# Patient Record
Sex: Female | Born: 1935 | Race: White | Hispanic: No | Marital: Married | Smoking: Never smoker
Health system: Southern US, Community
[De-identification: ages and names within clinical notes are randomized; demographics above are authoritative.]

## PROBLEM LIST (undated history)

## (undated) DIAGNOSIS — C089 Malignant neoplasm of major salivary gland, unspecified: Secondary | ICD-10-CM

## (undated) DIAGNOSIS — E785 Hyperlipidemia, unspecified: Secondary | ICD-10-CM

## (undated) DIAGNOSIS — M8580 Other specified disorders of bone density and structure, unspecified site: Secondary | ICD-10-CM

## (undated) DIAGNOSIS — C3490 Malignant neoplasm of unspecified part of unspecified bronchus or lung: Secondary | ICD-10-CM

## (undated) DIAGNOSIS — I10 Essential (primary) hypertension: Secondary | ICD-10-CM

## (undated) DIAGNOSIS — M1990 Unspecified osteoarthritis, unspecified site: Secondary | ICD-10-CM

## (undated) DIAGNOSIS — H9192 Unspecified hearing loss, left ear: Secondary | ICD-10-CM

## (undated) HISTORY — DX: Malignant neoplasm of unspecified part of unspecified bronchus or lung: C34.90

## (undated) HISTORY — DX: Unspecified hearing loss, left ear: H91.92

## (undated) HISTORY — PX: OTHER SURGICAL HISTORY: SHX169

## (undated) HISTORY — DX: Essential (primary) hypertension: I10

## (undated) HISTORY — DX: Hyperlipidemia, unspecified: E78.5

## (undated) HISTORY — PX: COLONOSCOPY: SHX174

## (undated) HISTORY — PX: CHOLECYSTECTOMY: SHX55

## (undated) HISTORY — DX: Malignant neoplasm of major salivary gland, unspecified: C08.9

## (undated) HISTORY — DX: Other specified disorders of bone density and structure, unspecified site: M85.80

## (undated) HISTORY — DX: Unspecified osteoarthritis, unspecified site: M19.90

## (undated) HISTORY — PX: CARDIAC VALVE REPLACEMENT: SHX585

---

## 1998-01-10 ENCOUNTER — Inpatient Hospital Stay (HOSPITAL_COMMUNITY): Admission: AD | Admit: 1998-01-10 | Discharge: 1998-01-12 | Payer: Self-pay | Admitting: *Deleted

## 1998-10-09 ENCOUNTER — Other Ambulatory Visit: Admission: RE | Admit: 1998-10-09 | Discharge: 1998-10-09 | Payer: Self-pay | Admitting: *Deleted

## 1999-12-30 ENCOUNTER — Encounter: Admission: RE | Admit: 1999-12-30 | Discharge: 1999-12-30 | Payer: Self-pay | Admitting: *Deleted

## 1999-12-30 ENCOUNTER — Encounter: Payer: Self-pay | Admitting: *Deleted

## 2000-09-21 ENCOUNTER — Ambulatory Visit (HOSPITAL_COMMUNITY): Admission: RE | Admit: 2000-09-21 | Discharge: 2000-09-21 | Payer: Self-pay | Admitting: Internal Medicine

## 2000-09-21 ENCOUNTER — Encounter: Payer: Self-pay | Admitting: Internal Medicine

## 2001-01-05 ENCOUNTER — Encounter: Admission: RE | Admit: 2001-01-05 | Discharge: 2001-01-05 | Payer: Self-pay | Admitting: Internal Medicine

## 2001-01-05 ENCOUNTER — Encounter: Payer: Self-pay | Admitting: Internal Medicine

## 2001-04-23 ENCOUNTER — Other Ambulatory Visit: Admission: RE | Admit: 2001-04-23 | Discharge: 2001-04-23 | Payer: Self-pay | Admitting: Neurosurgery

## 2002-01-06 ENCOUNTER — Encounter: Payer: Self-pay | Admitting: Internal Medicine

## 2002-01-06 ENCOUNTER — Encounter: Admission: RE | Admit: 2002-01-06 | Discharge: 2002-01-06 | Payer: Self-pay | Admitting: Internal Medicine

## 2002-08-11 ENCOUNTER — Other Ambulatory Visit: Admission: RE | Admit: 2002-08-11 | Discharge: 2002-08-11 | Payer: Self-pay | Admitting: Internal Medicine

## 2002-10-01 ENCOUNTER — Emergency Department (HOSPITAL_COMMUNITY): Admission: EM | Admit: 2002-10-01 | Discharge: 2002-10-01 | Payer: Self-pay | Admitting: Emergency Medicine

## 2002-10-01 ENCOUNTER — Encounter: Payer: Self-pay | Admitting: Emergency Medicine

## 2002-10-20 ENCOUNTER — Ambulatory Visit (HOSPITAL_BASED_OUTPATIENT_CLINIC_OR_DEPARTMENT_OTHER): Admission: RE | Admit: 2002-10-20 | Discharge: 2002-10-20 | Payer: Self-pay | Admitting: Urology

## 2003-01-12 ENCOUNTER — Encounter: Payer: Self-pay | Admitting: Internal Medicine

## 2003-01-12 ENCOUNTER — Encounter: Admission: RE | Admit: 2003-01-12 | Discharge: 2003-01-12 | Payer: Self-pay | Admitting: Internal Medicine

## 2004-01-15 ENCOUNTER — Encounter: Admission: RE | Admit: 2004-01-15 | Discharge: 2004-01-15 | Payer: Self-pay | Admitting: Internal Medicine

## 2004-12-23 ENCOUNTER — Ambulatory Visit: Payer: Self-pay | Admitting: Internal Medicine

## 2005-01-30 ENCOUNTER — Encounter: Admission: RE | Admit: 2005-01-30 | Discharge: 2005-01-30 | Payer: Self-pay | Admitting: Internal Medicine

## 2005-03-17 ENCOUNTER — Ambulatory Visit: Payer: Self-pay | Admitting: Internal Medicine

## 2005-04-10 ENCOUNTER — Other Ambulatory Visit: Admission: RE | Admit: 2005-04-10 | Discharge: 2005-04-10 | Payer: Self-pay | Admitting: Internal Medicine

## 2005-04-10 ENCOUNTER — Ambulatory Visit: Payer: Self-pay | Admitting: Internal Medicine

## 2005-10-13 ENCOUNTER — Ambulatory Visit: Payer: Self-pay | Admitting: Internal Medicine

## 2005-10-16 LAB — HM COLONOSCOPY

## 2006-02-02 ENCOUNTER — Encounter: Admission: RE | Admit: 2006-02-02 | Discharge: 2006-02-02 | Payer: Self-pay | Admitting: Internal Medicine

## 2006-04-13 ENCOUNTER — Ambulatory Visit: Payer: Self-pay | Admitting: Internal Medicine

## 2006-04-20 ENCOUNTER — Ambulatory Visit: Payer: Self-pay | Admitting: Gastroenterology

## 2006-05-04 ENCOUNTER — Ambulatory Visit: Payer: Self-pay | Admitting: Gastroenterology

## 2006-10-12 ENCOUNTER — Ambulatory Visit: Payer: Self-pay | Admitting: Internal Medicine

## 2006-10-12 LAB — CONVERTED CEMR LAB
ALT: 24 units/L (ref 0–40)
Alkaline Phosphatase: 62 units/L (ref 39–117)
BUN: 13 mg/dL (ref 6–23)
CO2: 30 meq/L (ref 19–32)
Calcium: 9.5 mg/dL (ref 8.4–10.5)
Chloride: 104 meq/L (ref 96–112)
Cholesterol: 207 mg/dL (ref 0–200)
Glucose, Bld: 92 mg/dL (ref 70–99)
LDL DIRECT: 117.1 mg/dL
Total Bilirubin: 0.9 mg/dL (ref 0.3–1.2)
Total Protein: 7.6 g/dL (ref 6.0–8.3)

## 2007-02-05 ENCOUNTER — Encounter: Admission: RE | Admit: 2007-02-05 | Discharge: 2007-02-05 | Payer: Self-pay | Admitting: Internal Medicine

## 2007-04-20 ENCOUNTER — Ambulatory Visit: Payer: Self-pay | Admitting: Internal Medicine

## 2007-04-20 LAB — CONVERTED CEMR LAB
Albumin: 4.3 g/dL (ref 3.5–5.2)
Alkaline Phosphatase: 52 units/L (ref 39–117)
Bilirubin, Direct: 0.2 mg/dL (ref 0.0–0.3)
Cholesterol: 184 mg/dL (ref 0–200)
HDL: 60.2 mg/dL (ref >39.0)
LDL Cholesterol: 110 mg/dL — ABNORMAL HIGH (ref 0–99)
Total Bilirubin: 1 mg/dL (ref 0.3–1.2)
Total CHOL/HDL Ratio: 3.1
Total Protein: 7.1 g/dL (ref 6.0–8.3)

## 2007-05-17 ENCOUNTER — Encounter: Payer: Self-pay | Admitting: Internal Medicine

## 2007-05-17 ENCOUNTER — Ambulatory Visit: Payer: Self-pay | Admitting: Family Medicine

## 2007-08-03 DIAGNOSIS — E785 Hyperlipidemia, unspecified: Secondary | ICD-10-CM | POA: Insufficient documentation

## 2007-08-03 DIAGNOSIS — M8580 Other specified disorders of bone density and structure, unspecified site: Secondary | ICD-10-CM

## 2007-08-03 DIAGNOSIS — M1990 Unspecified osteoarthritis, unspecified site: Secondary | ICD-10-CM | POA: Insufficient documentation

## 2007-10-07 DIAGNOSIS — C089 Malignant neoplasm of major salivary gland, unspecified: Secondary | ICD-10-CM

## 2007-10-07 HISTORY — DX: Malignant neoplasm of major salivary gland, unspecified: C08.9

## 2007-10-11 ENCOUNTER — Ambulatory Visit: Payer: Self-pay | Admitting: Internal Medicine

## 2007-10-11 ENCOUNTER — Other Ambulatory Visit: Admission: RE | Admit: 2007-10-11 | Discharge: 2007-10-11 | Payer: Self-pay | Admitting: Neurosurgery

## 2007-10-11 ENCOUNTER — Encounter: Payer: Self-pay | Admitting: Internal Medicine

## 2007-10-11 DIAGNOSIS — I10 Essential (primary) hypertension: Secondary | ICD-10-CM

## 2007-10-11 DIAGNOSIS — T887XXA Unspecified adverse effect of drug or medicament, initial encounter: Secondary | ICD-10-CM | POA: Insufficient documentation

## 2007-10-11 DIAGNOSIS — Z8679 Personal history of other diseases of the circulatory system: Secondary | ICD-10-CM | POA: Insufficient documentation

## 2007-10-11 DIAGNOSIS — T50905A Adverse effect of unspecified drugs, medicaments and biological substances, initial encounter: Secondary | ICD-10-CM | POA: Insufficient documentation

## 2007-10-11 LAB — CONVERTED CEMR LAB
ALT: 17 units/L (ref 0–35)
AST: 24 units/L (ref 0–37)
Alkaline Phosphatase: 54 units/L (ref 39–117)
Basophils Absolute: 0 10*3/uL (ref 0.0–0.1)
Cholesterol, target level: 200 mg/dL
Cholesterol: 212 mg/dL (ref 0–200)
Direct LDL: 126.5 mg/dL
Eosinophils Relative: 1.3 % (ref 0.0–5.0)
Hemoglobin: 13.9 g/dL (ref 12.0–15.0)
Neutro Abs: 3.8 10*3/uL (ref 1.4–7.7)
Neutrophils Relative %: 63.5 % (ref 43.0–77.0)
Platelets: 199 10*3/uL (ref 150–400)
RDW: 12.2 % (ref 11.5–14.6)
Total Bilirubin: 0.8 mg/dL (ref 0.3–1.2)
Total CHOL/HDL Ratio: 3.2
Total Protein: 7.1 g/dL (ref 6.0–8.3)

## 2007-10-25 ENCOUNTER — Ambulatory Visit: Payer: Self-pay | Admitting: Internal Medicine

## 2007-10-25 DIAGNOSIS — M26609 Unspecified temporomandibular joint disorder, unspecified side: Secondary | ICD-10-CM | POA: Insufficient documentation

## 2007-10-29 ENCOUNTER — Telehealth: Payer: Self-pay | Admitting: Internal Medicine

## 2008-02-07 ENCOUNTER — Encounter: Admission: RE | Admit: 2008-02-07 | Discharge: 2008-02-07 | Payer: Self-pay | Admitting: Internal Medicine

## 2008-03-09 ENCOUNTER — Ambulatory Visit: Payer: Self-pay | Admitting: Internal Medicine

## 2008-03-09 LAB — CONVERTED CEMR LAB
CO2: 27 meq/L (ref 19–32)
Chloride: 102 meq/L (ref 96–112)
Potassium: 4.1 meq/L (ref 3.5–5.1)

## 2008-03-13 ENCOUNTER — Ambulatory Visit: Payer: Self-pay | Admitting: Cardiology

## 2008-03-15 ENCOUNTER — Encounter: Payer: Self-pay | Admitting: Internal Medicine

## 2008-03-17 ENCOUNTER — Other Ambulatory Visit: Admission: RE | Admit: 2008-03-17 | Discharge: 2008-03-17 | Payer: Self-pay | Admitting: Otolaryngology

## 2008-03-17 ENCOUNTER — Encounter: Payer: Self-pay | Admitting: Internal Medicine

## 2008-05-10 ENCOUNTER — Ambulatory Visit: Payer: Self-pay | Admitting: Internal Medicine

## 2008-05-10 DIAGNOSIS — I881 Chronic lymphadenitis, except mesenteric: Secondary | ICD-10-CM

## 2008-05-10 LAB — CONVERTED CEMR LAB
CEA: 0.9 ng/mL (ref 0.0–5.0)
Eosinophils Relative: 1.7 % (ref 0.0–5.0)
HCT: 38 % (ref 36.0–46.0)
MCV: 92.1 fL (ref 78.0–100.0)
Monocytes Absolute: 0.5 10*3/uL (ref 0.1–1.0)
RBC: 4.13 M/uL (ref 3.87–5.11)
RDW: 12.5 % (ref 11.5–14.6)
WBC: 5.8 10*3/uL (ref 4.5–10.5)

## 2008-05-30 ENCOUNTER — Other Ambulatory Visit: Admission: RE | Admit: 2008-05-30 | Discharge: 2008-05-30 | Payer: Self-pay | Admitting: Otolaryngology

## 2008-06-30 ENCOUNTER — Encounter: Admission: RE | Admit: 2008-06-30 | Discharge: 2008-06-30 | Payer: Self-pay | Admitting: Otolaryngology

## 2008-08-30 ENCOUNTER — Encounter: Payer: Self-pay | Admitting: Internal Medicine

## 2008-09-04 ENCOUNTER — Encounter: Payer: Self-pay | Admitting: Internal Medicine

## 2008-09-06 ENCOUNTER — Encounter: Payer: Self-pay | Admitting: Internal Medicine

## 2008-09-10 ENCOUNTER — Encounter: Payer: Self-pay | Admitting: Internal Medicine

## 2008-09-18 ENCOUNTER — Encounter: Payer: Self-pay | Admitting: Internal Medicine

## 2008-09-19 ENCOUNTER — Ambulatory Visit: Admission: RE | Admit: 2008-09-19 | Discharge: 2008-12-18 | Payer: Self-pay | Admitting: Radiation Oncology

## 2008-09-20 ENCOUNTER — Encounter: Payer: Self-pay | Admitting: Internal Medicine

## 2008-09-22 ENCOUNTER — Ambulatory Visit: Payer: Self-pay | Admitting: Dentistry

## 2008-09-22 ENCOUNTER — Encounter: Admission: RE | Admit: 2008-09-22 | Discharge: 2008-09-22 | Payer: Self-pay | Admitting: Dentistry

## 2008-10-13 ENCOUNTER — Ambulatory Visit: Payer: Self-pay | Admitting: Internal Medicine

## 2008-10-13 LAB — CONVERTED CEMR LAB
Basophils Absolute: 0 10*3/uL (ref 0.0–0.1)
Basophils Relative: 0.6 % (ref 0.0–3.0)
CO2: 29 meq/L (ref 19–32)
Chloride: 109 meq/L (ref 96–112)
Creatinine, Ser: 0.9 mg/dL (ref 0.4–1.2)
Eosinophils Absolute: 0.2 10*3/uL (ref 0.0–0.7)
Eosinophils Relative: 3 % (ref 0.0–5.0)
Glucose, Bld: 98 mg/dL (ref 70–99)
HDL: 62.5 mg/dL (ref >39.0)
Hemoglobin: 13.5 g/dL (ref 12.0–15.0)
LDL Cholesterol: 113 mg/dL — ABNORMAL HIGH (ref 0–99)
MCHC: 35.7 g/dL (ref 30.0–36.0)
Monocytes Relative: 7 % (ref 3.0–12.0)
Neutro Abs: 3.5 10*3/uL (ref 1.4–7.7)
Sodium: 142 meq/L (ref 135–145)
TSH: 0.84 microintl units/mL (ref 0.35–5.50)
Total Bilirubin: 0.7 mg/dL (ref 0.3–1.2)
Total Protein: 7.3 g/dL (ref 6.0–8.3)
WBC: 5.5 10*3/uL (ref 4.5–10.5)

## 2008-10-18 ENCOUNTER — Encounter: Payer: Self-pay | Admitting: Internal Medicine

## 2008-11-08 ENCOUNTER — Ambulatory Visit: Payer: Self-pay | Admitting: Dentistry

## 2008-12-19 ENCOUNTER — Encounter: Payer: Self-pay | Admitting: Internal Medicine

## 2008-12-20 ENCOUNTER — Encounter: Payer: Self-pay | Admitting: Internal Medicine

## 2008-12-26 ENCOUNTER — Encounter: Payer: Self-pay | Admitting: Internal Medicine

## 2009-01-02 ENCOUNTER — Ambulatory Visit: Payer: Self-pay | Admitting: Dentistry

## 2009-01-17 ENCOUNTER — Encounter: Payer: Self-pay | Admitting: Internal Medicine

## 2009-02-07 ENCOUNTER — Encounter: Admission: RE | Admit: 2009-02-07 | Discharge: 2009-02-07 | Payer: Self-pay | Admitting: Internal Medicine

## 2009-02-26 ENCOUNTER — Encounter: Payer: Self-pay | Admitting: Internal Medicine

## 2009-04-04 ENCOUNTER — Encounter: Payer: Self-pay | Admitting: Cardiology

## 2009-04-04 ENCOUNTER — Ambulatory Visit: Payer: Self-pay | Admitting: Internal Medicine

## 2009-04-04 DIAGNOSIS — C07 Malignant neoplasm of parotid gland: Secondary | ICD-10-CM | POA: Insufficient documentation

## 2009-04-11 ENCOUNTER — Encounter: Payer: Self-pay | Admitting: Internal Medicine

## 2009-04-20 DIAGNOSIS — Z952 Presence of prosthetic heart valve: Secondary | ICD-10-CM

## 2009-04-20 DIAGNOSIS — Z9889 Other specified postprocedural states: Secondary | ICD-10-CM | POA: Insufficient documentation

## 2009-04-27 ENCOUNTER — Encounter: Payer: Self-pay | Admitting: Internal Medicine

## 2009-05-14 ENCOUNTER — Encounter: Payer: Self-pay | Admitting: Cardiology

## 2009-05-24 ENCOUNTER — Encounter (HOSPITAL_COMMUNITY): Admission: RE | Admit: 2009-05-24 | Discharge: 2009-06-13 | Payer: Self-pay | Admitting: Cardiology

## 2009-05-29 ENCOUNTER — Encounter: Payer: Self-pay | Admitting: Internal Medicine

## 2009-05-30 ENCOUNTER — Telehealth: Payer: Self-pay | Admitting: Internal Medicine

## 2009-06-06 ENCOUNTER — Encounter: Payer: Self-pay | Admitting: Internal Medicine

## 2009-06-17 ENCOUNTER — Encounter: Payer: Self-pay | Admitting: Internal Medicine

## 2009-06-17 ENCOUNTER — Encounter (INDEPENDENT_AMBULATORY_CARE_PROVIDER_SITE_OTHER): Payer: Self-pay | Admitting: *Deleted

## 2009-07-04 ENCOUNTER — Ambulatory Visit: Payer: Self-pay | Admitting: Internal Medicine

## 2009-07-04 LAB — CONVERTED CEMR LAB
HDL goal, serum: 40 mg/dL
LDL Goal: 100 mg/dL

## 2009-09-05 ENCOUNTER — Encounter (INDEPENDENT_AMBULATORY_CARE_PROVIDER_SITE_OTHER): Payer: Self-pay | Admitting: *Deleted

## 2009-09-05 ENCOUNTER — Encounter: Payer: Self-pay | Admitting: Internal Medicine

## 2009-09-19 ENCOUNTER — Encounter: Payer: Self-pay | Admitting: Internal Medicine

## 2009-09-25 ENCOUNTER — Ambulatory Visit: Payer: Self-pay | Admitting: Internal Medicine

## 2009-09-25 LAB — CONVERTED CEMR LAB
Alkaline Phosphatase: 49 units/L (ref 39–117)
Basophils Absolute: 0 10*3/uL (ref 0.0–0.1)
Basophils Relative: 0.9 % (ref 0.0–3.0)
Bilirubin, Direct: 0 mg/dL (ref 0.0–0.3)
Direct LDL: 116 mg/dL
Eosinophils Absolute: 0.1 10*3/uL (ref 0.0–0.7)
Eosinophils Relative: 2.7 % (ref 0.0–5.0)
HDL: 62.5 mg/dL (ref >39.00)
Hemoglobin: 12.5 g/dL (ref 12.0–15.0)
Lymphs Abs: 0.8 10*3/uL (ref 0.7–4.0)
Monocytes Relative: 9.3 % (ref 3.0–12.0)
Platelets: 189 10*3/uL (ref 150.0–400.0)

## 2009-11-27 ENCOUNTER — Ambulatory Visit: Payer: Self-pay | Admitting: Internal Medicine

## 2009-12-29 ENCOUNTER — Encounter: Payer: Self-pay | Admitting: Internal Medicine

## 2009-12-29 ENCOUNTER — Emergency Department (HOSPITAL_COMMUNITY)
Admission: EM | Admit: 2009-12-29 | Discharge: 2009-12-29 | Payer: Self-pay | Source: Home / Self Care | Admitting: Emergency Medicine

## 2010-01-07 ENCOUNTER — Ambulatory Visit: Payer: Self-pay | Admitting: Internal Medicine

## 2010-01-07 DIAGNOSIS — R55 Syncope and collapse: Secondary | ICD-10-CM | POA: Insufficient documentation

## 2010-01-07 DIAGNOSIS — I498 Other specified cardiac arrhythmias: Secondary | ICD-10-CM

## 2010-01-07 DIAGNOSIS — I499 Cardiac arrhythmia, unspecified: Secondary | ICD-10-CM | POA: Insufficient documentation

## 2010-01-07 DIAGNOSIS — R001 Bradycardia, unspecified: Secondary | ICD-10-CM | POA: Insufficient documentation

## 2010-01-09 ENCOUNTER — Encounter: Payer: Self-pay | Admitting: Internal Medicine

## 2010-02-18 ENCOUNTER — Encounter: Admission: RE | Admit: 2010-02-18 | Discharge: 2010-02-18 | Payer: Self-pay | Admitting: Internal Medicine

## 2010-02-20 ENCOUNTER — Encounter: Payer: Self-pay | Admitting: Internal Medicine

## 2010-02-26 ENCOUNTER — Ambulatory Visit: Payer: Self-pay | Admitting: Internal Medicine

## 2010-03-29 ENCOUNTER — Encounter: Payer: Self-pay | Admitting: Internal Medicine

## 2010-05-13 ENCOUNTER — Ambulatory Visit: Payer: Self-pay | Admitting: Internal Medicine

## 2010-05-13 LAB — CONVERTED CEMR LAB
Albumin: 4.3 g/dL (ref 3.5–5.2)
Alkaline Phosphatase: 49 units/L (ref 39–117)
BUN: 16 mg/dL (ref 6–23)
Basophils Absolute: 0 10*3/uL (ref 0.0–0.1)
Basophils Relative: 0.4 % (ref 0.0–3.0)
Bilirubin, Direct: 0.1 mg/dL (ref 0.0–0.3)
Chloride: 103 meq/L (ref 96–112)
Creatinine, Ser: 0.8 mg/dL (ref 0.4–1.2)
Direct LDL: 193.4 mg/dL
GFR calc non Af Amer: 70.69 mL/min (ref >60)
HCT: 37.4 % (ref 36.0–46.0)
Hemoglobin: 12.8 g/dL (ref 12.0–15.0)
MCHC: 34.2 g/dL (ref 30.0–36.0)
MCV: 92.7 fL (ref 78.0–100.0)
Neutrophils Relative %: 63.7 % (ref 43.0–77.0)
Platelets: 171 10*3/uL (ref 150.0–400.0)
Potassium: 4.1 meq/L (ref 3.5–5.1)
Total Bilirubin: 0.6 mg/dL (ref 0.3–1.2)
WBC: 4.2 10*3/uL — ABNORMAL LOW (ref 4.5–10.5)

## 2010-05-28 ENCOUNTER — Ambulatory Visit: Payer: Self-pay | Admitting: Internal Medicine

## 2010-07-15 ENCOUNTER — Encounter: Payer: Self-pay | Admitting: Internal Medicine

## 2010-08-21 ENCOUNTER — Ambulatory Visit: Payer: Self-pay | Admitting: Internal Medicine

## 2010-08-21 LAB — CONVERTED CEMR LAB
Albumin: 4.2 g/dL (ref 3.5–5.2)
Bilirubin, Direct: 0.1 mg/dL (ref 0.0–0.3)
Direct LDL: 123.6 mg/dL
Total CHOL/HDL Ratio: 3
Triglycerides: 30 mg/dL (ref 0.0–149.0)
VLDL: 6 mg/dL (ref 0.0–40.0)

## 2010-08-27 ENCOUNTER — Ambulatory Visit: Payer: Self-pay | Admitting: Internal Medicine

## 2010-10-17 ENCOUNTER — Telehealth: Payer: Self-pay | Admitting: Internal Medicine

## 2010-10-18 ENCOUNTER — Encounter: Payer: Self-pay | Admitting: Internal Medicine

## 2010-10-18 ENCOUNTER — Ambulatory Visit
Admission: RE | Admit: 2010-10-18 | Discharge: 2010-10-18 | Payer: Self-pay | Source: Home / Self Care | Attending: Internal Medicine | Admitting: Internal Medicine

## 2010-10-18 LAB — CONVERTED CEMR LAB: Vit D, 25-Hydroxy: 51 ng/mL (ref 30–89)

## 2010-11-03 LAB — CONVERTED CEMR LAB
Cholesterol: 199 mg/dL (ref 0–200)
HDL: 62.2 mg/dL (ref >39.00)

## 2010-11-07 NOTE — Progress Notes (Signed)
Summary: Ergocalciferol  Phone Note Refill Request Message from:  Fax from [facility] [phone] on October 17, 2010 12:53 PM  Refills Requested: Medication #1:  ERGOCALCIFEROL 50000 UNIT CAPS one by mouth weekly for 3 month   Dosage confirmed as above?Dosage Confirmed   Supply Requested: 1 month   Last Refilled: 09/18/2010 Refill in August stated pt was to take it for 3 months. Pt has had it filled every month since then. Do you want pt to contine taking it?  Initial call taken by: Mervin Kung CMA Duncan Dull),  October 17, 2010 12:57 PM  Follow-up for Phone Call        do not refill schedule for vit d level Follow-up by: Stacie Glaze MD,  October 17, 2010 1:16 PM  Additional Follow-up for Phone Call Additional follow up Details #1::        [facility] Additional Follow-up by: Lynann Beaver CMA [facility],  October 17, 2010 2:16 PM    Additional Follow-up for Phone Call Additional follow up Details #2::    Scheduled labs Follow-up by: [facility] CMA [facility],  October 17, 2010 3:44 PM

## 2010-11-07 NOTE — Assessment & Plan Note (Signed)
Summary: 2 mo rov/mm   Vital Signs:  Patient profile:   76 year old female Height:      64 inches Weight:      126 pounds BMI:     21.71 Temp:     98.2 degrees F oral Pulse rate:   72 / minute Resp:     14 per minute BP sitting:   116 / 74  (left arm)  Vitals Entered By: Willy Eddy, LPN (November 27, 2009 9:49 AM) CC: roa, Hypertension Management   CC:  roa and Hypertension Management.  History of Present Illness: monitered lipids and vit d levels at [facility] with follow up has not started Rehab... but has been walking and  exercizing at home  Hypertension History:      She denies headache, chest pain, palpitations, dyspnea with exertion, orthopnea, PND, peripheral edema, visual symptoms, neurologic problems, syncope, and side effects from treatment.        Positive major cardiovascular risk factors include female age 8 years old or older, hyperlipidemia, and hypertension.  Negative major cardiovascular risk factors include non-tobacco-user status.        Positive history for target organ damage include ASHD (either angina/prior MI/prior CABG).  Further assessment for target organ damage reveals no history of stroke/TIA or peripheral vascular disease.     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 Once Daily 2)  Aspirin 325 Mg Tabs (Aspirin) .Marland Kitchen.. 1 Once Daily 3)  Fish Oil Concentrate 1000 Mg  Caps (Omega-3 Fatty Acids) .... Two By Mouth Daily 4)  Multaq 400 Mg Tabs (Dronedarone Hcl) .Marland Kitchen.. 1 Two Times A Day 5)  Metoprolol Tartrate 25 Mg Tabs (Metoprolol Tartrate) .... 1.5 Two Times A Day 6)  Ergocalciferol 50000 Unit Caps (Ergocalciferol) .... One By Mouth Weekly For 3 Month  Current Medications (verified): 1)  Lipitor 20 Mg  Tabs (Atorvastatin Calcium) .... Once Daily 2)  Aspirin 325 Mg Tabs (Aspirin) .Marland Kitchen.. 1 Once Daily 3)  Fish Oil Concentrate 1000 Mg  Caps (Omega-3 Fatty Acids) .... Two By Mouth Daily 4)  Multaq 400 Mg Tabs (Dronedarone Hcl) .Marland Kitchen.. 1 Two Times A Day 5)  Metoprolol Tartrate 25 Mg Tabs (Metoprolol Tartrate) .... 1.5 Two Times A Day 6)  Ergocalciferol 50000 Unit Caps (Ergocalciferol) .... One By Mouth Weekly For 3 Month  Allergies (verified): 1)  ! * Evista  Past History:  Family History: Last updated: 08/03/2007 Family History of Stroke F 1st degree relative <60 Family History of Stroke M 1st degree relative <50  Social History: Last updated:  08/03/2007 Retired Married Never Smoked  Risk Factors: Smoking Status: never (11/27/2009)  Past medical, surgical, family and social histories (including risk factors) reviewed, and no changes noted (except as noted below).  Past Medical History: Reviewed history from 08/03/2007 and no changes required. Hyperlipidemia Osteoarthritis Osteopenia  Past Surgical History: Reviewed history from 10/13/2008 and no changes required. Colonoscopy Cholecystectomy salivary  gland surgery for cancer on the left parotid  Family History: Reviewed history from 08/03/2007 and no changes required. Family History of Stroke F 1st degree relative <60 Family History of Stroke M 1st degree relative <50  Social History: Reviewed history from 08/03/2007 and no changes required. Retired Married Never Smoked  Review of Systems  The patient denies anorexia, fever, weight loss, weight gain, vision loss, decreased hearing, hoarseness, chest pain, syncope, dyspnea on exertion, peripheral edema, prolonged cough, headaches, hemoptysis, abdominal pain, melena, hematochezia, severe indigestion/heartburn, hematuria, incontinence, genital sores, muscle weakness, suspicious skin lesions, transient blindness, difficulty walking, depression, unusual weight change, abnormal bleeding, enlarged lymph nodes, angioedema, and breast masses.    Physical Exam  General:  Well-developed,well-nourished,in no acute distress; alert,appropriate and cooperative throughout examination Head:  post surgical changes Eyes:  pupils equal and pupils round.   Ears:  R ear normal and L ear normal.   Lungs:  normal respiratory effort and no wheezes.   Heart:  normal rate and regular rhythm.   Abdomen:  Bowel sounds positive,abdomen soft and non-tender without masses, organomegaly or hernias noted. Extremities:  trace left pedal edema and trace right pedal edema.   Neurologic:  alert & oriented X3 and finger-to-nose normal.     Impression & Recommendations:  Problem # 1:  HYPERTENSION, ESSENTIAL (ICD-401.9) Assessment Unchanged  Her updated medication list for this problem includes:    Metoprolol Tartrate 25 Mg Tabs (Metoprolol tartrate) .Marland Kitchen... 1.5 two times a day  BP today: 116/74 Prior BP: 110/70 (09/25/2009)  Prior 10 Yr Risk Heart Disease: N/A (07/04/2009)  Labs Reviewed: K+: 4.5 (10/13/2008) Creat: : 0.9 (10/13/2008)   Chol: 197 (09/25/2009)   HDL: 62.50 (09/25/2009)   LDL: 113 (10/13/2008)    TG: 76 (10/13/2008)  Problem # 2:  OSTEOPENIA (ICD-733.90) Assessment: Unchanged  on the ergocalciferol  Discussed medication use, applications of heat or ice, and exercises.   Orders: Venipuncture (27253) T-Vitamin D (25-Hydroxy) ([id number])  Problem # 3:  HYPERLIPIDEMIA (ICD-272.4) Assessment: Unchanged at goal Her updated medication list for this problem includes:    Lipitor 20 Mg Tabs (Atorvastatin calcium) ..... Once daily  Labs Reviewed: SGOT: 23 (09/25/2009)   SGPT: 18 (09/25/2009)  Lipid Goals: Chol Goal: 200 (07/04/2009)   HDL Goal: 40 (07/04/2009)   LDL Goal: 100 (07/04/2009)   TG Goal: 150 (07/04/2009)  Prior 10 Yr Risk Heart Disease: N/A (07/04/2009)   HDL:62.50 (09/25/2009), 62.20 (04/04/2009)  LDL:113 (10/13/2008), DEL (10/11/2007)  Chol:197 (09/25/2009), 199 (04/04/2009)  Trig:76 (10/13/2008), 42 (10/11/2007)  Complete Medication List: 1)  Lipitor 20 Mg Tabs (Atorvastatin calcium) .... Once daily 2)  Aspirin 325 Mg Tabs (Aspirin) .Marland Kitchen.. 1 once daily 3)  Fish Oil Concentrate 1000 Mg Caps (Omega-3 fatty acids) .... Two by mouth daily 4)  Multaq 400 Mg Tabs (Dronedarone hcl) .Marland Kitchen.. 1 two times a day 5)  Metoprolol Tartrate 25 Mg Tabs (Metoprolol tartrate) .... 1.5 two times a day 6)  Ergocalciferol 50000 Unit Caps (Ergocalciferol) .... One by mouth weekly for 3 month 7)  Ergocalciferol 50000 Unit Caps (Ergocalciferol) .... One by mouth weekly  Hypertension Assessment/Plan:  The patient's hypertensive risk group is category C: Target organ damage and/or diabetes.  Today's blood pressure is 116/74.  Her blood pressure goal is < 140/90.  Patient Instructions: 1)  Please schedule a follow-up appointment in 3 months.

## 2010-11-07 NOTE — Letter (Signed)
Summary: [facility]  [facility]   Imported By: Maryln Gottron 07/18/2010 13:51:56  _____________________________________________________________________  External Attachment:    Type:   Image     Comment:   External Document

## 2010-11-07 NOTE — Assessment & Plan Note (Signed)
Summary: 3 MONTH ROV/NJR   Vital Signs:  Patient profile:   76 year old female Height:      64 inches Weight:      124 pounds BMI:     21.36 Temp:     97.6 degrees F oral Pulse rate:   72 / minute Resp:     12 per minute BP sitting:   110 / 70  (left arm)  Vitals Entered By: Willy Eddy, LPN (Feb 26, 2010 9:25 AM) CC: roa, Lipid Management   CC:  roa and Lipid Management.  History of Present Illness: pt is on 50,000 international units of ergocalciferol and needs a monitering lab has been post head and neck ca  post treatment  Follow-Up Visit      This is a 76 year old woman who presents for Follow-up visit.  for HTN and osteoporosis.  The patient denies chest pain, palpitations, dizziness, syncope, low blood sugar symptoms, high blood sugar symptoms, edema, SOB, DOE, PND, and orthopnea.  Since the last visit the patient notes no new problems or concerns and being seen by a specialist.  The patient reports taking meds as prescribed and monitoring BP.  When questioned about possible medication side effects, the patient notes none.    Lipid Management History:      Positive NCEP/ATP III risk factors include female age 46 years old or older, hypertension, and ASHD (either angina/prior MI/prior CABG).  Negative NCEP/ATP III risk factors include HDL cholesterol greater than 60, non-tobacco-user status, no prior stroke/TIA, no peripheral vascular disease, and no history of aortic aneurysm.     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) 4)  Aortic Valve Replacement, Hx of  (ICD-V43.3) 5)  Chronic Lymphadenitis  (ICD-289.1) 6)  Temporomandibular Joint Disorder  (ICD-524.60) 7)  Hypertension, Essential  (ICD-401.9) 8)  Uns Advrs Eff Uns Rx Medicinal&biological Sbstnc  (ICD-995.20) 9)  Osteopenia   (ICD-733.90) 10)  Osteoarthritis  (ICD-715.90) 11)  Hyperlipidemia  (ICD-272.4)  Current Problems (verified): 1)  Syncope  (ICD-780.2) 2)  Bradycardia  (ICD-427.89) 3)  Neoplasm, Malignant, 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 Once Daily 2)  Aspirin 325 Mg Tabs (Aspirin) .Marland Kitchen.. 1 Once Daily 3)  Fish Oil Concentrate 1000 Mg  Caps (Omega-3 Fatty Acids) .... Two By Mouth Daily 4)  Multaq 400 Mg Tabs (Dronedarone Hcl) .Marland Kitchen.. 1 Two Times A Day 5)  Metoprolol Tartrate 25 Mg Tabs (Metoprolol Tartrate) .... 1.5 Two Times A Day 6)  Ergocalciferol 50000 Unit Caps (Ergocalciferol) .... One By Mouth Weekly For 3 Month  Current Medications (verified): 1)  Lipitor 20 Mg  Tabs (Atorvastatin Calcium) .... Once Daily 2)  Aspirin 325 Mg Tabs (Aspirin) .Marland Kitchen.. 1 Once Daily 3)  Fish Oil Concentrate 1000 Mg  Caps (Omega-3 Fatty Acids) .... Two By Mouth Daily 4)  Metoprolol Tartrate 25 Mg Tabs (Metoprolol Tartrate) .... 1.5 Two Times A Day 5)  Ergocalciferol 50000 Unit Caps (Ergocalciferol) .... One  By Mouth Weekly For 3 Month  Allergies (verified): 1)  ! * Evista  Past History:  Family History: Last updated: 08/03/2007 Family History of Stroke F 1st degree relative <60 Family History of Stroke M 1st degree relative <50  Social History: Last updated: 08/03/2007 Retired Married Never Smoked  Risk Factors: Smoking Status: never (02/26/2010)  Past medical, surgical, family and social histories (including risk factors) reviewed, and no changes noted (except as noted below).  Past Medical  History: Reviewed history from 08/03/2007 and no changes required. Hyperlipidemia Osteoarthritis Osteopenia  Past Surgical History: Reviewed history from 10/13/2008 and no changes required. Colonoscopy Cholecystectomy salivary gland surgery for cancer on the left parotid  Family History: Reviewed history from 08/03/2007 and no changes required. Family History of Stroke F 1st degree relative <60 Family History of Stroke M 1st degree relative <50  Social History: Reviewed history from 08/03/2007 and no changes required. Retired Married Never Smoked  Review of Systems  The patient denies anorexia, fever, weight loss, weight gain, vision loss, decreased hearing, hoarseness, chest pain, syncope, dyspnea on exertion, peripheral edema, prolonged cough, headaches, hemoptysis, abdominal pain, melena, hematochezia, severe indigestion/heartburn, hematuria, incontinence, genital sores, muscle weakness, suspicious skin lesions, transient blindness, difficulty walking, depression, unusual weight change, abnormal bleeding, enlarged lymph nodes, angioedema, and breast masses.    Physical Exam  General:  Well-developed,well-nourished,in no acute distress; alert,appropriate and cooperative throughout examination Head:  post surgical changes Eyes:  pupils equal and pupils round.   Ears:  R ear normal and L ear normal.   Nose:  External nasal examination shows no deformity or inflammation. Nasal mucosa are pink and moist without lesions or exudates. Mouth:  scar on the left  extending from the ear to the neck Lungs:  normal respiratory effort and no wheezes.   Heart:  bradycardia and Grade   1/6 systolic ejection murmur.     Impression & Recommendations:  Problem # 1:  HYPERLIPIDEMIA (ICD-272.4)  Her updated medication list for this problem includes:    Lipitor 20 Mg Tabs (Atorvastatin calcium) ..... Once daily  Labs Reviewed: SGOT: 23 (09/25/2009)   SGPT: 18 (09/25/2009)  Lipid  Goals: Chol Goal: 200 (07/04/2009)   HDL Goal: 40 (07/04/2009)   LDL Goal: 100 (07/04/2009)   TG Goal: 150 (07/04/2009)  Prior 10 Yr Risk Heart Disease: N/A (07/04/2009)   HDL:62.50 (09/25/2009), 62.20 (04/04/2009)  LDL:113 (10/13/2008), DEL (10/11/2007)  Chol:197 (09/25/2009), 199 (04/04/2009)  Trig:76 (10/13/2008), 42 (10/11/2007)  Problem # 2:  OSTEOPENIA (ICD-733.90)  will defer the bone density until after all of the more frequent pet scan are done for the head and neck cancer will check the vit d today  Discussed medication use, applications of heat or ice, and exercises.   Orders: Venipuncture (16109) T-Vitamin D (25-Hydroxy) ([id number])  Problem # 3:  HYPERTENSION, ESSENTIAL (ICD-401.9)  Her updated medication list for this problem includes:    Metoprolol Tartrate 25 Mg Tabs (Metoprolol tartrate) .Marland Kitchen... 1.5 two times a day  BP today: 110/70 Prior BP: 120/70 (01/07/2010)  Prior 10 Yr Risk Heart Disease: N/A (07/04/2009)  Labs Reviewed: K+: 4.5 (10/13/2008) Creat: : 0.9 (10/13/2008)   Chol: 197 (09/25/2009)   HDL: 62.50 (09/25/2009)   LDL: 113 (10/13/2008)   TG: 76 (10/13/2008)  Complete Medication List: 1)  Lipitor 20 Mg Tabs (Atorvastatin calcium) .... Once daily 2)  Aspirin 325 Mg Tabs (Aspirin) .Marland Kitchen.. 1 once daily 3)  Fish Oil Concentrate 1000 Mg Caps (Omega-3 fatty acids) .... Two by mouth  daily 4)  Metoprolol Tartrate 25 Mg Tabs (Metoprolol tartrate) .... 1.5 two times a day 5)  Ergocalciferol 50000 Unit Caps (Ergocalciferol) .... One by mouth weekly for 3 month  Lipid Assessment/Plan:      Based on NCEP/ATP III, the patient's risk factor category is "history of coronary disease, peripheral vascular disease, cerebrovascular disease, or aortic aneurysm".  The patient's lipid goals are as follows: Total cholesterol goal is 200; LDL cholesterol goal is 100; HDL cholesterol goal is 40; Triglyceride goal is 150.  Her LDL cholesterol goal has not been met.  Secondary  causes for hyperlipidemia have been ruled out.  She has been counseled on adjunctive measures for lowering her cholesterol and has been provided with dietary instructions.    Patient Instructions: 1)  Please schedule a follow-up appointment in 3 months. 2)  BMP prior to visit, ICD-9:401.9 3)  Hepatic Panel prior to visit, ICD-9:995.20 4)  Lipid Panel prior to visit, ICD-9:272.4 5)  TSH prior to visit, ICD-9:272.4 6)  CBC w/ Diff prior to visit, ICD-9:995.20

## 2010-11-07 NOTE — Assessment & Plan Note (Signed)
Summary: 3 month fup//ccm   Vital Signs:  Patient profile:   76 year old female Height:      64 inches Weight:      130 pounds BMI:     22.40 Temp:     98.2 degrees F oral Pulse rate:   72 / minute Resp:     14 per minute BP sitting:   110 / 70  (left arm)  Vitals Entered By: Willy Eddy, LPN (August 27, 2010 9:42 AM) CC: roa labs, Lipid Management Is Patient Diabetic? No   Primary Care Provider:  Stacie Glaze MD  CC:  roa labs and Lipid Management.  History of Present Illness:  Hyperlipidemia Follow-Up      This is a 76 year old woman who presents for Hyperlipidemia follow-up.  The patient reports muscle aches, but denies GI upset, abdominal pain, flushing, itching, constipation, diarrhea, and fatigue.  The patient denies the following symptoms: chest pain/pressure, exercise intolerance, dypsnea, palpitations, syncope, and pedal edema.  Compliance with medications (by patient report) has been near 100%.  Dietary compliance has been good.  The patient reports exercising 3-4X per week.  Adjunctive measures currently used by the patient include Co-QA.    Lipid Management History:      Positive NCEP/ATP III risk factors include female age 74 years old or older, hypertension, and ASHD (either angina/prior MI/prior CABG).  Negative NCEP/ATP III risk factors include non-tobacco-user status, no prior stroke/TIA, no peripheral vascular disease, and no history of aortic aneurysm.     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) 4)  Aortic Valve Replacement, Hx of  (ICD-V43.3) 5)  Chronic Lymphadenitis  (ICD-289.1) 6)  Temporomandibular Joint Disorder  (ICD-524.60) 7)  Hypertension, Essential  (ICD-401.9) 8)  Uns Advrs Eff Uns Rx Medicinal&biological  Sbstnc  (ICD-995.20) 9)  Osteopenia  (ICD-733.90) 10)  Osteoarthritis  (ICD-715.90) 11)  Hyperlipidemia  (ICD-272.4)  Current Problems (verified): 1)  Syncope  (ICD-780.2) 2)  Bradycardia  (ICD-427.89) 3)  Neoplasm, Malignant, 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 Once Daily 2)  Aspirin 325 Mg Tabs (Aspirin) .Marland Kitchen.. 1 Once Daily 3)  Fish Oil Concentrate 1000 Mg  Caps (Omega-3 Fatty Acids) .... Two By Mouth Daily 4)  Metoprolol Tartrate 25 Mg Tabs (Metoprolol Tartrate) .... 1.5 Two Times A Day 5)  Ergocalciferol 50000 Unit Caps (Ergocalciferol) .... One By Mouth Weekly For 3 Month  Current Medications (verified): 1)  Lipitor 20 Mg  Tabs (Atorvastatin Calcium) .... Once Daily 2)  Aspirin 325 Mg Tabs (Aspirin) .Marland Kitchen.. 1 Once Daily 3)  Fish Oil Concentrate 1000 Mg  Caps (Omega-3 Fatty Acids) .... Two By Mouth Daily 4)  Metoprolol Tartrate 25 Mg Tabs (Metoprolol Tartrate) .... 1.5 Two Times A Day 5)  Ergocalciferol 50000 Unit Caps (Ergocalciferol) .... One By Mouth Weekly For 3 Month  Allergies (verified): 1)  ! * Evista  Past  History:  Family History: Last updated: 08/03/2007 Family History of Stroke F 1st degree relative <60 Family History of Stroke M 1st degree relative <50  Social History: Last updated: 08/03/2007 Retired Married Never Smoked  Risk Factors: Smoking Status: never (08/27/2010)  Past medical, surgical, family and social histories (including risk factors) reviewed, and no changes noted (except as noted below).  Past Medical History: Reviewed history from  08/03/2007 and no changes required. Hyperlipidemia Osteoarthritis Osteopenia  Past Surgical History: Reviewed history from 10/13/2008 and no changes required. Colonoscopy Cholecystectomy salivary gland surgery for cancer on the left parotid  Family History: Reviewed history from 08/03/2007 and no changes required. Family History of Stroke F 1st degree relative <60 Family History of Stroke M 1st degree relative <50  Social History: Reviewed history from 08/03/2007 and no changes required. Retired Married Never Smoked  Review of Systems  The patient denies anorexia, fever, weight loss, weight gain, vision loss, decreased hearing, hoarseness, chest pain, syncope, dyspnea on exertion, peripheral edema, prolonged cough, headaches, hemoptysis, abdominal pain, melena, hematochezia, severe indigestion/heartburn, hematuria, incontinence, genital sores, muscle weakness, suspicious skin lesions, transient blindness, difficulty walking, depression, unusual weight change, abnormal bleeding, enlarged lymph nodes, angioedema, and breast masses.    Physical Exam  General:  Well-developed,well-nourished,in no acute distress; alert,appropriate and cooperative throughout examination Head:  post surgical changes Eyes:  pupils equal and pupils round.   Ears:  R ear normal and L ear normal.   Nose:  External nasal examination shows no deformity or inflammation. Nasal mucosa are pink and moist without lesions or exudates. Mouth:  scar on the left  extending from the ear to the neck Lungs:  normal respiratory effort and no wheezes.   Heart:  bradycardia and Grade   1/6 systolic ejection murmur.   Abdomen:  Bowel sounds positive,abdomen soft and non-tender without masses, organomegaly or hernias noted.   Impression & Recommendations:  Problem # 1:  HYPERTENSION, ESSENTIAL (ICD-401.9) Assessment Unchanged  Her updated medication list for this problem includes:    Metoprolol Tartrate 25 Mg Tabs  (Metoprolol tartrate) .Marland Kitchen... 1.5 two times a day  BP today: 110/70 Prior BP: 130/80 (05/28/2010)  Prior 10 Yr Risk Heart Disease: N/A (07/04/2009)  Labs Reviewed: K+: 4.1 (05/13/2010) Creat: : 0.8 (05/13/2010)   Chol: 283 (05/13/2010)   HDL: 57.60 (05/13/2010)   LDL: 113 (10/13/2008)   TG: 103.0 (05/13/2010)  Problem # 2:  HYPERLIPIDEMIA (ICD-272.4) Assessment: Improved  Her updated medication list for this problem includes:    Lipitor 20 Mg Tabs (Atorvastatin calcium) ..... Once daily  Labs Reviewed: SGOT: 22 (05/13/2010)   SGPT: 15 (05/13/2010)  Lipid Goals: Chol Goal: 200 (07/04/2009)   HDL Goal: 40 (07/04/2009)   LDL Goal: 100 (07/04/2009)   TG Goal: 150 (07/04/2009)  Prior 10 Yr Risk Heart Disease: N/A (07/04/2009)   HDL:57.60 (05/13/2010), 62.50 (09/25/2009)  LDL:113 (10/13/2008), DEL (10/11/2007)  Chol:283 (05/13/2010), 197 (09/25/2009)  Trig:103.0 (05/13/2010), 76 (10/13/2008)  Problem # 3:  UNS ADVRS EFF UNS RX MEDICINAL&BIOLOGICAL SBSTNC (ICD-995.20) liver functions are normals add coezyme q 10  Complete Medication List: 1)  Lipitor 20 Mg Tabs (Atorvastatin calcium) .... Once daily 2)  Aspirin 325 Mg Tabs (Aspirin) .Marland Kitchen.. 1 once daily 3)  Fish Oil Concentrate 1000 Mg Caps (Omega-3 fatty acids) .... Two by mouth daily 4)  Metoprolol Tartrate 25 Mg Tabs (Metoprolol tartrate) .... 1.5 two times a day 5)  Ergocalciferol 50000 Unit Caps (Ergocalciferol) .... One by mouth weekly for 3 month 6)  Co-enzyme Q-10 30 Mg Caps (Coenzyme q10) .... One by mouth daily  Lipid Assessment/Plan:      Based on NCEP/ATP III, the patient's risk factor category is "history of coronary disease, peripheral vascular disease, cerebrovascular disease, or aortic aneurysm".  The patient's lipid goals are as follows: Total cholesterol goal is 200; LDL cholesterol goal is 100; HDL cholesterol goal is 40; Triglyceride goal is 150.  Her LDL cholesterol goal has not been met.  Secondary causes for  hyperlipidemia have been ruled out.  She has been counseled on adjunctive measures for lowering her cholesterol and has been provided with dietary instructions.    Patient Instructions: 1)  Please schedule a follow-up appointment in 6 months. 2)  BMP prior to visit, ICD-9: 401.90 3)  Hepatic Panel prior to visit, ICD-9:995.20 4)  Lipid Panel prior to visit, ICD-9:272.4 Prescriptions: LIPITOR 20 MG  TABS (ATORVASTATIN CALCIUM) once daily  #30 x 11   Entered by:   Willy Eddy, LPN   Authorized by:   Stacie Glaze MD   Signed by:   Willy Eddy, LPN on 24/40/1027   Method used:   Electronically to        [facility]* (retail)       [address]       [place], [place]  [address]       Ph: [phone]       Fax: [phone]   RxID:   [id number]    Orders Added: 1)  Est. Patient Level IV [01093]

## 2010-11-07 NOTE — Assessment & Plan Note (Signed)
Summary: 3 month fup//ccm   Vital Signs:  Patient profile:   76 year old female Height:      64 inches Weight:      128 pounds BMI:     22.05 Temp:     98.2 degrees F oral Pulse rate:   72 / minute Resp:     14 per minute BP sitting:   130 / 80  (left arm)  Vitals Entered By: Willy Eddy, LPN (May 28, 2010 9:07 AM) CC: roa labs, Hypertension Management, Lipid Management Is Patient Diabetic? No   Primary Care Provider:  Stacie Glaze MD  CC:  roa labs, Hypertension Management, and Lipid Management.  History of Present Illness: has been missing the lipitor on average taking it 3 times a week or less has  had multiple medicatl problems and is "tired of the sick roll"  HTN stable see note  see the graph she was in good control at goals with daily liptor  Hypertension History:      She denies headache, chest pain, palpitations, dyspnea with exertion, orthopnea, PND, peripheral edema, visual symptoms, neurologic problems, syncope, and side effects from treatment.  stable.  Further comments include: she ha been taking the blood presure medicatons.        Positive major cardiovascular risk factors include female age 8 years old or older, hyperlipidemia, and hypertension.  Negative major cardiovascular risk factors include non-tobacco-user status.        Positive history for target organ damage include ASHD (either angina/prior MI/prior CABG).  Further assessment for target organ damage reveals no history of stroke/TIA or peripheral vascular disease.    Lipid Management History:      Positive NCEP/ATP III risk factors include female age 72 years old or older, hypertension, and ASHD (either angina/prior MI/prior CABG).  Negative NCEP/ATP III risk factors include non-tobacco-user status, no prior stroke/TIA, no peripheral vascular disease, and no history of aortic aneurysm.      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.0) 4)  Aortic Valve Replacement, Hx of  (ICD-V43.3) 5)  Chronic Lymphadenitis  (ICD-289.1) 6)  Temporomandibular Joint Disorder  (ICD-524.60) 7)  Hypertension, Essential  (ICD-401.9) 8)  Uns Advrs Eff Uns Rx Medicinal&biological Sbstnc  (ICD-995.20) 9)  Osteopenia  (ICD-733.90) 10)  Osteoarthritis  (ICD-715.90) 11)  Hyperlipidemia  (ICD-272.4)  Current Problems (verified): 1)  Syncope  (ICD-780.2) 2)  Bradycardia  (ICD-427.89) 3)  Neoplasm, Malignant, 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 Once Daily 2)  Aspirin 325 Mg Tabs (Aspirin) .Marland Kitchen.. 1 Once Daily 3)  Fish Oil Concentrate 1000 Mg  Caps (Omega-3 Fatty Acids) .... Two By Mouth Daily 4)  Metoprolol Tartrate 25 Mg Tabs (Metoprolol Tartrate) .... 1.5 Two Times A Day 5)  Ergocalciferol 50000 Unit Caps (Ergocalciferol) .... One By Mouth Weekly For 3 Month  Current Medications (verified): 1)  Lipitor 20 Mg  Tabs (Atorvastatin Calcium) .... Once Daily 2)  Aspirin 325 Mg  Tabs (Aspirin) .Marland Kitchen.. 1 Once Daily 3)  Fish Oil Concentrate 1000 Mg  Caps (Omega-3 Fatty Acids) .... Two By Mouth Daily 4)  Metoprolol Tartrate 25 Mg Tabs (Metoprolol Tartrate) .... 1.5 Two Times A Day 5)  Ergocalciferol 50000 Unit Caps (Ergocalciferol) .... One By Mouth Weekly For 3 Month  Allergies (verified): 1)  ! * Evista  Past History:  Family History: Last updated: 08/03/2007 Family History of Stroke F 1st degree relative  <60 Family History of Stroke M 1st degree relative <50  Social History: Last updated: 08/03/2007 Retired Married Never Smoked  Risk Factors: Smoking Status: never (05/28/2010)  Past medical, surgical, family and social histories (including risk factors) reviewed, and no changes noted (except as noted below).  Past Medical History: Reviewed history from 08/03/2007 and no changes required. Hyperlipidemia Osteoarthritis Osteopenia  Past Surgical History: Reviewed history from 10/13/2008 and no changes required. Colonoscopy Cholecystectomy salivary gland surgery for cancer on the left parotid  Family History: Reviewed history from 08/03/2007 and no changes required. Family History of Stroke F 1st degree relative <60 Family History of Stroke M 1st degree relative <50  Social History: Reviewed history from 08/03/2007 and no changes required. Retired Married Never Smoked  Physical Exam  General:  Well-developed,well-nourished,in no acute distress; alert,appropriate and cooperative throughout examination Eyes:  pupils equal and pupils round.   Ears:  R ear normal and L ear normal.   Nose:  External nasal examination shows no deformity or inflammation. Nasal mucosa are pink and moist without lesions or exudates. Lungs:  normal respiratory effort and no wheezes.   Heart:  bradycardia and Grade   1/6 systolic ejection murmur.   Abdomen:  Bowel sounds positive,abdomen soft and non-tender without masses, organomegaly or hernias noted. Msk:  No deformity or scoliosis noted of thoracic or lumbar spine.   Pulses:  R and L carotid,radial,femoral,dorsalis pedis and posterior tibial pulses are full and equal bilaterally Extremities:  trace left pedal edema and trace right pedal edema.   Neurologic:  alert & oriented X3 and finger-to-nose normal.     Impression & Recommendations:  Problem # 1:  HYPERLIPIDEMIA (ICD-272.4) non abherance, councilled on need for medications I have  spent greater that 30 min face to face evaluating this patient  Her updated medication list for this problem includes:    Lipitor 20 Mg Tabs (Atorvastatin calcium) ..... Once daily  Labs Reviewed: SGOT: 22 (05/13/2010)   SGPT: 15 (05/13/2010)  Lipid Goals: Chol Goal: 200 (07/04/2009)   HDL Goal: 40 (07/04/2009)   LDL Goal: 100 (07/04/2009)   TG Goal: 150 (07/04/2009)  Prior 10 Yr Risk Heart Disease: N/A (07/04/2009)   HDL:57.60 (05/13/2010), 62.50 (09/25/2009)  LDL:113 (10/13/2008), DEL (10/11/2007)  Chol:283 (05/13/2010), 197 (09/25/2009)  Trig:103.0 (05/13/2010), 76 (10/13/2008)  Problem # 2:  HYPERTENSION, ESSENTIAL (ICD-401.9)  Her updated medication list for this problem includes:    Metoprolol Tartrate 25 Mg Tabs (Metoprolol tartrate) .Marland Kitchen... 1.5 two times a day  BP today: 130/80 Prior BP: 110/70 (02/26/2010)  Prior 10 Yr Risk Heart Disease: N/A (07/04/2009)  Labs Reviewed: K+: 4.1 (05/13/2010) Creat: : 0.8 (05/13/2010)   Chol: 283 (05/13/2010)   HDL: 57.60 (05/13/2010)   LDL: 113 (10/13/2008)   TG: 103.0 (05/13/2010)  Problem # 3:  BRADYCARDIA (ICD-427.89) Assessment: Unchanged saw the cardiologist ands reveiwed the medications Her updated medication list for this problem includes:    Aspirin 325 Mg Tabs (Aspirin) .Marland Kitchen... 1 once daily    Metoprolol Tartrate 25 Mg Tabs (Metoprolol tartrate) .Marland KitchenMarland KitchenMarland KitchenMarland Kitchen  1.5 two times a day  Problem # 4:  SYNCOPE (ICD-780.2) Assessment: Unchanged no events noted except with rapid movements  Complete Medication List: 1)  Lipitor 20 Mg Tabs (Atorvastatin calcium) .... Once daily 2)  Aspirin 325 Mg Tabs (Aspirin) .Marland Kitchen.. 1 once daily 3)  Fish Oil Concentrate 1000 Mg Caps (Omega-3 fatty acids) .... Two by mouth daily 4)  Metoprolol Tartrate 25 Mg Tabs (Metoprolol tartrate) .... 1.5 two times a day 5)  Ergocalciferol 50000 Unit Caps (Ergocalciferol) .... One by mouth weekly for 3 month  Hypertension Assessment/Plan:      The patient's hypertensive  risk group is category C: Target organ damage and/or diabetes.  Today's blood pressure is 130/80.  Her blood pressure goal is < 140/90.  Lipid Assessment/Plan:      Based on NCEP/ATP III, the patient's risk factor category is "history of coronary disease, peripheral vascular disease, cerebrovascular disease, or aortic aneurysm".  The patient's lipid goals are as follows: Total cholesterol goal is 200; LDL cholesterol goal is 100; HDL cholesterol goal is 40; Triglyceride goal is 150.  Her LDL cholesterol goal has not been met.  Secondary causes for hyperlipidemia have been ruled out.  She has been counseled on adjunctive measures for lowering her cholesterol and has been provided with dietary instructions.    Patient Instructions: 1)  Please schedule a follow-up appointment in 3 months. 2)  Hepatic Panel prior to visit, ICD-9:995.20 3)  Lipid Panel prior to visit, ICD-9:272.4

## 2010-11-07 NOTE — Letter (Signed)
Summary: [facility]  [facility]   Imported By: Maryln Gottron 01/21/2010 10:55:15  _____________________________________________________________________  External Attachment:    Type:   Image     Comment:   External Document

## 2010-11-07 NOTE — Letter (Signed)
Summary: [facility]  [facility]   Imported By: Maryln Gottron 04/15/2010 10:56:24  _____________________________________________________________________  External Attachment:    Type:   Image     Comment:   External Document

## 2010-11-07 NOTE — Letter (Signed)
Summary: [facility]  [facility]   Imported By: Maryln Gottron 01/23/2010 14:25:46  _____________________________________________________________________  External Attachment:    Type:   Image     Comment:   External Document

## 2010-11-07 NOTE — Assessment & Plan Note (Signed)
Summary: consult re: fainting spells/cjr   Vital Signs:  Patient profile:   76 year old female Height:      64 inches Weight:      127 pounds BMI:     21.88 Temp:     98.2 degrees F oral Pulse rate:   72 / minute Resp:     14 per minute BP sitting:   120 / 70  (left arm)  Vitals Entered By: Willy Eddy, LPN (January 07, 1609 1:53 PM) CC: fainted about 1 week ago - to  er, Hypertension Management   CC:  fainted about 1 week ago - to  er and Hypertension Management.  History of Present Illness: AVR 04/2009 AA anurysm repair ( cath) See in the ER for passing  out and had an EKG with  possible bifasicular blook after an episode of syncope no futher events she remains on metoprolol EKG show HR in 40"s with IVCD   ?bivasicular if pulse remains low off the BB might consider pacing  Hypertension History:      She denies headache, chest pain, palpitations, dyspnea with exertion, orthopnea, PND, peripheral edema, visual symptoms, neurologic problems, syncope, and side effects from treatment.        Positive major cardiovascular risk factors include female age 68 years old or older, hyperlipidemia, and hypertension.  Negative major cardiovascular risk factors include non-tobacco-user status.        Positive history for target organ damage include ASHD (either angina/prior MI/prior CABG).  Further assessment for target organ damage reveals no history of stroke/TIA or peripheral vascular disease.     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 Once Daily 2)  Aspirin 325 Mg Tabs (Aspirin) .Marland Kitchen.. 1 Once Daily 3)  Fish Oil Concentrate 1000 Mg  Caps (Omega-3 Fatty Acids) .... Two By Mouth Daily 4)  Multaq 400 Mg Tabs (Dronedarone Hcl) .Marland Kitchen.. 1 Two Times A Day 5)  Metoprolol Tartrate 25 Mg Tabs (Metoprolol Tartrate) .... 1.5 Two Times A Day 6)  Ergocalciferol 50000 Unit Caps (Ergocalciferol) .... One By Mouth Weekly For 3 Month 7)  Ergocalciferol 50000 Unit Caps (Ergocalciferol) .... One By Mouth Weekly  Current Medications (verified): 1)  Lipitor 20 Mg  Tabs (Atorvastatin Calcium) .... Once Daily 2)  Aspirin 325 Mg Tabs (Aspirin) .Marland Kitchen.. 1 Once Daily 3)  Fish Oil Concentrate 1000 Mg  Caps (Omega-3 Fatty Acids) .... Two By Mouth Daily 4)  Multaq 400 Mg Tabs (Dronedarone Hcl) .Marland Kitchen.. 1 Two Times A Day 5)  Metoprolol Tartrate 25 Mg Tabs (Metoprolol Tartrate) .Marland KitchenMarland KitchenMarland Kitchen  1.5 Two Times A Day 6)  Ergocalciferol 50000 Unit Caps (Ergocalciferol) .... One By Mouth Weekly For 3 Month  Allergies (verified): 1)  ! * Evista  Past History:  Family History: Last updated: 08/03/2007 Family History of Stroke F 1st degree relative <60 Family History of Stroke M 1st degree relative <50  Social History: Last updated: 08/03/2007 Retired Married Never Smoked  Risk Factors: Smoking Status: never (01/07/2010)  Past medical, surgical, family and social histories  (including risk factors) reviewed, and no changes noted (except as noted below).  Past Medical History: Reviewed history from 08/03/2007 and no changes required. Hyperlipidemia Osteoarthritis Osteopenia  Past Surgical History: Reviewed history from 10/13/2008 and no changes required. Colonoscopy Cholecystectomy salivary gland surgery for cancer on the left parotid  Family History: Reviewed history from 08/03/2007 and no changes required. Family History of Stroke F 1st degree relative <60 Family History of Stroke M 1st degree relative <50  Social History: Reviewed history from 08/03/2007 and no changes required. Retired Married Never Smoked  Review of Systems  The patient denies anorexia, fever, weight loss, weight gain, vision loss, decreased hearing, hoarseness, chest pain, syncope, dyspnea on exertion, peripheral edema, prolonged cough, headaches, hemoptysis, abdominal pain, melena, hematochezia, severe indigestion/heartburn, hematuria, incontinence, genital sores, muscle weakness, suspicious skin lesions, transient blindness, difficulty walking, depression, unusual weight change, abnormal bleeding, enlarged lymph nodes, angioedema, and breast masses.    Physical Exam  General:  Well-developed,well-nourished,in no acute distress; alert,appropriate and cooperative throughout examination Head:  post surgical changes Ears:  R ear normal and L ear normal.   Mouth:  scar on the left  extending from the ear to the neck Lungs:  normal respiratory effort and no wheezes.   Heart:  bradycardia and Grade   1/6 systolic ejection murmur.   Abdomen:  Bowel sounds positive,abdomen soft and non-tender without masses, organomegaly or hernias noted.   Impression & Recommendations:  Problem # 1:  BRADYCARDIA (ICD-427.89) Assessment Deteriorated  I have spent greater that 45 min face to face evaluating this patient and over 1/2 of this time was in councilling calling Dr Marcell Barlow FROM  CARDIOLOGY AND DEVELOPING PLAN TO WEAN OFF THE bb AND RETURN TO bAPTOISYT FOR fu  Her updated medication list for this problem includes:    Aspirin 325 Mg Tabs (Aspirin) .Marland Kitchen... 1 once daily    Multaq 400 Mg Tabs (Dronedarone hcl) .Marland Kitchen... 1 two times a day    Metoprolol Tartrate 25 Mg Tabs (Metoprolol tartrate) .Marland Kitchen... 1.5 two times a day  BP today: 120/70 Prior BP: 116/74 (11/27/2009)  Prior 10 Yr Risk Heart Disease: N/A (07/04/2009)  Labs Reviewed: K+: 4.5 (10/13/2008) Creat: : 0.9 (10/13/2008)   Chol: 197 (09/25/2009)   HDL: 62.50 (09/25/2009)   LDL: 113 (10/13/2008)   TG: 76 (10/13/2008)  Problem # 2:  AORTIC STENOSIS, CALCIFIC (ICD-424.1) Assessment: Deteriorated POST VALVE REPLACEMENT Her updated medication list for this problem includes:    Aspirin 325 Mg Tabs (Aspirin) .Marland Kitchen... 1 once daily    Multaq 400 Mg Tabs (Dronedarone hcl) .Marland Kitchen... 1 two times a day    Metoprolol Tartrate 25 Mg Tabs (Metoprolol tartrate) .Marland Kitchen... 1.5 two times a day  Problem # 3:  HYPERTENSION, ESSENTIAL (ICD-401.9)  Her updated medication list for this problem includes:    Metoprolol Tartrate 25 Mg Tabs (Metoprolol tartrate) .Marland Kitchen... 1.5 two times a day  BP today: 120/70 Prior BP: 116/74 (11/27/2009)  Prior 10 Yr Risk Heart Disease: N/A (07/04/2009)  Labs Reviewed: K+: 4.5 (10/13/2008) Creat: :  0.9 (10/13/2008)   Chol: 197 (09/25/2009)   HDL: 62.50 (09/25/2009)   LDL: 113 (10/13/2008)   TG: 76 (10/13/2008)  Complete Medication List: 1)  Lipitor 20 Mg Tabs (Atorvastatin calcium) .... Once daily 2)  Aspirin 325 Mg Tabs (Aspirin) .Marland Kitchen.. 1 once daily 3)  Fish Oil Concentrate 1000 Mg Caps (Omega-3 fatty acids) .... Two by mouth daily 4)  Multaq 400 Mg Tabs (Dronedarone hcl) .Marland Kitchen.. 1 two times a day 5)  Metoprolol Tartrate 25 Mg Tabs (Metoprolol tartrate) .... 1.5 two times a day 6)  Ergocalciferol 50000 Unit Caps (Ergocalciferol) .... One by mouth weekly for 3 month  Hypertension Assessment/Plan:      The  patient's hypertensive risk group is category C: Target organ damage and/or diabetes.  Today's blood pressure is 120/70.  Her blood pressure goal is < 140/90.  Patient Instructions: 1)  take one metoprolol  for 3 day then 1/2 for 3 days then stop 2)  Take a copy of the Ekg to the  cardiologist at [facility] 3)  if you have palpitations or  blood pressure goes up start back on 1/2 of toprol 4)  if you get suddenly SOB call us and go to ER

## 2010-11-07 NOTE — Letter (Signed)
Summary: [facility]-Otolaryngology  [facility]   Imported By: Maryln Gottron 03/13/2010 09:52:45  _____________________________________________________________________  External Attachment:    Type:   Image     Comment:   External Document

## 2010-11-08 ENCOUNTER — Other Ambulatory Visit: Payer: Self-pay | Admitting: *Deleted

## 2010-11-08 ENCOUNTER — Other Ambulatory Visit: Payer: Self-pay | Admitting: Internal Medicine

## 2010-11-08 DIAGNOSIS — I10 Essential (primary) hypertension: Secondary | ICD-10-CM

## 2010-11-08 MED ORDER — METOPROLOL TARTRATE 25 MG PO TABS: 25.0000 mg | ORAL_TABLET | Freq: Two times a day (BID) | ORAL | Status: DC

## 2010-12-30 LAB — APTT: aPTT: 28 seconds (ref 24–37)

## 2010-12-30 LAB — DIFFERENTIAL
Eosinophils Absolute: 0.2 10*3/uL (ref 0.0–0.7)
Eosinophils Relative: 4 % (ref 0–5)
Lymphocytes Relative: 15 % (ref 12–46)
Monocytes Relative: 8 % (ref 3–12)
Neutro Abs: 3.8 10*3/uL (ref 1.7–7.7)
Neutrophils Relative %: 73 % (ref 43–77)

## 2010-12-30 LAB — CBC
HCT: 35.4 % — ABNORMAL LOW (ref 36.0–46.0)
MCHC: 34.5 g/dL (ref 30.0–36.0)
Platelets: 167 10*3/uL (ref 150–400)
RDW: 13.8 % (ref 11.5–15.5)

## 2010-12-30 LAB — URINALYSIS, ROUTINE W REFLEX MICROSCOPIC
Nitrite: NEGATIVE
Specific Gravity, Urine: 1.013 (ref 1.005–1.030)
Urobilinogen, UA: 1 mg/dL (ref 0.0–1.0)

## 2010-12-30 LAB — POCT I-STAT, CHEM 8
Calcium, Ion: 1.13 mmol/L (ref 1.12–1.32)
Chloride: 108 mEq/L (ref 96–112)
HCT: 38 % (ref 36.0–46.0)
Hemoglobin: 12.9 g/dL (ref 12.0–15.0)
TCO2: 24 mmol/L (ref 0–100)

## 2010-12-30 LAB — POCT CARDIAC MARKERS
Myoglobin, poc: 41.8 ng/mL (ref 12–200)
Troponin i, poc: <0.05 ng/mL (ref 0.00–0.09)

## 2010-12-30 LAB — URINE CULTURE

## 2011-01-23 ENCOUNTER — Other Ambulatory Visit: Payer: Self-pay | Admitting: Internal Medicine

## 2011-01-23 DIAGNOSIS — Z1231 Encounter for screening mammogram for malignant neoplasm of breast: Secondary | ICD-10-CM

## 2011-02-18 ENCOUNTER — Other Ambulatory Visit (INDEPENDENT_AMBULATORY_CARE_PROVIDER_SITE_OTHER): Payer: Medicare Other | Admitting: Internal Medicine

## 2011-02-18 DIAGNOSIS — E785 Hyperlipidemia, unspecified: Secondary | ICD-10-CM

## 2011-02-18 DIAGNOSIS — T887XXA Unspecified adverse effect of drug or medicament, initial encounter: Secondary | ICD-10-CM

## 2011-02-18 DIAGNOSIS — I10 Essential (primary) hypertension: Secondary | ICD-10-CM

## 2011-02-18 LAB — LIPID PANEL
Cholesterol: 184 mg/dL (ref 0–200)
HDL: 69.3 mg/dL (ref >39.00)
LDL Cholesterol: 104 mg/dL — ABNORMAL HIGH (ref 0–99)
Total CHOL/HDL Ratio: 3
Triglycerides: 52 mg/dL (ref 0.0–149.0)
VLDL: 10.4 mg/dL (ref 0.0–40.0)

## 2011-02-18 LAB — BASIC METABOLIC PANEL
BUN: 15 mg/dL (ref 6–23)
CO2: 29 mEq/L (ref 19–32)
Calcium: 9.2 mg/dL (ref 8.4–10.5)
Chloride: 106 mEq/L (ref 96–112)
Creatinine, Ser: 0.8 mg/dL (ref 0.4–1.2)
GFR: 76.83 mL/min (ref >60.00)
Glucose, Bld: 79 mg/dL (ref 70–99)
Potassium: 4.7 mEq/L (ref 3.5–5.1)
Sodium: 141 mEq/L (ref 135–145)

## 2011-02-18 LAB — HEPATIC FUNCTION PANEL
Albumin: 4 g/dL (ref 3.5–5.2)
Total Protein: 6.7 g/dL (ref 6.0–8.3)

## 2011-02-20 ENCOUNTER — Ambulatory Visit
Admission: RE | Admit: 2011-02-20 | Discharge: 2011-02-20 | Disposition: A | Discharge status: Home / Self Care | Payer: Medicare Other | Source: Ambulatory Visit | Attending: Internal Medicine | Admitting: Internal Medicine

## 2011-02-20 ENCOUNTER — Encounter: Payer: Self-pay | Admitting: Internal Medicine

## 2011-02-20 DIAGNOSIS — Z1231 Encounter for screening mammogram for malignant neoplasm of breast: Secondary | ICD-10-CM

## 2011-02-21 NOTE — Op Note (Signed)
   NAME:  Whitney Manning, Whitney Manning                         ACCOUNT NO.:  [id number]   MEDICAL RECORD NO.:  [id number]                   PATIENT TYPE:  AMB   LOCATION:  [facility]                                 FACILITY:  [facility]   PHYSICIAN:  Sigmund I. Patsi Sears, M.D.         DATE OF BIRTH:  [birth year]   DATE OF PROCEDURE:  10/20/2002  DATE OF DISCHARGE:                                 OPERATIVE REPORT   PREOPERATIVE DIAGNOSIS:  Left lower ureteral calculus.   POSTOPERATIVE DIAGNOSIS:  Left lower ureteral calculus.   OPERATION:  Cystoureteroscopy, left retrograde pyelogram with  interpretation, left ureteroscopy and pyeloscopy, left double-J catheter  insertion.   SURGEON:  Sigmund I. Patsi Sears, M.D.   ANESTHESIA:  General (LMA).   DESCRIPTION OF PROCEDURE:  After appropriate preanesthesia the patient was  brought  to the operating room and placed on the operating table in the  dorsal supine position, where general LMA anesthesia was introduced. She was  then replaced in the dorsal lithotomy position and the pubis was prepped  with Betadine solution and draped in the usual fashion.   The patient underwent a cystourethroscopy, left retrograde pyelogram with  interpretation, left ureteroscopy, pyeloscopy with identification of the  ureter, but with no identification of the stone. Because of the patient's  symptoms, it was elected to place a double-J catheter, and a 6 French x 24  cm catheter was passed without difficulty, curled in the kidney and in the  bladder.   The patient tolerated the procedure well. She was awakened and allowed to be  discharged in good condition. She will be discharged on Percocet, Macrobid  and _______________ DS. She will return to the office for followup removal  of the double-J catheter.                                               Sigmund I. Patsi Sears, M.D.    SIT/MEDQ  D:  10/20/2002  T:  10/20/2002  Job:  [id number]

## 2011-02-25 ENCOUNTER — Ambulatory Visit (INDEPENDENT_AMBULATORY_CARE_PROVIDER_SITE_OTHER): Payer: Medicare Other | Admitting: Internal Medicine

## 2011-02-25 ENCOUNTER — Encounter: Payer: Self-pay | Admitting: Internal Medicine

## 2011-02-25 VITALS — BP 136/80 | HR 60 | Temp 98.2°F | Resp 14 | Ht 64.0 in | Wt 134.0 lb

## 2011-02-25 DIAGNOSIS — E785 Hyperlipidemia, unspecified: Secondary | ICD-10-CM

## 2011-02-25 DIAGNOSIS — I10 Essential (primary) hypertension: Secondary | ICD-10-CM

## 2011-02-25 DIAGNOSIS — I498 Other specified cardiac arrhythmias: Secondary | ICD-10-CM

## 2011-02-25 MED ORDER — METOPROLOL TARTRATE 25 MG PO TABS: 25.0000 mg | ORAL_TABLET | ORAL | Status: DC

## 2011-02-25 MED ORDER — VITAMIN D 50 MCG (2000 UT) PO CAPS: 1.0000 | ORAL_CAPSULE | Freq: Every day | ORAL | Status: AC

## 2011-02-25 NOTE — Progress Notes (Signed)
  Subjective:    Patient ID: Whitney Manning, female    DOB: 04/01/1935, 76 y.o.   MRN: [id number]  HPI  Patient is a 76 year old white female who presents for followup bradycardia and hypertension.  As well as lipids which a profile was drawn prior to this visit.  She has a history of aortic valve replacement with pig valve after she had surgery for a malignant parotid tumor was found to have severe aortic stenosis.  Initially after her surgery she had an arrhythmia which resulted in the use of an antiarrhythmic drug but that has discontinued the drug after Holter monitor showed her to be in sinus rhythm.  She was placed on Lopressor and was recently her Lopressor dose was adjusted to 2 symptomatic bradycardia  Review of Systems  Constitutional: Positive for activity change. Negative for appetite change and fatigue.  HENT: Positive for neck stiffness. Negative for ear pain, congestion, neck pain, postnasal drip and sinus pressure.   Eyes: Negative for redness and visual disturbance.  Respiratory: Negative for cough, shortness of breath and wheezing.   Gastrointestinal: Negative for abdominal pain and abdominal distention.  Genitourinary: Negative for dysuria, frequency and menstrual problem.  Musculoskeletal: Negative for myalgias, joint swelling and arthralgias.  Skin: Negative for rash and wound.  Neurological: Negative for dizziness, weakness and headaches.  Hematological: Negative for adenopathy. Does not bruise/bleed easily.  Psychiatric/Behavioral: Negative for sleep disturbance and decreased concentration.       Past Medical History  Diagnosis Date  . Hyperlipidemia   . Arthritis   . Osteopenia    Past Surgical History  Procedure Date  . Colonoscopy   . Cholecystectomy   . 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 Cardiac valve replacement     aortic valve ( pig)    reports that she has never smoked. She does not have any smokeless tobacco history on  file. She reports that she does not drink alcohol or use illicit drugs. family history includes Stroke in her mother. No Known Allergies  Objective:   Physical Exam    Blood pressure 136/80, pulse 60, temperature 98.2 F (36.8 C), resp. rate 14, height 5\' 4"  (1.626 m), weight 134 lb (60.782 kg). On physical examination she is a pleasant elderly white female in no apparent distress T. within normal limits neck was supple there is a scar extending from the mandible to the clavicle on her left side. Examination revealed regular rate and rhythm abdomen was soft and nontender lung posterior clear to auscultation percussion extremity examination revealed no edema she is oriented to person place and time appropriate to judgment and cognition    Assessment & Plan:  History of aortic valve placement on no anticoagulation doing well.  History of arrhythmia post aortic valve replacement was on multaq now on a beta blocker alone.  We adjusted her beta blocker dose to treat symptomatic bradycardia she presents today with a pulse of 60 and feeling much better.  We also reviewed her cholesterol with her she is a Total cholesterol HDL cholesterol and LDL cholesterol she is at goal for her age and condition

## 2011-03-31 ENCOUNTER — Encounter: Payer: Self-pay | Admitting: Internal Medicine

## 2011-03-31 ENCOUNTER — Ambulatory Visit (INDEPENDENT_AMBULATORY_CARE_PROVIDER_SITE_OTHER): Payer: Medicare Other | Admitting: Internal Medicine

## 2011-03-31 VITALS — BP 120/80 | Temp 98.1°F | Wt 134.0 lb

## 2011-03-31 DIAGNOSIS — M1990 Unspecified osteoarthritis, unspecified site: Secondary | ICD-10-CM

## 2011-03-31 NOTE — Patient Instructions (Signed)
Take Aleve twice daily  Call or return to clinic prn if these symptoms worsen or fail to improve as anticipated.

## 2011-03-31 NOTE — Progress Notes (Signed)
  Subjective:    Patient ID: Whitney Manning, female    DOB: 10/26/1934, 76 y.o.   MRN: [id number]  HPI  76 year old patient who presents with a chief complaint of an insect bite involving her left leg.  She states that she sustained a insect bite to the medial aspect of the left ankle 2 weeks ago she had some initial erythema that has resolved. At the present time she describes a sense of dysesthesia involving the sole of the left foot. She feels this has altered her gait to the point where she now has some left hip discomfort.    Review of Systems  Musculoskeletal: Negative for arthralgias.  Skin: Positive for rash.       Objective:   Physical Exam  Constitutional: She appears well-developed and well-nourished.  Musculoskeletal:       The patient had no motor weakness;  she walked with a normal gait  Neurological:       Left facial weakness status post resection of a left parotid tumor  Skin: No rash noted.       The left leg appears normal without evidence of a dermatitis          Assessment & Plan:  Left leg discomfort. Patient does have a history of osteoarthritis. We'll suggest Aleve twice daily and chronically observed. She will call if symptoms do not resolve

## 2011-09-15 ENCOUNTER — Ambulatory Visit (INDEPENDENT_AMBULATORY_CARE_PROVIDER_SITE_OTHER): Payer: Medicare Other | Admitting: Internal Medicine

## 2011-09-15 ENCOUNTER — Encounter: Payer: Self-pay | Admitting: Internal Medicine

## 2011-09-15 DIAGNOSIS — T887XXA Unspecified adverse effect of drug or medicament, initial encounter: Secondary | ICD-10-CM

## 2011-09-15 DIAGNOSIS — Z0000 Encounter for general adult medical examination without abnormal findings: Secondary | ICD-10-CM

## 2011-09-15 DIAGNOSIS — E785 Hyperlipidemia, unspecified: Secondary | ICD-10-CM

## 2011-09-15 DIAGNOSIS — I10 Essential (primary) hypertension: Secondary | ICD-10-CM

## 2011-09-15 LAB — CBC WITH DIFFERENTIAL/PLATELET
Basophils Relative: 0.4 % (ref 0.0–3.0)
Eosinophils Relative: 1.5 % (ref 0.0–5.0)
HCT: 40.5 % (ref 36.0–46.0)
Hemoglobin: 13.7 g/dL (ref 12.0–15.0)
Lymphs Abs: 1.1 10*3/uL (ref 0.7–4.0)
MCV: 92.1 fl (ref 78.0–100.0)
Monocytes Absolute: 0.5 10*3/uL (ref 0.1–1.0)
Monocytes Relative: 8.1 % (ref 3.0–12.0)
Neutro Abs: 4.3 10*3/uL (ref 1.4–7.7)
Platelets: 198 10*3/uL (ref 150.0–400.0)
WBC: 6.1 10*3/uL (ref 4.5–10.5)

## 2011-09-15 LAB — HEPATIC FUNCTION PANEL
ALT: 18 U/L (ref 0–35)
AST: 27 U/L (ref 0–37)
Bilirubin, Direct: 0 mg/dL (ref 0.0–0.3)
Total Bilirubin: 0.8 mg/dL (ref 0.3–1.2)
Total Protein: 7.6 g/dL (ref 6.0–8.3)

## 2011-09-15 LAB — BASIC METABOLIC PANEL
BUN: 19 mg/dL (ref 6–23)
CO2: 26 mEq/L (ref 19–32)
Chloride: 105 mEq/L (ref 96–112)
Creatinine, Ser: 1 mg/dL (ref 0.4–1.2)
Potassium: 4 mEq/L (ref 3.5–5.1)

## 2011-09-15 LAB — POCT URINALYSIS DIPSTICK
Blood, UA: NEGATIVE
Glucose, UA: NEGATIVE
Nitrite, UA: NEGATIVE
Spec Grav, UA: 1.025
pH, UA: 5.5

## 2011-09-15 LAB — LIPID PANEL
Total CHOL/HDL Ratio: 3
Triglycerides: 65 mg/dL (ref 0.0–149.0)

## 2011-09-15 NOTE — Progress Notes (Signed)
Subjective:    Patient ID: Whitney Manning, female    DOB: 08/12/1935, 76 y.o.   MRN: [id number]  HPI  Patient is treated for hyperlipidemia l and D. levels with osteoporosis and hypertension on Lopressor.  She has had both ovaries removed but still has a uterus     Review of Systems  Constitutional: Negative for activity change, appetite change and fatigue.  HENT: Negative for ear pain, congestion, neck pain, postnasal drip and sinus pressure.   Eyes: Negative for redness and visual disturbance.  Respiratory: Negative for cough, shortness of breath and wheezing.   Gastrointestinal: Negative for abdominal pain and abdominal distention.  Genitourinary: Negative for dysuria, frequency and menstrual problem.  Musculoskeletal: Negative for myalgias, joint swelling and arthralgias.  Skin: Negative for rash and wound.  Neurological: Negative for dizziness, weakness and headaches.  Hematological: Negative for adenopathy. Does not bruise/bleed easily.  Psychiatric/Behavioral: Negative for sleep disturbance and decreased concentration.       Objective:   Physical Exam  Nursing note and vitals reviewed. Constitutional: She is oriented to person, place, and time. She appears well-developed and well-nourished. No distress.  HENT:  Head: Normocephalic and atraumatic.  Right Ear: External ear normal.  Left Ear: External ear normal.  Nose: Nose normal.  Mouth/Throat: Oropharynx is clear and moist.       Post surgical changes to the neck  Eyes: Conjunctivae and EOM are normal. Pupils are equal, round, and reactive to light.  Neck: Normal range of motion. Neck supple. No JVD present. No tracheal deviation present. No thyromegaly present.  Cardiovascular: Normal rate, regular rhythm, normal heart sounds and intact distal pulses.   No murmur heard. Pulmonary/Chest: Effort normal and breath sounds normal. She has no wheezes. She exhibits no tenderness.  Abdominal: Soft. Bowel sounds are  normal.  Musculoskeletal: Normal range of motion. She exhibits no edema and no tenderness.  Lymphadenopathy:    She has no cervical adenopathy.  Neurological: She is alert and oriented to person, place, and time. She has normal reflexes. No cranial nerve deficit.  Skin: Skin is warm and dry. She is not diaphoretic.  Psychiatric: She has a normal mood and affect. Her behavior is normal.          Assessment & Plan:  Hypertension stable on beta blocker.  Cholesterol treated with Lipitor 20 mg by mouth daily Intralipid will be monitored today as well as liver to assess any potential side effects of medication her vitamin D level will be drawn today to assess an D. Supplementation due to  low vitamin D levels and osteoporosis.  Pap smear deferred until next year   Subjective:    Whitney Manning is a 76 y.o. female who presents for Medicare Annual/Subsequent preventive examination.  Preventive Screening-Counseling & Management  Tobacco History  Smoking status  . Never Smoker   Smokeless tobacco  . Not on file     Problems Prior to Visit 1.   Current Problems (verified) Patient Active Problem List  Diagnoses  . NEOPLASM, MALIGNANT, PAROTID  . HYPERLIPIDEMIA  . CHRONIC LYMPHADENITIS  . HYPERTENSION, ESSENTIAL  . BRADYCARDIA  . TEMPOROMANDIBULAR JOINT DISORDER  . OSTEOARTHRITIS  . OSTEOPENIA  . SYNCOPE  . UNS ADVRS EFF UNS RX MEDICINAL&BIOLOGICAL SBSTNC  . AORTIC VALVE REPLACEMENT, HX OF    Medications Prior to Visit Current Outpatient Prescriptions on File Prior to Visit  Medication Sig Dispense Refill  . aspirin 325 MG tablet Take 325 mg by mouth daily.        Marland Kitchen  atorvastatin (LIPITOR) 20 MG tablet Take 20 mg by mouth daily.        . Cholecalciferol (VITAMIN D) 2000 UNITS CAPS Take 1 capsule (2,000 Units total) by mouth daily.  30 capsule    . co-enzyme Q-10 30 MG capsule Take 30 mg by mouth daily.        . fish oil-omega-3 fatty acids 1000 MG capsule Take 2 g by  mouth 2 (two) times daily.        . metoprolol tartrate (LOPRESSOR) 25 MG tablet 1.5 in AM and 1 in pm         Current Medications (verified) Current Outpatient Prescriptions  Medication Sig Dispense Refill  . aspirin 325 MG tablet Take 325 mg by mouth daily.        Marland Kitchen atorvastatin (LIPITOR) 20 MG tablet Take 20 mg by mouth daily.        . Cholecalciferol (VITAMIN D) 2000 UNITS CAPS Take 1 capsule (2,000 Units total) by mouth daily.  30 capsule    . co-enzyme Q-10 30 MG capsule Take 30 mg by mouth daily.        . fish oil-omega-3 fatty acids 1000 MG capsule Take 2 g by mouth 2 (two) times daily.        . metoprolol tartrate (LOPRESSOR) 25 MG tablet 1.5 in AM and 1 in pm          Allergies (verified) Review of patient's allergies indicates no known allergies.   PAST HISTORY  Family History Family History  Problem Relation Age of Onset  . Stroke Mother     Social History History  Substance Use Topics  . Smoking status: Never Smoker   . Smokeless tobacco: Not on file  . Alcohol Use: No     Are there smokers in your home (other than you)? No  Risk Factors Current exercise habits: The patient does not participate in regular exercise at present.  Dietary issues discussed: reviewed exercize   Cardiac risk factors: advanced age (older than 10 for men, 47 for women) and hypertension.  Depression Screen (Note: if answer to either of the following is "Yes", a more complete depression screening is indicated)   Over the past two weeks, have you felt down, depressed or hopeless? No  Over the past two weeks, have you felt little interest or pleasure in doing things? No  Have you lost interest or pleasure in daily life? No  Do you often feel hopeless? No  Do you cry easily over simple problems? No  Activities of Daily Living In your present state of health, do you have any difficulty performing the following activities?:  Driving? No Managing money?  No Feeding yourself?  No Getting from bed to chair? No Climbing a flight of stairs? No Preparing food and eating?: No Bathing or showering? No Getting dressed: No Getting to the toilet? No Using the toilet:No Moving around from place to place: No In the past year have you fallen or had a near fall?:No   Are you sexually active?  No  Do you have more than one partner?  No  Hearing Difficulties: No Do you often ask people to speak up or repeat themselves? No Do you experience ringing or noises in your ears? No Do you have difficulty understanding soft or whispered voices? No   Do you feel that you have a problem with memory? No  Do you often misplace items? No  Do you feel safe at home?  No  Cognitive Testing  Alert? Yes  Normal Appearance?Yes  Oriented to person? Yes  Place? Yes   Time? Yes  Recall of three objects?  Yes  Can perform simple calculations? Yes  Displays appropriate judgment?Yes  Can read the correct time from a watch face?Yes   Advanced Directives have been discussed with the patient? Yes  List the Names of Other Physician/Practitioners you currently use: 1.    Indicate any recent Medical Services you may have received from other than [facility] providers in the past year (date may be approximate).  Immunization History  Administered Date(s) Administered  . Influenza Split 07/16/2011  . Influenza Whole 10/06/2001, 07/04/2009  . Pneumococcal Polysaccharide 07/04/2009  . Td 10/06/2001  . Zoster 03/03/2007    Screening Tests Health Maintenance  Topic Date Due  . Influenza Vaccine  07/07/2011  . Tetanus/tdap  10/07/2011  . Colonoscopy  10/17/2015  . Pneumococcal Polysaccharide Vaccine Age 21 And Over  Completed  . Zostavax  Completed    All answers were reviewed with the patient and necessary referrals were made:  Carrie Mew   09/15/2011   History reviewed: allergies, current medications, past family history, past medical history, past social history, past surgical  history and problem list  Review of Systems A comprehensive review of systems was negative.    Objective:  Pt seeing opthalmology    Vision by Snellen chart: right eye:20/20, left eye:20/20  Body mass index is 22.83 kg/(m^2). BP 130/78  Pulse 72  Temp 98.3 F (36.8 C)  Resp 16  Ht 5\' 4"  (1.626 m)  Wt 133 lb (60.328 kg)  BMI 22.83 kg/m2 See prior note for exAM}     Assessment:     This is a routine physical examination for this healthy  Female. Reviewed all health maintenance protocols including mammography colonoscopy bone density and reviewed appropriate screening labs. Her immunization history was reviewed as well as her current medications and allergies refills of her chronic medications were given and the plan for yearly health maintenance was discussed all orders and referrals were made as appropriate.       Plan:     During the course of the visit the patient was educated and counseled about appropriate screening and preventive services including:    Influenza vaccine  Screening mammography  Glaucoma screening  Diet review for nutrition referral? Yes ____  Not Indicated ___X   Patient Instructions (the written plan) was given to the patient.  Medicare Attestation I have personally reviewed: The patient's medical and social history Their use of alcohol, tobacco or illicit drugs Their current medications and supplements The patient's functional ability including ADLs,fall risks, home safety risks, cognitive, and hearing and visual impairment Diet and physical activities Evidence for depression or mood disorders  The patient's weight, height, BMI, and visual acuity have been recorded in the chart.  I have made referrals, counseling, and provided education to the patient based on review of the above and I have provided the patient with a written personalized care plan for preventive services.     Darryll Capers EDWARD   09/15/2011

## 2011-09-15 NOTE — Patient Instructions (Signed)
The patient is instructed to continue all medications as prescribed. Schedule followup with check out clerk upon leaving the clinic  

## 2011-09-18 DIAGNOSIS — C089 Malignant neoplasm of major salivary gland, unspecified: Secondary | ICD-10-CM | POA: Insufficient documentation

## 2011-10-23 ENCOUNTER — Other Ambulatory Visit: Payer: Self-pay | Admitting: Dermatology

## 2011-10-23 DIAGNOSIS — L570 Actinic keratosis: Secondary | ICD-10-CM | POA: Diagnosis not present

## 2011-10-23 DIAGNOSIS — H61009 Unspecified perichondritis of external ear, unspecified ear: Secondary | ICD-10-CM | POA: Diagnosis not present

## 2011-10-23 DIAGNOSIS — L820 Inflamed seborrheic keratosis: Secondary | ICD-10-CM | POA: Diagnosis not present

## 2011-10-29 ENCOUNTER — Other Ambulatory Visit: Payer: Self-pay | Admitting: *Deleted

## 2011-10-29 MED ORDER — ATORVASTATIN CALCIUM 20 MG PO TABS: 20.0000 mg | ORAL_TABLET | Freq: Every day | ORAL | Status: DC

## 2011-12-04 ENCOUNTER — Encounter: Payer: Self-pay | Admitting: Internal Medicine

## 2011-12-04 ENCOUNTER — Ambulatory Visit (INDEPENDENT_AMBULATORY_CARE_PROVIDER_SITE_OTHER): Payer: Medicare Other | Admitting: Internal Medicine

## 2011-12-04 DIAGNOSIS — I10 Essential (primary) hypertension: Secondary | ICD-10-CM

## 2011-12-04 DIAGNOSIS — M25552 Pain in left hip: Secondary | ICD-10-CM

## 2011-12-04 DIAGNOSIS — M25559 Pain in unspecified hip: Secondary | ICD-10-CM | POA: Diagnosis not present

## 2011-12-04 DIAGNOSIS — M1990 Unspecified osteoarthritis, unspecified site: Secondary | ICD-10-CM | POA: Diagnosis not present

## 2011-12-04 MED ORDER — MELOXICAM 7.5 MG PO TABS: 7.5000 mg | ORAL_TABLET | Freq: Every day | ORAL | Status: DC

## 2011-12-04 MED ORDER — METHYLPREDNISOLONE ACETATE 80 MG/ML IJ SUSP
80.0000 mg | Freq: Once | INTRAMUSCULAR | Status: AC
  Administered 2011-12-04: 80 mg via INTRAMUSCULAR

## 2011-12-04 NOTE — Patient Instructions (Signed)
Meloxicam- one tablet daily  Take [phone] mg of Tylenol every 6 hours as needed for pain relief or fever.  Avoid taking more than 3000 mg in a 24-hour period (  This may cause liver damage).  You  may move around, but avoid painful motions and activities.  Apply  Heat to the sore area for 15 to 20 minutes 3 or 4 times daily  if having significant discomfort

## 2011-12-04 NOTE — Progress Notes (Signed)
  Subjective:    Patient ID: Whitney Manning, female    DOB: 1935/06/05, 77 y.o.   MRN: [id number]  HPI  77 year old patient who has a history of osteoarthritis who presents with a two-month history of intermittent pain in the left hip. Pain tends be worse towards the end of the day and does not bother her at night. Pain occasionally radiates to the left groin region and she occasionally notes pain in the left lumbar region as well. No real aggravating factors. No constitutional complaints. She has taken no medication for the discomfort.    Review of Systems  Musculoskeletal: Positive for back pain and arthralgias (left hip pain).       Objective:   Physical Exam  Constitutional: She appears well-developed and well-nourished. No distress.  Musculoskeletal: Normal range of motion.       Straight leg testing was normal Range of motion of the left hip was reasonably well intact and did not seem to aggravate any pain          Assessment & Plan:   Left hip pain. May represent early arthritis or possible hip bursitis. Will treat with Depo-Medrol and placed on meloxicam as needed. Will call if unimproved and will consider radiograph

## 2012-01-14 ENCOUNTER — Other Ambulatory Visit: Payer: Self-pay | Admitting: Internal Medicine

## 2012-01-14 DIAGNOSIS — Z1231 Encounter for screening mammogram for malignant neoplasm of breast: Secondary | ICD-10-CM

## 2012-02-11 ENCOUNTER — Other Ambulatory Visit: Payer: Self-pay | Admitting: Dermatology

## 2012-02-11 DIAGNOSIS — L570 Actinic keratosis: Secondary | ICD-10-CM | POA: Diagnosis not present

## 2012-02-11 DIAGNOSIS — D239 Other benign neoplasm of skin, unspecified: Secondary | ICD-10-CM | POA: Diagnosis not present

## 2012-02-11 DIAGNOSIS — L820 Inflamed seborrheic keratosis: Secondary | ICD-10-CM | POA: Diagnosis not present

## 2012-02-11 DIAGNOSIS — L821 Other seborrheic keratosis: Secondary | ICD-10-CM | POA: Diagnosis not present

## 2012-02-11 DIAGNOSIS — C44319 Basal cell carcinoma of skin of other parts of face: Secondary | ICD-10-CM | POA: Diagnosis not present

## 2012-02-24 ENCOUNTER — Ambulatory Visit
Admission: RE | Admit: 2012-02-24 | Discharge: 2012-02-24 | Disposition: A | Discharge status: Home / Self Care | Payer: Medicare Other | Source: Ambulatory Visit | Attending: Internal Medicine | Admitting: Internal Medicine

## 2012-02-24 DIAGNOSIS — Z1231 Encounter for screening mammogram for malignant neoplasm of breast: Secondary | ICD-10-CM

## 2012-03-02 ENCOUNTER — Other Ambulatory Visit: Payer: Self-pay | Admitting: *Deleted

## 2012-03-02 MED ORDER — METOPROLOL TARTRATE 25 MG PO TABS: ORAL_TABLET | ORAL | Status: DC

## 2012-03-05 DIAGNOSIS — H43819 Vitreous degeneration, unspecified eye: Secondary | ICD-10-CM | POA: Diagnosis not present

## 2012-03-05 DIAGNOSIS — Z961 Presence of intraocular lens: Secondary | ICD-10-CM | POA: Diagnosis not present

## 2012-03-12 DIAGNOSIS — C44319 Basal cell carcinoma of skin of other parts of face: Secondary | ICD-10-CM | POA: Diagnosis not present

## 2012-03-15 ENCOUNTER — Ambulatory Visit (INDEPENDENT_AMBULATORY_CARE_PROVIDER_SITE_OTHER): Payer: Medicare Other | Admitting: Internal Medicine

## 2012-03-15 ENCOUNTER — Encounter: Payer: Self-pay | Admitting: Internal Medicine

## 2012-03-15 VITALS — BP 120/70 | HR 72 | Temp 98.3°F | Resp 16 | Ht 64.0 in | Wt 136.0 lb

## 2012-03-15 DIAGNOSIS — M549 Dorsalgia, unspecified: Secondary | ICD-10-CM | POA: Diagnosis not present

## 2012-03-15 DIAGNOSIS — I10 Essential (primary) hypertension: Secondary | ICD-10-CM | POA: Diagnosis not present

## 2012-03-15 DIAGNOSIS — E785 Hyperlipidemia, unspecified: Secondary | ICD-10-CM

## 2012-03-15 DIAGNOSIS — E559 Vitamin D deficiency, unspecified: Secondary | ICD-10-CM | POA: Diagnosis not present

## 2012-03-15 DIAGNOSIS — K5900 Constipation, unspecified: Secondary | ICD-10-CM

## 2012-03-15 LAB — BASIC METABOLIC PANEL
BUN: 17 mg/dL (ref 6–23)
Chloride: 106 mEq/L (ref 96–112)
GFR: 59.57 mL/min — ABNORMAL LOW (ref >60.00)
Glucose, Bld: 84 mg/dL (ref 70–99)
Potassium: 4.3 mEq/L (ref 3.5–5.1)
Sodium: 141 mEq/L (ref 135–145)

## 2012-03-15 LAB — LIPID PANEL
Cholesterol: 183 mg/dL (ref 0–200)
HDL: 64.8 mg/dL (ref >39.00)
LDL Cholesterol: 101 mg/dL — ABNORMAL HIGH (ref 0–99)
Total CHOL/HDL Ratio: 3
Triglycerides: 84 mg/dL (ref 0.0–149.0)

## 2012-03-15 MED ORDER — MELOXICAM 7.5 MG PO TABS: 7.5000 mg | ORAL_TABLET | Freq: Every day | ORAL | Status: AC

## 2012-03-15 MED ORDER — MELOXICAM 7.5 MG PO TABS: 7.5000 mg | ORAL_TABLET | Freq: Every day | ORAL | Status: DC

## 2012-03-15 NOTE — Patient Instructions (Signed)
Back Exercises Back exercises help treat and prevent back injuries. The goal of back exercises is to increase the strength of your abdominal and back muscles and the flexibility of your back. These exercises should be started when you no longer have back pain. Back exercises include:  Pelvic Tilt. Lie on your back with your knees bent. Tilt your pelvis until the lower part of your back is against the floor. Hold this position 5 to 10 sec and repeat 5 to 10 times.   Knee to Chest. Pull first 1 knee up against your chest and hold for 20 to 30 seconds, repeat this with the other knee, and then both knees. This may be done with the other leg straight or bent, whichever feels better.   Sit-Ups or Curl-Ups. Bend your knees 90 degrees. Start with tilting your pelvis, and do a partial, slow sit-up, lifting your trunk only 30 to 45 degrees off the floor. Take at least 2 to 3 seconds for each sit-up. Do not do sit-ups with your knees out straight. If partial sit-ups are difficult, simply do the above but with only tightening your abdominal muscles and holding it as directed.   Hip-Lift. Lie on your back with your knees flexed 90 degrees. Push down with your feet and shoulders as you raise your hips a couple inches off the floor; hold for 10 seconds, repeat 5 to 10 times.   Back arches. Lie on your stomach, propping yourself up on bent elbows. Slowly press on your hands, causing an arch in your low back. Repeat 3 to 5 times. Any initial stiffness and discomfort should lessen with repetition over time.   Shoulder-Lifts. Lie face down with arms beside your body. Keep hips and torso pressed to floor as you slowly lift your head and shoulders off the floor.  Do not overdo your exercises, especially in the beginning. Exercises may cause you some mild back discomfort which lasts for a few minutes; however, if the pain is more severe, or lasts for more than 15 minutes, do not continue exercises until you see your  caregiver. Improvement with exercise therapy for back problems is slow.  See your caregivers for assistance with developing a proper back exercise program. Document Released: 10/30/2004 Document Revised: 09/11/2011 Document Reviewed: 09/22/2005 [employer] Patient Information 2012 [employer]. 

## 2012-03-15 NOTE — Progress Notes (Signed)
Subjective:    Patient ID: Whitney Manning, female    DOB: Nov 20, 1934, 77 y.o.   MRN: [id number]  HPI Patient is a 77 year old white female who presents for followup of hyperlipidemia needing a lipid panel today with a chief complaint of low back pain with a history of osteoarthritis.  In the past she been on meloxicam for arthritic pain but it had been discontinued and she has developed low back pain.  Low back pain has 2 components one is most probably musculoskeletal and one is more probably constipation related.  She can separate these pains were relieved by bowel movements 1 positional in nature.  She does not do back exercises. Her blood pressure is stable   Review of Systems  Constitutional: Negative for activity change, appetite change and fatigue.  HENT: Negative for ear pain, congestion, neck pain, postnasal drip and sinus pressure.   Eyes: Negative for redness and visual disturbance.  Respiratory: Negative for cough, shortness of breath and wheezing.   Gastrointestinal: Negative for abdominal pain and abdominal distention.  Genitourinary: Negative for dysuria, frequency and menstrual problem.  Musculoskeletal: Positive for back pain. Negative for myalgias, joint swelling and arthralgias.  Skin: Negative for rash and wound.  Neurological: Negative for dizziness, weakness and headaches.  Hematological: Negative for adenopathy. Does not bruise/bleed easily.  Psychiatric/Behavioral: Negative for sleep disturbance and decreased concentration.       Past Medical History  Diagnosis Date  . Hyperlipidemia   . Arthritis   . Osteopenia     History   Social History  . Marital Status: Married    Spouse Name: N/A    Number of Children: N/A  . Years of Education: N/A   Occupational History  . Not on file.   Social History Main Topics  . Smoking status: Never Smoker   . Smokeless tobacco: Not on file  . Alcohol Use: No  . Drug Use: No  . Sexually Active: Yes    Other Topics Concern  . Not on file   Social History Narrative  . No narrative on file    Past Surgical History  Procedure Date  . Colonoscopy   . Cholecystectomy   . 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 Cardiac valve replacement     aortic valve ( pig)    Family History  Problem Relation Age of Onset  . Stroke Mother     No Known Allergies  Current Outpatient Prescriptions on File Prior to Visit  Medication Sig Dispense Refill  . aspirin 325 MG tablet Take 325 mg by mouth daily.        Marland Kitchen atorvastatin (LIPITOR) 20 MG tablet Take 1 tablet (20 mg total) by mouth daily.  30 tablet  11  . Cholecalciferol (VITAMIN D) 2000 UNITS CAPS Take 1 capsule (2,000 Units total) by mouth daily.  30 capsule    . co-enzyme Q-10 30 MG capsule Take 30 mg by mouth daily.        . fish oil-omega-3 fatty acids 1000 MG capsule Take 2 g by mouth 2 (two) times daily.        . metoprolol tartrate (LOPRESSOR) 25 MG tablet 1.5 in AM and 1 in pm       . metoprolol tartrate (LOPRESSOR) 25 MG tablet 1.5 tab in am and 1 in pm  180 tablet  3    BP 120/70  Pulse 72  Temp 98.3 F (36.8 C)  Resp 16  Ht 5'  4" (1.626 m)  Wt 136 lb (61.689 kg)  BMI 23.34 kg/m2    Objective:   Physical Exam  Nursing note and vitals reviewed. Constitutional: She is oriented to person, place, and time. She appears well-developed and well-nourished. No distress.  HENT:  Head: Normocephalic and atraumatic.  Right Ear: External ear normal.  Left Ear: External ear normal.  Nose: Nose normal.  Mouth/Throat: Oropharynx is clear and moist.  Eyes: Conjunctivae and EOM are normal. Pupils are equal, round, and reactive to light.  Neck: Normal range of motion. Neck supple. No JVD present. No tracheal deviation present. No thyromegaly present.  Cardiovascular: Normal rate, regular rhythm, normal heart sounds and intact distal pulses.   No murmur heard. Pulmonary/Chest: Effort normal and breath sounds  normal. She has no wheezes. She exhibits no tenderness.  Abdominal: Soft. Bowel sounds are normal.  Musculoskeletal: Normal range of motion. She exhibits no edema and no tenderness.  Lymphadenopathy:    She has no cervical adenopathy.  Neurological: She is alert and oriented to person, place, and time. She has normal reflexes. No cranial nerve deficit.  Skin: Skin is warm and dry. She is not diaphoretic.  Psychiatric: She has a normal mood and affect. Her behavior is normal.          Assessment & Plan:  Chronic low back pain most probably lumbar spondylosis.  She was given back exercises and refill of the meloxicam.  Hypertension stable a basic metabolic panel will be drawn today.  We will hyperlipidemia on statins a lipid level will be drawn today given her history of coronary bypasses social for her to have a total cholesterol less than 170 and an LDL less than 100.

## 2012-04-06 ENCOUNTER — Other Ambulatory Visit: Payer: Self-pay | Admitting: *Deleted

## 2012-04-06 MED ORDER — SODIUM FLUORIDE 1.1 % DT CREA: 1.0000 "application " | TOPICAL_CREAM | Freq: Every evening | DENTAL | Status: DC

## 2012-04-07 DIAGNOSIS — C089 Malignant neoplasm of major salivary gland, unspecified: Secondary | ICD-10-CM | POA: Diagnosis not present

## 2012-04-07 DIAGNOSIS — C07 Malignant neoplasm of parotid gland: Secondary | ICD-10-CM | POA: Diagnosis not present

## 2012-04-07 DIAGNOSIS — R911 Solitary pulmonary nodule: Secondary | ICD-10-CM | POA: Diagnosis not present

## 2012-04-12 DIAGNOSIS — H9319 Tinnitus, unspecified ear: Secondary | ICD-10-CM | POA: Diagnosis not present

## 2012-04-12 DIAGNOSIS — H903 Sensorineural hearing loss, bilateral: Secondary | ICD-10-CM | POA: Diagnosis not present

## 2012-04-21 ENCOUNTER — Other Ambulatory Visit: Payer: Self-pay | Admitting: Dermatology

## 2012-04-21 DIAGNOSIS — D239 Other benign neoplasm of skin, unspecified: Secondary | ICD-10-CM | POA: Diagnosis not present

## 2012-04-21 DIAGNOSIS — L820 Inflamed seborrheic keratosis: Secondary | ICD-10-CM | POA: Diagnosis not present

## 2012-04-21 DIAGNOSIS — L723 Sebaceous cyst: Secondary | ICD-10-CM | POA: Diagnosis not present

## 2012-04-21 DIAGNOSIS — H61009 Unspecified perichondritis of external ear, unspecified ear: Secondary | ICD-10-CM | POA: Diagnosis not present

## 2012-06-02 DIAGNOSIS — I350 Nonrheumatic aortic (valve) stenosis: Secondary | ICD-10-CM | POA: Insufficient documentation

## 2012-06-02 DIAGNOSIS — E785 Hyperlipidemia, unspecified: Secondary | ICD-10-CM | POA: Diagnosis not present

## 2012-06-02 DIAGNOSIS — I498 Other specified cardiac arrhythmias: Secondary | ICD-10-CM | POA: Diagnosis not present

## 2012-06-02 DIAGNOSIS — I359 Nonrheumatic aortic valve disorder, unspecified: Secondary | ICD-10-CM | POA: Diagnosis not present

## 2012-06-02 DIAGNOSIS — I4891 Unspecified atrial fibrillation: Secondary | ICD-10-CM | POA: Insufficient documentation

## 2012-06-02 DIAGNOSIS — Z9889 Other specified postprocedural states: Secondary | ICD-10-CM | POA: Diagnosis not present

## 2012-06-02 DIAGNOSIS — Z8679 Personal history of other diseases of the circulatory system: Secondary | ICD-10-CM | POA: Diagnosis not present

## 2012-09-15 ENCOUNTER — Encounter: Payer: Medicare Other | Admitting: Internal Medicine

## 2012-09-15 DIAGNOSIS — H61009 Unspecified perichondritis of external ear, unspecified ear: Secondary | ICD-10-CM | POA: Diagnosis not present

## 2012-09-15 DIAGNOSIS — L819 Disorder of pigmentation, unspecified: Secondary | ICD-10-CM | POA: Diagnosis not present

## 2012-09-15 DIAGNOSIS — L739 Follicular disorder, unspecified: Secondary | ICD-10-CM | POA: Diagnosis not present

## 2012-09-15 DIAGNOSIS — L821 Other seborrheic keratosis: Secondary | ICD-10-CM | POA: Diagnosis not present

## 2012-09-16 ENCOUNTER — Encounter: Payer: Self-pay | Admitting: Internal Medicine

## 2012-09-16 ENCOUNTER — Ambulatory Visit (INDEPENDENT_AMBULATORY_CARE_PROVIDER_SITE_OTHER): Payer: Medicare Other | Admitting: Internal Medicine

## 2012-09-16 VITALS — BP 136/80 | HR 72 | Temp 98.0°F | Resp 16 | Ht 64.0 in | Wt 140.0 lb

## 2012-09-16 DIAGNOSIS — T887XXA Unspecified adverse effect of drug or medicament, initial encounter: Secondary | ICD-10-CM

## 2012-09-16 DIAGNOSIS — E785 Hyperlipidemia, unspecified: Secondary | ICD-10-CM | POA: Diagnosis not present

## 2012-09-16 DIAGNOSIS — Z1331 Encounter for screening for depression: Secondary | ICD-10-CM | POA: Diagnosis not present

## 2012-09-16 DIAGNOSIS — I10 Essential (primary) hypertension: Secondary | ICD-10-CM

## 2012-09-16 DIAGNOSIS — M810 Age-related osteoporosis without current pathological fracture: Secondary | ICD-10-CM

## 2012-09-16 DIAGNOSIS — Z0000 Encounter for general adult medical examination without abnormal findings: Secondary | ICD-10-CM | POA: Diagnosis not present

## 2012-09-16 LAB — CBC WITH DIFFERENTIAL/PLATELET
Basophils Relative: 0.5 % (ref 0.0–3.0)
Eosinophils Relative: 2.3 % (ref 0.0–5.0)
Lymphocytes Relative: 18.7 % (ref 12.0–46.0)
MCV: 90.5 fl (ref 78.0–100.0)
Monocytes Absolute: 0.5 10*3/uL (ref 0.1–1.0)
Monocytes Relative: 7.6 % (ref 3.0–12.0)
Neutrophils Relative %: 70.9 % (ref 43.0–77.0)
RBC: 4.36 Mil/uL (ref 3.87–5.11)
WBC: 6.1 10*3/uL (ref 4.5–10.5)

## 2012-09-16 LAB — BASIC METABOLIC PANEL
BUN: 13 mg/dL (ref 6–23)
CO2: 28 mEq/L (ref 19–32)
Calcium: 9.3 mg/dL (ref 8.4–10.5)
Creatinine, Ser: 0.9 mg/dL (ref 0.4–1.2)

## 2012-09-16 LAB — POCT URINALYSIS DIPSTICK
Bilirubin, UA: NEGATIVE
Glucose, UA: NEGATIVE
Ketones, UA: NEGATIVE
Spec Grav, UA: <=1.005

## 2012-09-16 LAB — HEPATIC FUNCTION PANEL
Bilirubin, Direct: 0 mg/dL (ref 0.0–0.3)
Total Protein: 7.9 g/dL (ref 6.0–8.3)

## 2012-09-16 LAB — TSH: TSH: 1.11 u[IU]/mL (ref 0.35–5.50)

## 2012-09-16 LAB — LIPID PANEL
Cholesterol: 210 mg/dL — ABNORMAL HIGH (ref 0–200)
HDL: 62.7 mg/dL (ref >39.00)
VLDL: 16.4 mg/dL (ref 0.0–40.0)

## 2012-09-16 NOTE — Progress Notes (Signed)
Subjective:    Patient ID: Whitney Manning, female    DOB: 07-16-1935, 77 y.o.   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  HENT: Negative for ear pain, congestion, neck pain, postnasal drip and sinus pressure.   Eyes: Negative for redness and visual disturbance.  Respiratory: Negative for cough, shortness of breath and wheezing.   Gastrointestinal: Negative for abdominal pain and abdominal distention.  Genitourinary: Negative for dysuria, frequency and menstrual problem.  Musculoskeletal: Negative for myalgias, joint swelling and arthralgias.  Skin: Negative for rash and wound.  Neurological: Negative for dizziness, weakness and headaches.  Hematological: Negative for adenopathy. Does not bruise/bleed easily.  Psychiatric/Behavioral: Negative for sleep disturbance and decreased concentration.   Past Medical History  Diagnosis Date  . Hyperlipidemia   . Arthritis   . Osteopenia     History   Social History  . Marital Status: Married    Spouse Name: N/A    Number of Children: N/A  . Years of Education: N/A   Occupational History  . Not on file.   Social History Main Topics  . Smoking status: Never Smoker   . Smokeless tobacco: Not on file  . Alcohol Use: No  . Drug Use: No  . Sexually Active: Yes   Other Topics Concern  . Not on file   Social History Narrative  . No narrative on file    Past Surgical History  Procedure Date  . Colonoscopy   . Cholecystectomy   . Salivary gland  surgery for cancer on the left parotid   . Cardiac valve replacement     aortic valve ( pig)    Family History  Problem Relation Age of Onset  . Stroke Mother     No Known Allergies  Current Outpatient Prescriptions on File Prior to Visit  Medication Sig Dispense Refill  . aspirin 325 MG tablet Take 325 mg by mouth daily.        Marland Kitchen atorvastatin (LIPITOR) 20 MG tablet Take 1 tablet (20 mg total) by mouth daily.  30 tablet  11  . Cholecalciferol (VITAMIN D) 2000 UNITS CAPS Take 1 capsule (2,000 Units total) by mouth daily.  30 capsule    . co-enzyme Q-10 30 MG capsule Take 30 mg by mouth daily.        . fish oil-omega-3 fatty acids 1000 MG capsule Take 2 g by mouth 2 (two) times daily.        . meloxicam (MOBIC) 7.5 MG tablet Take 1 tablet (7.5 mg total) by mouth daily.  30 tablet  6  . metoprolol tartrate (LOPRESSOR) 25 MG tablet 1.5 tab in am and 1 in pm  180 tablet  3  . sodium fluoride (SF 5000 PLUS) 1.1 % CREA dental cream Place 1 application onto teeth every evening.  51 g  11  . [DISCONTINUED] metoprolol tartrate (LOPRESSOR) 25 MG tablet 1.5 in AM and 1 in pm         BP 136/80  Pulse 72  Temp 98 F (36.7  C)  Resp 16  Ht 5\' 4"  (1.626 m)  Wt 140 lb (63.504 kg)  BMI 24.03 kg/m2  \     Objective:   Physical Exam  Constitutional: She is oriented to person, place, and time. She appears well-developed and well-nourished. No distress.  HENT:  Head: Normocephalic and atraumatic.  Right Ear: External ear normal.  Left Ear: External ear normal.  Nose: Nose normal.  Mouth/Throat: Oropharynx is clear and moist.  Eyes: Conjunctivae normal and EOM are normal. Pupils are equal, round, and reactive to light.  Neck: Normal range of motion. Neck supple. No JVD present. No tracheal deviation present. No thyromegaly present.  Cardiovascular: Normal rate, regular rhythm and intact distal pulses.   Murmur heard. Pulmonary/Chest: Effort normal and breath sounds normal. She has no  wheezes. She exhibits no tenderness.  Abdominal: Soft. Bowel sounds are normal.  Musculoskeletal: Normal range of motion. She exhibits no edema and no tenderness.  Lymphadenopathy:    She has no cervical adenopathy.  Neurological: She is alert and oriented to person, place, and time. She has normal reflexes. No cranial nerve deficit.  Skin: Skin is warm and dry. She is not diaphoretic.  Psychiatric: She has a normal mood and affect. Her behavior is normal.          Assessment & Plan:  Pressure stable on current medications.  We'll continue Lipitor and the omega-3 supplementation and monitor a lipid and liver today.  We'll monitor basic metabolic panel to look at her renal function we will measure a vitamin D level because of her history of osteoporosis on vitamin D and a CBC differential to monitor for potential side effects of medications.  Reviewed the blood work from the July office visit Subjective:    Whitney Manning is a 77 y.o. female who presents for Medicare Annual/Subsequent preventive examination.  Preventive Screening-Counseling & Management  Tobacco History  Smoking status  . Never Smoker   Smokeless tobacco  . Not on file     Problems Prior to Visit 1.   Current Problems (verified) Patient Active Problem List  Diagnosis  . NEOPLASM, MALIGNANT, PAROTID  . HYPERLIPIDEMIA  . CHRONIC LYMPHADENITIS  . HYPERTENSION, ESSENTIAL  . BRADYCARDIA  . TEMPOROMANDIBULAR JOINT DISORDER  . OSTEOARTHRITIS  . OSTEOPENIA  . SYNCOPE  . UNS ADVRS EFF UNS RX MEDICINAL&BIOLOGICAL SBSTNC  . AORTIC VALVE REPLACEMENT, HX OF    Medications Prior to Visit Current Outpatient Prescriptions on File Prior to Visit  Medication Sig Dispense Refill  . aspirin 325 MG tablet Take 325 mg by mouth daily.        Marland Kitchen atorvastatin (LIPITOR) 20 MG tablet Take 1 tablet (20 mg total) by mouth daily.  30 tablet  11  . Cholecalciferol (VITAMIN D) 2000 UNITS CAPS Take 1 capsule (2,000 Units  total) by mouth daily.  30 capsule    . co-enzyme Q-10 30 MG capsule Take 30 mg by mouth daily.        . fish oil-omega-3 fatty acids 1000 MG capsule Take 2 g by mouth 2 (two) times daily.        . meloxicam (MOBIC) 7.5 MG tablet Take 1 tablet (7.5 mg total) by mouth daily.  30 tablet  6  . metoprolol tartrate (LOPRESSOR) 25 MG tablet 1.5 tab in am and 1 in pm  180 tablet  3  . sodium fluoride (SF 5000 PLUS) 1.1 % CREA dental cream Place 1 application onto teeth every evening.  51 g  11  . [DISCONTINUED] metoprolol tartrate (LOPRESSOR) 25 MG tablet 1.5 in AM and 1 in pm         Current Medications (verified) Current Outpatient Prescriptions  Medication Sig Dispense Refill  . aspirin 325 MG tablet Take 325 mg by mouth daily.        Marland Kitchen atorvastatin (LIPITOR) 20 MG tablet Take 1 tablet (20 mg total) by mouth daily.  30 tablet  11  . Cholecalciferol (VITAMIN D) 2000 UNITS CAPS Take 1 capsule (2,000 Units total) by mouth daily.  30 capsule    . co-enzyme Q-10 30 MG capsule Take 30 mg by mouth daily.        . fish oil-omega-3 fatty acids 1000 MG capsule Take 2 g by mouth 2 (two) times daily.        . meloxicam (MOBIC) 7.5 MG tablet Take 1 tablet (7.5 mg total) by mouth daily.  30 tablet  6  . metoprolol tartrate (LOPRESSOR) 25 MG tablet 1.5 tab in am and 1 in pm  180 tablet  3  . sodium fluoride (SF 5000 PLUS) 1.1 % CREA dental cream Place 1 application onto teeth every evening.  51 g  11  . [DISCONTINUED] metoprolol tartrate (LOPRESSOR) 25 MG tablet 1.5 in AM and 1 in pm          Allergies (verified) Review of patient's allergies indicates no known allergies.   PAST HISTORY  Family History Family History  Problem Relation Age of Onset  . Stroke Mother     Social History History  Substance Use Topics  . Smoking status: Never Smoker   . Smokeless tobacco: Not on file  . Alcohol Use: No     Are there smokers in your home (other than you)? No  Risk Factors Current exercise habits:  The patient does not participate in regular exercise at present.  Dietary issues discussed: lipid lowering  Cardiac risk factors: advanced age (older than 2 for men, 48 for women).  Depression Screen (Note: if answer to either of the following is "Yes", a more complete depression screening is indicated)   Over the past two weeks, have you felt down, depressed or hopeless? No  Over the past two weeks, have you felt little interest or pleasure in doing things? No  Have you lost interest or pleasure in daily life? No  Do you often feel hopeless? No  Do you cry easily over simple problems? No  Activities of Daily Living In your present state of health, do you have any difficulty performing the following activities?:  Driving? No Managing money?  No Feeding yourself? No Getting from bed to chair? No Climbing a flight of stairs? No Preparing food and eating?: No Bathing or showering? No Getting dressed: No Getting to the toilet? No Using the toilet:No Moving around from place to place: No In the past year have you fallen or had a near fall?:No   Are you sexually active?  Yes  Do you have more than one partner?  No  Hearing Difficulties: No Do you often ask people to speak up or repeat themselves? No Do you experience ringing or noises in your ears? No Do you have difficulty understanding soft or whispered voices? No   Do you feel that you have a problem with memory? No  Do you often misplace items? No  Do you feel safe at home?  Yes  Cognitive Testing  Alert? Yes  Normal Appearance?Yes  Oriented to person? Yes  Place?  Yes   Time? Yes  Recall of three objects?  Yes  Can perform simple calculations? Yes  Displays appropriate judgment?Yes  Can read the correct time from a watch face?Yes   Advanced Directives have been discussed with the patient? Yes  List the Names of Other Physician/Practitioners you currently use: 1.    Indicate any recent Medical Services you may have  received from other than [facility] providers in the past year (date may be approximate).  Immunization History  Administered Date(s) Administered  . Influenza Split 07/16/2011  . Influenza Whole 10/06/2001, 07/04/2009  . Pneumococcal Polysaccharide 07/04/2009  . Td 10/06/2001  . Zoster 03/03/2007    Screening Tests Health Maintenance  Topic Date Due  . Tetanus/tdap  10/07/2011  . Influenza Vaccine  06/06/2012  . Colonoscopy  10/17/2015  . Pneumococcal Polysaccharide Vaccine Age 31 And Over  Completed  . Zostavax  Completed    All answers were reviewed with the patient and necessary referrals were made:  Carrie Mew, MD   09/16/2012   History reviewed: allergies, current medications, past family history, past medical history, past social history, past surgical history and problem list  Review of Systems Review of systems not obtained due to patient factors.  ROS in the acute visit portion of the documentation   Objective:     Vision by Snellen chart: right eye:20/20, left eye:20/20  Body mass index is 24.03 kg/(m^2). BP 136/80  Pulse 72  Temp 98 F (36.7 C)  Resp 16  Ht 5\' 4"  (1.626 m)  Wt 140 lb (63.504 kg)  BMI 24.03 kg/m2  BP 136/80  Pulse 72  Temp 98 F (36.7 C)  Resp 16  Ht 5\' 4"  (1.626 m)  Wt 140 lb (63.504 kg)  BMI 24.03 kg/m2  General Appearance:    Alert, cooperative, no distress, appears stated age  Head:    Normocephalic, without obvious abnormality, atraumatic  Eyes:    PERRL, conjunctiva/corneas clear, EOM's intact, fundi    benign, both eyes  Ears:    Normal TM's and external ear canals, both ears  Nose:   Nares normal, septum midline, mucosa normal, no drainage    or sinus tenderness  Throat:   Lips, mucosa, and tongue normal; teeth and gums normal  Neck:   Supple, symmetrical, trachea midline, no adenopathy;    thyroid:  no enlargement/tenderness/nodules; no carotid   bruit or JVD  Back:     Symmetric, no curvature, ROM normal, no CVA  tenderness  Lungs:     Clear to auscultation bilaterally, respirations unlabored  Chest Wall:    No tenderness or deformity   Heart:    Regular rate and rhythm, S1 and S2 normal, no murmur, rub   or gallop  Breast Exam:    No tenderness, masses, or nipple abnormality  Abdomen:     Soft, non-tender, bowel sounds active all four quadrants,    no masses, no organomegaly  Genitalia:    Normal female without lesion, discharge or tenderness  Rectal:    Normal tone, normal prostate, no masses or tenderness;   guaiac negative stool  Extremities:   Extremities normal, atraumatic, no cyanosis or edema  Pulses:   2+ and symmetric all extremities  Skin:   Skin color, texture, turgor normal, no rashes or lesions  Lymph nodes:   Cervical, supraclavicular, and axillary nodes normal  Neurologic:   CNII-XII intact, normal strength, sensation and reflexes    throughout       Assessment:  This is a routine physical examination for this healthy  Female. Reviewed all health maintenance protocols including mammography colonoscopy bone density and reviewed appropriate screening labs. Her immunization history was reviewed as well as her current medications and allergies refills of her chronic medications were given and the plan for yearly health maintenance was discussed all orders and referrals were made as appropriate.      Plan:     During the course of the visit the patient was educated and counseled about appropriate screening and preventive services including:    Influenza vaccine  Screening mammography  Bone densitometry screening  Diet review for nutrition referral? Yes ____  Not Indicated ____   Patient Instructions (the written plan) was given to the patient.  Medicare Attestation I have personally reviewed: The patient's medical and social history Their use of alcohol, tobacco or illicit drugs Their current medications and supplements The patient's functional ability including  ADLs,fall risks, home safety risks, cognitive, and hearing and visual impairment Diet and physical activities Evidence for depression or mood disorders  The patient's weight, height, BMI, and visual acuity have been recorded in the chart.  I have made referrals, counseling, and provided education to the patient based on review of the above and I have provided the patient with a written personalized care plan for preventive services.     Carrie Mew, MD   09/16/2012

## 2012-09-16 NOTE — Patient Instructions (Signed)
The patient is instructed to continue all medications as prescribed. Schedule followup with check out clerk upon leaving the clinic  

## 2012-09-17 LAB — VITAMIN D 25 HYDROXY (VIT D DEFICIENCY, FRACTURES): Vit D, 25-Hydroxy: 38 ng/mL (ref 30–89)

## 2012-10-13 DIAGNOSIS — J984 Other disorders of lung: Secondary | ICD-10-CM | POA: Diagnosis not present

## 2012-10-13 DIAGNOSIS — C089 Malignant neoplasm of major salivary gland, unspecified: Secondary | ICD-10-CM | POA: Diagnosis not present

## 2012-10-13 DIAGNOSIS — Z85819 Personal history of malignant neoplasm of unspecified site of lip, oral cavity, and pharynx: Secondary | ICD-10-CM | POA: Diagnosis not present

## 2012-10-25 DIAGNOSIS — Z23 Encounter for immunization: Secondary | ICD-10-CM | POA: Diagnosis not present

## 2012-12-06 ENCOUNTER — Other Ambulatory Visit: Payer: Self-pay | Admitting: *Deleted

## 2012-12-06 MED ORDER — METOPROLOL TARTRATE 25 MG PO TABS: ORAL_TABLET | ORAL | Status: DC

## 2013-02-11 DIAGNOSIS — L819 Disorder of pigmentation, unspecified: Secondary | ICD-10-CM | POA: Diagnosis not present

## 2013-02-11 DIAGNOSIS — L570 Actinic keratosis: Secondary | ICD-10-CM | POA: Diagnosis not present

## 2013-02-11 DIAGNOSIS — D239 Other benign neoplasm of skin, unspecified: Secondary | ICD-10-CM | POA: Diagnosis not present

## 2013-02-11 DIAGNOSIS — L821 Other seborrheic keratosis: Secondary | ICD-10-CM | POA: Diagnosis not present

## 2013-02-11 DIAGNOSIS — Z85828 Personal history of other malignant neoplasm of skin: Secondary | ICD-10-CM | POA: Diagnosis not present

## 2013-02-11 DIAGNOSIS — D1801 Hemangioma of skin and subcutaneous tissue: Secondary | ICD-10-CM | POA: Diagnosis not present

## 2013-02-14 ENCOUNTER — Other Ambulatory Visit: Payer: Self-pay | Admitting: Internal Medicine

## 2013-02-14 DIAGNOSIS — Z1231 Encounter for screening mammogram for malignant neoplasm of breast: Secondary | ICD-10-CM

## 2013-02-18 ENCOUNTER — Ambulatory Visit (INDEPENDENT_AMBULATORY_CARE_PROVIDER_SITE_OTHER): Payer: Medicare Other | Admitting: Family Medicine

## 2013-02-18 ENCOUNTER — Encounter: Payer: Self-pay | Admitting: Family Medicine

## 2013-02-18 VITALS — BP 124/80 | Temp 98.3°F | Wt 142.0 lb

## 2013-02-18 DIAGNOSIS — R350 Frequency of micturition: Secondary | ICD-10-CM

## 2013-02-18 DIAGNOSIS — M545 Low back pain: Secondary | ICD-10-CM | POA: Diagnosis not present

## 2013-02-18 LAB — POCT URINALYSIS DIPSTICK
Bilirubin, UA: NEGATIVE
Ketones, UA: NEGATIVE
Protein, UA: NEGATIVE
Spec Grav, UA: 1.01
pH, UA: 6.5

## 2013-02-18 NOTE — Progress Notes (Signed)
Chief Complaint  Patient presents with  . Back Pain    urinary frequency     HPI:  Acute visit for dysuria: -started: 1 week ago -symptoms:  Mild urinary frequency and urgency, a little low back pain - more  Chronic issue from working in her flower beds, mild -denies: fever, chills, flank pain, vomiting, hematuria, bowel or bladder incontinence, weight loss, vaginal symptoms, radiation of pain, weakness or numbness in LE -has tried cranberry juice -hx of: UTIs in the past  ROS: See pertinent positives and negatives per HPI.  Past Medical History  Diagnosis Date  . Hyperlipidemia   . Arthritis   . Osteopenia     Family History  Problem Relation Age of Onset  . Stroke Mother     History   Social History  . Marital Status: Married    Spouse Name: N/A    Number of Children: N/A  . Years of Education: N/A   Social History Main Topics  . Smoking status: Never Smoker   . Smokeless tobacco: None  . Alcohol Use: No  . Drug Use: No  . Sexually Active: Yes   Other Topics Concern  . None   Social History Narrative  . None    Current outpatient prescriptions:aspirin 325 MG tablet, Take 325 mg by mouth daily.  , Disp: , Rfl: ;  atorvastatin (LIPITOR) 20 MG tablet, Take 1 tablet (20 mg total) by mouth daily., Disp: 30 tablet, Rfl: 11;  Cholecalciferol (VITAMIN D) 2000 UNITS CAPS, Take 1 capsule (2,000 Units total) by mouth daily., Disp: 30 capsule, Rfl: ;  co-enzyme Q-10 30 MG capsule, Take 30 mg by mouth daily.  , Disp: , Rfl:  fish oil-omega-3 fatty acids 1000 MG capsule, Take 2 g by mouth 2 (two) times daily.  , Disp: , Rfl: ;  meloxicam (MOBIC) 7.5 MG tablet, Take 1 tablet (7.5 mg total) by mouth daily., Disp: 30 tablet, Rfl: 6;  metoprolol tartrate (LOPRESSOR) 25 MG tablet, 1.5 tab in am and 1 in pm, Disp: 180 tablet, Rfl: 3;  sodium fluoride (SF 5000 PLUS) 1.1 % CREA dental cream, Place 1 application onto teeth every evening., Disp: 51 g, Rfl: 11  EXAM:  Filed Vitals:    02/18/13 0912  BP: 124/80  Temp: 98.3 F (36.8 C)    Body mass index is 24.36 kg/(m^2).  GENERAL: vitals reviewed and listed above, alert, oriented, appears well hydrated and in no acute distress  HEENT: atraumatic, conjunttiva clear, no obvious abnormalities on inspection of external nose and ears  NECK: no obvious masses on inspection  LUNGS: clear to auscultation bilaterally, no wheezes, rales or rhonchi, good air movement  CV: HRRR, no peripheral edema  ABD: soft, NTTP, BS+, no CVA TTP  MS: moves all extremities without noticeable abnormality TTP L SI joint area and lower lumbar paraspinal muscles on L  PSYCH: pleasant and cooperative, no obvious depression or anxiety  ASSESSMENT AND PLAN:  Discussed the following assessment and plan:  Urinary frequency - Plan: POCT urinalysis dipstick, POCT urinalysis dipstick  Low back pain  -Udip not very convincing, culture pending - discussed with pt and options for empiric abx versus waiting on culture - she would like to wait as symptoms are mild and will drink plenty of water, blueberry and cranberry juice. Return precautions. UC precautions. -suspect DDD, possible sacroiliitis for chronic back pain - advised heat, stretching, gentle activity, tylenol and follow up with PCP if worsens or persists. -Patient advised to return or  notify a doctor immediately if symptoms worsen or persist or new concerns arise.  There are no Patient Instructions on file for this visit.   Colin Benton R.

## 2013-02-18 NOTE — Addendum Note (Signed)
Addended by: Azucena Freed on: 02/18/2013 09:51 AM   Modules accepted: Orders

## 2013-02-21 LAB — URINE CULTURE: Colony Count: 30000

## 2013-02-22 NOTE — Progress Notes (Signed)
Quick Note:  Attempted to call pt; number busy. Will call at a later time. ______

## 2013-02-22 NOTE — Progress Notes (Signed)
Quick Note:  Called and spoke with pt and pt is aware. Pt states her back is still hurting but not as bad as before. Pt aware to call back to schedule an appt for another urine culture if needed. ______

## 2013-03-07 DIAGNOSIS — H43819 Vitreous degeneration, unspecified eye: Secondary | ICD-10-CM | POA: Diagnosis not present

## 2013-03-07 DIAGNOSIS — Z961 Presence of intraocular lens: Secondary | ICD-10-CM | POA: Diagnosis not present

## 2013-03-07 DIAGNOSIS — H2640 Unspecified secondary cataract: Secondary | ICD-10-CM | POA: Diagnosis not present

## 2013-03-10 ENCOUNTER — Ambulatory Visit
Admission: RE | Admit: 2013-03-10 | Discharge: 2013-03-10 | Disposition: A | Discharge status: Home / Self Care | Payer: Medicare Other | Source: Ambulatory Visit | Attending: Internal Medicine | Admitting: Internal Medicine

## 2013-03-10 DIAGNOSIS — Z1231 Encounter for screening mammogram for malignant neoplasm of breast: Secondary | ICD-10-CM

## 2013-03-21 ENCOUNTER — Ambulatory Visit: Payer: Medicare Other | Admitting: Internal Medicine

## 2013-03-22 ENCOUNTER — Encounter: Payer: Self-pay | Admitting: Internal Medicine

## 2013-03-22 ENCOUNTER — Ambulatory Visit (INDEPENDENT_AMBULATORY_CARE_PROVIDER_SITE_OTHER): Payer: Medicare Other | Admitting: Internal Medicine

## 2013-03-22 VITALS — BP 106/70 | HR 72 | Temp 98.3°F | Resp 16 | Ht 64.0 in | Wt 140.0 lb

## 2013-03-22 DIAGNOSIS — E785 Hyperlipidemia, unspecified: Secondary | ICD-10-CM

## 2013-03-22 DIAGNOSIS — I10 Essential (primary) hypertension: Secondary | ICD-10-CM | POA: Diagnosis not present

## 2013-03-22 DIAGNOSIS — Z954 Presence of other heart-valve replacement: Secondary | ICD-10-CM

## 2013-03-22 DIAGNOSIS — Z952 Presence of prosthetic heart valve: Secondary | ICD-10-CM

## 2013-03-22 MED ORDER — ATORVASTATIN CALCIUM 20 MG PO TABS: 20.0000 mg | ORAL_TABLET | Freq: Every day | ORAL | Status: DC

## 2013-03-22 NOTE — Progress Notes (Signed)
Subjective:    Patient ID: Whitney Manning, female    DOB: 06-08-1935, 78 y.o.   MRN: [id number]  HPI  Patient is a 78 year old female followed for history of aortic valve replacement hypertension and hyperlipidemia.  She has been inconsistent in her utilization of her Lipitor and it shows her LDL level.  We discussed the need to take Lipitor on a regular basis we will request that she take it every other day and coenzyme Q10 100 every other day blood pressure stable her current medications. Physical examination for valve replacement today  Review of Systems  Constitutional: Negative for activity change, appetite change and fatigue.  HENT: Negative for ear pain, congestion, neck pain, postnasal drip and sinus pressure.   Eyes: Negative for redness and visual disturbance.  Respiratory: Negative for cough, shortness of breath and wheezing.   Cardiovascular: Negative for chest pain and leg swelling.  Gastrointestinal: Negative for abdominal pain and abdominal distention.  Genitourinary: Negative for dysuria, frequency and menstrual problem.  Musculoskeletal: Negative for myalgias, joint swelling and arthralgias.  Skin: Negative for rash and wound.  Neurological: Positive for dizziness and weakness. Negative for headaches.  Hematological: Negative for adenopathy. Does not bruise/bleed easily.  Psychiatric/Behavioral: Negative for sleep disturbance and decreased concentration.   Past Medical History  Diagnosis Date  . Hyperlipidemia   . Arthritis   . Osteopenia     History   Social History  . Marital Status: Married    Spouse Name: N/A    Number of Children: N/A  . Years of Education: N/A   Occupational History  . Not on file.   Social History Main Topics  . Smoking status: Never Smoker   . Smokeless tobacco: Not on file  . Alcohol Use: No  . Drug Use: No  . Sexually Active: Yes   Other Topics Concern  . Not on file   Social History Narrative  . No narrative on file     Past Surgical History  Procedure Laterality Date  . Colonoscopy    . Cholecystectomy    . 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 Cardiac valve replacement      aortic valve ( pig)    Family History  Problem Relation Age of Onset  . Stroke Mother     No Known Allergies  Current Outpatient Prescriptions on File Prior to Visit  Medication Sig Dispense Refill  . aspirin 325 MG tablet Take 325 mg by mouth daily.        . Cholecalciferol (VITAMIN D) 2000 UNITS CAPS Take 1 capsule (2,000 Units total) by mouth daily.  30 capsule    . co-enzyme Q-10 30 MG capsule Take 100 mg by mouth every other day.       . fish oil-omega-3 fatty acids 1000 MG capsule Take 2 g by mouth 2 (two) times daily.        . metoprolol tartrate (LOPRESSOR) 25 MG tablet 1.5 tab in am and 1 in pm  180 tablet  3  . sodium fluoride (SF 5000 PLUS) 1.1 % CREA dental cream Place 1 application onto teeth every evening.  51 g  11   No current facility-administered medications on file prior to visit.    BP 106/70  Pulse 72  Temp(Src) 98.3 F (36.8 C)  Resp 16  Ht 5\' 4"  (1.626 m)  Wt 140 lb (63.504 kg)  BMI 24.02 kg/m2       Objective:   Physical  Exam  Constitutional: She is oriented to person, place, and time. She appears well-developed and well-nourished. No distress.  HENT:  Head: Normocephalic and atraumatic.  Right Ear: External ear normal.  Left Ear: External ear normal.  Nose: Nose normal.  Mouth/Throat: Oropharynx is clear and moist.  Eyes: Conjunctivae and EOM are normal. Pupils are equal, round, and reactive to light.  Neck: Normal range of motion. Neck supple. No JVD present. No tracheal deviation present. No thyromegaly present.  Cardiovascular: Normal rate and regular rhythm.   Murmur heard. Mechanical sounds  Pulmonary/Chest: Effort normal and breath sounds normal. She has no wheezes. She exhibits no tenderness.  Abdominal: Soft. Bowel sounds are normal.   Musculoskeletal: Normal range of motion. She exhibits no edema and no tenderness.  Lymphadenopathy:    She has no cervical adenopathy.  Neurological: She is alert and oriented to person, place, and time. She has normal reflexes. No cranial nerve deficit.  Skin: Skin is warm and dry. She is not diaphoretic.  Psychiatric: She has a normal mood and affect. Her behavior is normal.          Assessment & Plan:  AK on back Stable HTN  stable VR Neck tender but no mass

## 2013-03-22 NOTE — Patient Instructions (Signed)
Take the lipitor and the coezyme q 10 every other day

## 2013-04-07 ENCOUNTER — Other Ambulatory Visit: Payer: Self-pay | Admitting: *Deleted

## 2013-04-07 MED ORDER — SODIUM FLUORIDE 1.1 % DT CREA: 1.0000 "application " | TOPICAL_CREAM | Freq: Every evening | DENTAL | Status: DC

## 2013-04-25 DIAGNOSIS — C089 Malignant neoplasm of major salivary gland, unspecified: Secondary | ICD-10-CM | POA: Diagnosis not present

## 2013-04-25 DIAGNOSIS — Z85819 Personal history of malignant neoplasm of unspecified site of lip, oral cavity, and pharynx: Secondary | ICD-10-CM | POA: Diagnosis not present

## 2013-04-25 DIAGNOSIS — R911 Solitary pulmonary nodule: Secondary | ICD-10-CM | POA: Diagnosis not present

## 2013-05-25 DIAGNOSIS — I359 Nonrheumatic aortic valve disorder, unspecified: Secondary | ICD-10-CM | POA: Diagnosis not present

## 2013-05-25 DIAGNOSIS — I498 Other specified cardiac arrhythmias: Secondary | ICD-10-CM | POA: Diagnosis not present

## 2013-06-03 DIAGNOSIS — I059 Rheumatic mitral valve disease, unspecified: Secondary | ICD-10-CM | POA: Diagnosis not present

## 2013-06-03 DIAGNOSIS — I359 Nonrheumatic aortic valve disorder, unspecified: Secondary | ICD-10-CM | POA: Diagnosis not present

## 2013-07-28 DIAGNOSIS — L819 Disorder of pigmentation, unspecified: Secondary | ICD-10-CM | POA: Diagnosis not present

## 2013-07-28 DIAGNOSIS — L820 Inflamed seborrheic keratosis: Secondary | ICD-10-CM | POA: Diagnosis not present

## 2013-07-28 DIAGNOSIS — Z85828 Personal history of other malignant neoplasm of skin: Secondary | ICD-10-CM | POA: Diagnosis not present

## 2013-09-12 ENCOUNTER — Other Ambulatory Visit: Payer: Self-pay | Admitting: *Deleted

## 2013-09-12 MED ORDER — SODIUM FLUORIDE 1.1 % DT CREA: 1.0000 "application " | TOPICAL_CREAM | Freq: Every evening | DENTAL | Status: DC

## 2013-09-21 ENCOUNTER — Encounter: Payer: Self-pay | Admitting: Internal Medicine

## 2013-09-21 ENCOUNTER — Ambulatory Visit (INDEPENDENT_AMBULATORY_CARE_PROVIDER_SITE_OTHER): Payer: Medicare Other | Admitting: Internal Medicine

## 2013-09-21 VITALS — BP 130/80 | HR 72 | Temp 98.0°F | Resp 16 | Ht 64.0 in | Wt 138.0 lb

## 2013-09-21 DIAGNOSIS — T887XXA Unspecified adverse effect of drug or medicament, initial encounter: Secondary | ICD-10-CM | POA: Diagnosis not present

## 2013-09-21 DIAGNOSIS — Z23 Encounter for immunization: Secondary | ICD-10-CM | POA: Diagnosis not present

## 2013-09-21 DIAGNOSIS — Z0000 Encounter for general adult medical examination without abnormal findings: Secondary | ICD-10-CM | POA: Diagnosis not present

## 2013-09-21 DIAGNOSIS — I10 Essential (primary) hypertension: Secondary | ICD-10-CM

## 2013-09-21 DIAGNOSIS — E785 Hyperlipidemia, unspecified: Secondary | ICD-10-CM | POA: Diagnosis not present

## 2013-09-21 LAB — POCT URINALYSIS DIPSTICK
Blood, UA: NEGATIVE
Glucose, UA: NEGATIVE
Nitrite, UA: NEGATIVE
Protein, UA: NEGATIVE
Urobilinogen, UA: 0.2
pH, UA: 5.5

## 2013-09-21 LAB — CBC WITH DIFFERENTIAL/PLATELET
Basophils Absolute: 0 10*3/uL (ref 0.0–0.1)
Basophils Relative: 0.5 % (ref 0.0–3.0)
Eosinophils Absolute: 0.1 10*3/uL (ref 0.0–0.7)
Hemoglobin: 13.2 g/dL (ref 12.0–15.0)
Lymphocytes Relative: 21.3 % (ref 12.0–46.0)
MCHC: 34.1 g/dL (ref 30.0–36.0)
Neutro Abs: 4.3 10*3/uL (ref 1.4–7.7)
Neutrophils Relative %: 68.3 % (ref 43.0–77.0)
Platelets: 208 10*3/uL (ref 150.0–400.0)
RBC: 4.28 Mil/uL (ref 3.87–5.11)
RDW: 13.3 % (ref 11.5–14.6)
WBC: 6.3 10*3/uL (ref 4.5–10.5)

## 2013-09-21 LAB — BASIC METABOLIC PANEL
Calcium: 9.4 mg/dL (ref 8.4–10.5)
Chloride: 103 mEq/L (ref 96–112)
Creatinine, Ser: 0.8 mg/dL (ref 0.4–1.2)
Potassium: 4 mEq/L (ref 3.5–5.1)

## 2013-09-21 LAB — LIPID PANEL
HDL: 64.1 mg/dL (ref >39.00)
LDL Cholesterol: 113 mg/dL — ABNORMAL HIGH (ref 0–99)
Total CHOL/HDL Ratio: 3
Triglycerides: 62 mg/dL (ref 0.0–149.0)

## 2013-09-21 LAB — HEPATIC FUNCTION PANEL
ALT: 19 U/L (ref 0–35)
AST: 27 U/L (ref 0–37)
Bilirubin, Direct: 0.1 mg/dL (ref 0.0–0.3)
Total Bilirubin: 0.9 mg/dL (ref 0.3–1.2)

## 2013-09-21 LAB — TSH: TSH: 1.39 u[IU]/mL (ref 0.35–5.50)

## 2013-09-21 NOTE — Progress Notes (Signed)
Pre visit review using our clinic review tool, if applicable. No additional management support is needed unless otherwise documented below in the visit note. 

## 2013-09-21 NOTE — Progress Notes (Signed)
Subjective:    Patient ID: Whitney Manning, female    DOB: 12-May-1935, 78 y.o.   MRN: [id number]  HPI Back pain in AM upon awakening that improves with movement but can be flared with lifting  wellness issues and immunizations  Follow up for lipid management on lipitor Lopressor for HTN mild And fish oil   Review of Systems  Constitutional: Negative for activity change, appetite change and fatigue.  HENT: Positive for sneezing. Negative for congestion, ear pain and postnasal drip.   Eyes: Negative for redness and visual disturbance.  Respiratory: Negative for cough, shortness of breath and wheezing.   Gastrointestinal: Negative for abdominal pain and abdominal distention.  Genitourinary: Negative for dysuria, frequency and menstrual problem.  Musculoskeletal: Positive for back pain, joint swelling and neck pain. Negative for arthralgias and myalgias.  Skin: Negative for rash and wound.  Neurological: Negative for dizziness, weakness and headaches.  Hematological: Negative for adenopathy. Does not bruise/bleed easily.  Psychiatric/Behavioral: Negative for sleep disturbance and decreased concentration.   Past Medical History  Diagnosis Date  . Hyperlipidemia   . Arthritis   . Osteopenia     History   Social History  . Marital Status: Married    Spouse Name: N/A    Number of Children: N/A  . Years of Education: N/A   Occupational History  . Not on file.   Social History Main Topics  . Smoking status: Never Smoker   . Smokeless tobacco: Not on file  . Alcohol Use: No  . Drug Use: No  . Sexual Activity: Yes   Other Topics Concern  . Not on file   Social History Narrative  . No narrative on file    Past Surgical History  Procedure Laterality Date  . Colonoscopy    . Cholecystectomy    . 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 Cardiac valve replacement      aortic valve ( pig)    Family History  Problem Relation Age of Onset  .  Stroke Mother     No Known Allergies  Current Outpatient Prescriptions on File Prior to Visit  Medication Sig Dispense Refill  . aspirin 325 MG tablet Take 325 mg by mouth daily.        Marland Kitchen atorvastatin (LIPITOR) 20 MG tablet Take 1 tablet (20 mg total) by mouth daily.  30 tablet  11  . Cholecalciferol (VITAMIN D) 2000 UNITS CAPS Take 1 capsule (2,000 Units total) by mouth daily.  30 capsule    . co-enzyme Q-10 30 MG capsule Take 100 mg by mouth every other day.       . fish oil-omega-3 fatty acids 1000 MG capsule Take 2 g by mouth 2 (two) times daily.        . metoprolol tartrate (LOPRESSOR) 25 MG tablet 1.5 tab in am and 1 in pm  180 tablet  3  . sodium fluoride (SF 5000 PLUS) 1.1 % CREA dental cream Place 1 application onto teeth every evening.  57 g  11   No current facility-administered medications on file prior to visit.    BP 130/80  Pulse 72  Temp(Src) 98 F (36.7 C)  Resp 16  Ht 5\' 4"  (1.626 m)  Wt 138 lb (62.596 kg)  BMI 23.68 kg/m2        Objective:   Physical Exam  Nursing note and vitals reviewed. Constitutional: She is oriented to person, place, and time. She appears well-developed  and well-nourished. No distress.  HENT:  Head: Normocephalic and atraumatic.  Eyes: Conjunctivae and EOM are normal. Pupils are equal, round, and reactive to light.  Neck: Normal range of motion. Neck supple. No JVD present. No tracheal deviation present. No thyromegaly present.  Cardiovascular: Normal rate, regular rhythm and intact distal pulses.   Pulmonary/Chest: Effort normal and breath sounds normal. She has no wheezes. She exhibits no tenderness.  Abdominal: Soft. Bowel sounds are normal.  Musculoskeletal: Normal range of motion. She exhibits no edema and no tenderness.  Lymphadenopathy:    She has no cervical adenopathy.  Neurological: She is alert and oriented to person, place, and time. She has normal reflexes. No cranial nerve deficit.  Skin: Skin is warm and dry. She is  not diaphoretic.  Psychiatric: She has a normal mood and affect. Her behavior is normal.          Assessment & Plan:  Labs ordered for lipid, liver, bmet and cbc HTN stable Lipids have been historically controlled No chest pain Mild OA  Subjective:    Whitney Manning is a 78 y.o. female who presents for Medicare Annual/Subsequent preventive examination.  Preventive Screening-Counseling & Management  Tobacco History  Smoking status  . Never Smoker   Smokeless tobacco  . Not on file     Problems Prior to Visit 1.   Current Problems (verified) Patient Active Problem List   Diagnosis Date Noted  . BRADYCARDIA 01/07/2010  . SYNCOPE 01/07/2010  . AORTIC VALVE REPLACEMENT, HX OF 04/20/2009  . NEOPLASM, MALIGNANT, PAROTID 04/04/2009  . CHRONIC LYMPHADENITIS 05/10/2008  . TEMPOROMANDIBULAR JOINT DISORDER 10/25/2007  . HYPERTENSION, ESSENTIAL 10/11/2007  . UNS ADVRS EFF UNS RX MEDICINAL&BIOLOGICAL SBSTNC 10/11/2007  . HYPERLIPIDEMIA 08/03/2007  . OSTEOARTHRITIS 08/03/2007  . OSTEOPENIA 08/03/2007    Medications Prior to Visit Current Outpatient Prescriptions on File Prior to Visit  Medication Sig Dispense Refill  . aspirin 325 MG tablet Take 325 mg by mouth daily.        Marland Kitchen atorvastatin (LIPITOR) 20 MG tablet Take 1 tablet (20 mg total) by mouth daily.  30 tablet  11  . Cholecalciferol (VITAMIN D) 2000 UNITS CAPS Take 1 capsule (2,000 Units total) by mouth daily.  30 capsule    . co-enzyme Q-10 30 MG capsule Take 100 mg by mouth every other day.       . fish oil-omega-3 fatty acids 1000 MG capsule Take 2 g by mouth 2 (two) times daily.        . metoprolol tartrate (LOPRESSOR) 25 MG tablet 1.5 tab in am and 1 in pm  180 tablet  3  . sodium fluoride (SF 5000 PLUS) 1.1 % CREA dental cream Place 1 application onto teeth every evening.  57 g  11   No current facility-administered medications on file prior to visit.    Current Medications (verified) Current Outpatient  Prescriptions  Medication Sig Dispense Refill  . aspirin 325 MG tablet Take 325 mg by mouth daily.        Marland Kitchen atorvastatin (LIPITOR) 20 MG tablet Take 1 tablet (20 mg total) by mouth daily.  30 tablet  11  . Cholecalciferol (VITAMIN D) 2000 UNITS CAPS Take 1 capsule (2,000 Units total) by mouth daily.  30 capsule    . co-enzyme Q-10 30 MG capsule Take 100 mg by mouth every other day.       . fish oil-omega-3 fatty acids 1000 MG capsule Take 2 g by mouth 2 (two) times  daily.        . metoprolol tartrate (LOPRESSOR) 25 MG tablet 1.5 tab in am and 1 in pm  180 tablet  3  . sodium fluoride (SF 5000 PLUS) 1.1 % CREA dental cream Place 1 application onto teeth every evening.  57 g  11   No current facility-administered medications for this visit.     Allergies (verified) Review of patient's allergies indicates no known allergies.   PAST HISTORY  Family History Family History  Problem Relation Age of Onset  . Stroke Mother     Social History History  Substance Use Topics  . Smoking status: Never Smoker   . Smokeless tobacco: Not on file  . Alcohol Use: No     Are there smokers in your home (other than you)? No  Risk Factors Current exercise habits: The patient does not participate in regular exercise at present.  Dietary issues discussed: none   Cardiac risk factors: advanced age (older than 21 for men, 57 for women), dyslipidemia and hypertension.  Depression Screen (Note: if answer to either of the following is "Yes", a more complete depression screening is indicated)   Over the past two weeks, have you felt down, depressed or hopeless? No  Over the past two weeks, have you felt little interest or pleasure in doing things? No  Have you lost interest or pleasure in daily life? No  Do you often feel hopeless? No  Do you cry easily over simple problems? No  Activities of Daily Living In your present state of health, do you have any difficulty performing the following  activities?:  Driving? No Managing money?  No Feeding yourself? No Getting from bed to chair? No Climbing a flight of stairs? No Preparing food and eating?: No Bathing or showering? No Getting dressed: No Getting to the toilet? No Using the toilet:No Moving around from place to place: No In the past year have you fallen or had a near fall?:No   Are you sexually active?  Yes  Do you have more than one partner?  No  Hearing Difficulties: No Do you often ask people to speak up or repeat themselves? No Do you experience ringing or noises in your ears? No Do you have difficulty understanding soft or whispered voices? No   Do you feel that you have a problem with memory? No  Do you often misplace items? No  Do you feel safe at home?  Yes  Cognitive Testing  Alert? Yes  Normal Appearance?Yes  Oriented to person? Yes  Place? Yes   Time? Yes  Recall of three objects?  Yes  Can perform simple calculations? Yes  Displays appropriate judgment?Yes  Can read the correct time from a watch face?Yes   Advanced Directives have been discussed with the patient? Yes  List the Names of Other Physician/Practitioners you currently use: 1.    Indicate any recent Medical Services you may have received from other than [facility] providers in the past year (date may be approximate).  Immunization History  Administered Date(s) Administered  . Influenza Split 07/16/2011  . Influenza Whole 10/06/2001, 07/04/2009  . Influenza,inj,Quad PF,36+ Mos 09/21/2013  . Pneumococcal Polysaccharide-23 07/04/2009  . Td 10/06/2001, 09/21/2013  . Zoster 03/03/2007    Screening Tests Health Maintenance  Topic Date Due  . Influenza Vaccine  05/06/2014  . Colonoscopy  10/17/2015  . Tetanus/tdap  09/22/2023  . Pneumococcal Polysaccharide Vaccine Age 11 And Over  Completed  . Zostavax  Completed  All answers were reviewed with the patient and necessary referrals were made:  Carrie Mew,  MD   09/21/2013   History reviewed: allergies, current medications, past family history, past medical history, past social history, past surgical history and problem list  Review of Systems Pertinent items are noted in HPI.    Objective:     Vision by Snellen chart: right eye:20/20, left eye:20/20  Body mass index is 23.68 kg/(m^2). BP 130/80  Pulse 72  Temp(Src) 98 F (36.7 C)  Resp 16  Ht 5\' 4"  (1.626 m)  Wt 138 lb (62.596 kg)  BMI 23.68 kg/m2  Exam per problem focused     Assessment:      This is a routine physical examination for this healthy  Female. Reviewed all health maintenance protocols including mammography colonoscopy bone density and reviewed appropriate screening labs. Her immunization history was reviewed as well as her current medications and allergies refills of her chronic medications were given and the plan for yearly health maintenance was discussed all orders and referrals were made as appropriate.      Plan:     During the course of the visit the patient was educated and counseled about appropriate screening and preventive services including:    Pneumococcal vaccine   Influenza vaccine  Td vaccine  Diet review for nutrition referral? Yes ____  Not Indicated ____   Patient Instructions (the written plan) was given to the patient.  Medicare Attestation I have personally reviewed: The patient's medical and social history Their use of alcohol, tobacco or illicit drugs Their current medications and supplements The patient's functional ability including ADLs,fall risks, home safety risks, cognitive, and hearing and visual impairment Diet and physical activities Evidence for depression or mood disorders  The patient's weight, height, BMI, and visual acuity have been recorded in the chart.  I have made referrals, counseling, and provided education to the patient based on review of the above and I have provided the patient with a written  personalized care plan for preventive services.     Carrie Mew, MD   09/21/2013

## 2013-10-26 DIAGNOSIS — C089 Malignant neoplasm of major salivary gland, unspecified: Secondary | ICD-10-CM | POA: Diagnosis not present

## 2013-10-26 DIAGNOSIS — Z85819 Personal history of malignant neoplasm of unspecified site of lip, oral cavity, and pharynx: Secondary | ICD-10-CM | POA: Diagnosis not present

## 2013-10-26 DIAGNOSIS — R911 Solitary pulmonary nodule: Secondary | ICD-10-CM | POA: Diagnosis not present

## 2013-12-19 ENCOUNTER — Other Ambulatory Visit: Payer: Self-pay | Admitting: Internal Medicine

## 2014-02-03 ENCOUNTER — Other Ambulatory Visit: Payer: Self-pay

## 2014-02-03 DIAGNOSIS — Z1231 Encounter for screening mammogram for malignant neoplasm of breast: Secondary | ICD-10-CM

## 2014-03-03 DIAGNOSIS — L821 Other seborrheic keratosis: Secondary | ICD-10-CM | POA: Diagnosis not present

## 2014-03-03 DIAGNOSIS — L819 Disorder of pigmentation, unspecified: Secondary | ICD-10-CM | POA: Diagnosis not present

## 2014-03-03 DIAGNOSIS — Z85828 Personal history of other malignant neoplasm of skin: Secondary | ICD-10-CM | POA: Diagnosis not present

## 2014-03-03 DIAGNOSIS — L820 Inflamed seborrheic keratosis: Secondary | ICD-10-CM | POA: Diagnosis not present

## 2014-03-13 ENCOUNTER — Ambulatory Visit
Admission: RE | Admit: 2014-03-13 | Discharge: 2014-03-13 | Disposition: A | Discharge status: Home / Self Care | Payer: Medicare Other | Source: Ambulatory Visit

## 2014-03-13 ENCOUNTER — Ambulatory Visit: Payer: Medicare Other

## 2014-03-13 ENCOUNTER — Encounter (INDEPENDENT_AMBULATORY_CARE_PROVIDER_SITE_OTHER): Payer: Self-pay

## 2014-03-13 DIAGNOSIS — H43819 Vitreous degeneration, unspecified eye: Secondary | ICD-10-CM | POA: Diagnosis not present

## 2014-03-13 DIAGNOSIS — H04129 Dry eye syndrome of unspecified lacrimal gland: Secondary | ICD-10-CM | POA: Diagnosis not present

## 2014-03-13 DIAGNOSIS — Z961 Presence of intraocular lens: Secondary | ICD-10-CM | POA: Diagnosis not present

## 2014-03-13 DIAGNOSIS — Z1231 Encounter for screening mammogram for malignant neoplasm of breast: Secondary | ICD-10-CM | POA: Diagnosis not present

## 2014-03-13 DIAGNOSIS — H18519 Endothelial corneal dystrophy, unspecified eye: Secondary | ICD-10-CM | POA: Diagnosis not present

## 2014-03-15 ENCOUNTER — Telehealth: Payer: Self-pay | Admitting: Internal Medicine

## 2014-03-15 NOTE — Telephone Encounter (Signed)
Yes, please. Please schedule a 4min visit when possible.  Thanks.

## 2014-03-15 NOTE — Telephone Encounter (Signed)
Pt and husband have  been a long, long time Whitney Manning pt , and her and her husband both live near your office. They would like to know if you would please accept them as pts?

## 2014-03-16 NOTE — Telephone Encounter (Signed)
I spoke to patient and her husband and they scheduled appointments.

## 2014-03-22 ENCOUNTER — Ambulatory Visit: Payer: Medicare Other | Admitting: Internal Medicine

## 2014-03-23 ENCOUNTER — Other Ambulatory Visit: Payer: Self-pay | Admitting: Internal Medicine

## 2014-03-31 ENCOUNTER — Ambulatory Visit (INDEPENDENT_AMBULATORY_CARE_PROVIDER_SITE_OTHER): Payer: Medicare Other | Admitting: Family Medicine

## 2014-03-31 ENCOUNTER — Encounter: Payer: Self-pay | Admitting: Family Medicine

## 2014-03-31 VITALS — BP 142/70 | HR 65 | Temp 97.9°F | Ht 64.0 in | Wt 139.5 lb

## 2014-03-31 DIAGNOSIS — I10 Essential (primary) hypertension: Secondary | ICD-10-CM | POA: Diagnosis not present

## 2014-03-31 DIAGNOSIS — C07 Malignant neoplasm of parotid gland: Secondary | ICD-10-CM | POA: Diagnosis not present

## 2014-03-31 DIAGNOSIS — R911 Solitary pulmonary nodule: Secondary | ICD-10-CM

## 2014-03-31 MED ORDER — METOPROLOL TARTRATE 25 MG PO TABS: ORAL_TABLET | ORAL | Status: DC

## 2014-03-31 MED ORDER — ATORVASTATIN CALCIUM 20 MG PO TABS: 20.0000 mg | ORAL_TABLET | ORAL | Status: DC

## 2014-03-31 NOTE — Patient Instructions (Signed)
Take care.  Glad to see you.  Recheck at a physical in about 1 year.   When you need refills, then have the pharmacy contact us here.  Take care.

## 2014-03-31 NOTE — Progress Notes (Signed)
Pre visit review using our clinic review tool, if applicable. No additional management support is needed unless otherwise documented below in the visit note.  H/o L parotid tumor, [facility] notes reviewed via care everywhere, s/p resection with resulting CN 5 and 7 changes on the L side of face.  Some pocketing of food on the L side of the mouth, but no dysphagia or dysphonia.    Hypertension:    Using medication without problems or lightheadedness: yes Chest pain with exertion:no Edema:no Short of breath:no S/p valve replacement with f/u at [facility].   H/o pulmonary nodule.  Chest CT (10/26/12) shows unchanged groundglass nodule in the right upper lobe, and equivocal minimal interval increase in size in tiny right upper lobe lung nodule. Similar appearance of pleural nodularity in the left hemithorax, likely related to pleurodesis.   Meds, vitals, and allergies reviewed.   PMH and SH reviewed  ROS: See HPI.  Otherwise negative.    GEN: nad, alert and oriented HEENT: mucous membranes moist, TM wnl, OP wnl but dec in sensation on L V3 and weakness on L lower VII NECK: supple w/o LA CV: rrr. PULM: ctab, no inc wob ABD: soft, +bs EXT: no edema SKIN: no acute rash

## 2014-04-02 ENCOUNTER — Encounter: Payer: Self-pay | Admitting: Family Medicine

## 2014-04-02 DIAGNOSIS — C3490 Malignant neoplasm of unspecified part of unspecified bronchus or lung: Secondary | ICD-10-CM | POA: Insufficient documentation

## 2014-04-02 DIAGNOSIS — Z85118 Personal history of other malignant neoplasm of bronchus and lung: Secondary | ICD-10-CM | POA: Insufficient documentation

## 2014-04-02 NOTE — Assessment & Plan Note (Signed)
Reasonable control, continue as is.  Prev labs reviewed from prior PCP. Lipids and Cr okay.

## 2014-04-02 NOTE — Assessment & Plan Note (Signed)
Chest CT (10/26/12) shows unchanged groundglass nodule in the right upper lobe, and equivocal minimal interval increase in size in tiny right upper lobe lung nodule. Similar appearance of pleural nodularity in the left hemithorax, likely related to pleurodesis.  Per XQK.

## 2014-04-02 NOTE — Assessment & Plan Note (Signed)
Per KAJ.  D/w pt about chewing and her V and VII nerve changes.

## 2014-04-26 DIAGNOSIS — C089 Malignant neoplasm of major salivary gland, unspecified: Secondary | ICD-10-CM | POA: Diagnosis not present

## 2014-05-24 DIAGNOSIS — I359 Nonrheumatic aortic valve disorder, unspecified: Secondary | ICD-10-CM | POA: Diagnosis not present

## 2014-05-24 DIAGNOSIS — Z8679 Personal history of other diseases of the circulatory system: Secondary | ICD-10-CM | POA: Diagnosis not present

## 2014-05-24 DIAGNOSIS — Z954 Presence of other heart-valve replacement: Secondary | ICD-10-CM | POA: Diagnosis not present

## 2014-09-27 DIAGNOSIS — C089 Malignant neoplasm of major salivary gland, unspecified: Secondary | ICD-10-CM | POA: Diagnosis not present

## 2014-10-06 DIAGNOSIS — C3490 Malignant neoplasm of unspecified part of unspecified bronchus or lung: Secondary | ICD-10-CM

## 2014-10-06 HISTORY — PX: LUNG LOBECTOMY: SHX167

## 2014-10-06 HISTORY — DX: Malignant neoplasm of unspecified part of unspecified bronchus or lung: C34.90

## 2014-10-23 ENCOUNTER — Other Ambulatory Visit: Payer: Self-pay | Admitting: *Deleted

## 2014-10-23 NOTE — Telephone Encounter (Signed)
Faxed refill request.  Last Filled:    57 g 11 RF on 09/12/2013  Please advise.

## 2014-10-24 MED ORDER — SODIUM FLUORIDE 1.1 % DT CREA: 1.0000 "application " | TOPICAL_CREAM | Freq: Every evening | DENTAL | Status: DC

## 2014-10-24 NOTE — Telephone Encounter (Signed)
Sent. Thanks.   

## 2014-11-01 DIAGNOSIS — C089 Malignant neoplasm of major salivary gland, unspecified: Secondary | ICD-10-CM | POA: Diagnosis not present

## 2014-11-24 DIAGNOSIS — Z85818 Personal history of malignant neoplasm of other sites of lip, oral cavity, and pharynx: Secondary | ICD-10-CM | POA: Diagnosis not present

## 2014-11-24 DIAGNOSIS — E785 Hyperlipidemia, unspecified: Secondary | ICD-10-CM | POA: Diagnosis not present

## 2014-11-24 DIAGNOSIS — Z7982 Long term (current) use of aspirin: Secondary | ICD-10-CM | POA: Diagnosis not present

## 2014-11-24 DIAGNOSIS — R0602 Shortness of breath: Secondary | ICD-10-CM | POA: Diagnosis not present

## 2014-11-24 DIAGNOSIS — M810 Age-related osteoporosis without current pathological fracture: Secondary | ICD-10-CM | POA: Diagnosis not present

## 2014-11-24 DIAGNOSIS — R911 Solitary pulmonary nodule: Secondary | ICD-10-CM | POA: Diagnosis not present

## 2014-11-24 DIAGNOSIS — I2510 Atherosclerotic heart disease of native coronary artery without angina pectoris: Secondary | ICD-10-CM | POA: Diagnosis not present

## 2014-11-24 DIAGNOSIS — R918 Other nonspecific abnormal finding of lung field: Secondary | ICD-10-CM | POA: Diagnosis not present

## 2014-11-24 DIAGNOSIS — Z923 Personal history of irradiation: Secondary | ICD-10-CM | POA: Diagnosis not present

## 2014-11-24 DIAGNOSIS — I4891 Unspecified atrial fibrillation: Secondary | ICD-10-CM | POA: Diagnosis not present

## 2014-12-13 DIAGNOSIS — I2510 Atherosclerotic heart disease of native coronary artery without angina pectoris: Secondary | ICD-10-CM | POA: Insufficient documentation

## 2014-12-13 DIAGNOSIS — R918 Other nonspecific abnormal finding of lung field: Secondary | ICD-10-CM | POA: Diagnosis not present

## 2014-12-13 DIAGNOSIS — R791 Abnormal coagulation profile: Secondary | ICD-10-CM | POA: Diagnosis not present

## 2014-12-13 DIAGNOSIS — R911 Solitary pulmonary nodule: Secondary | ICD-10-CM | POA: Diagnosis not present

## 2015-01-15 DIAGNOSIS — Z01818 Encounter for other preprocedural examination: Secondary | ICD-10-CM | POA: Diagnosis not present

## 2015-01-15 DIAGNOSIS — Z8589 Personal history of malignant neoplasm of other organs and systems: Secondary | ICD-10-CM | POA: Diagnosis not present

## 2015-01-15 DIAGNOSIS — Z953 Presence of xenogenic heart valve: Secondary | ICD-10-CM | POA: Diagnosis not present

## 2015-01-15 DIAGNOSIS — Z85858 Personal history of malignant neoplasm of other endocrine glands: Secondary | ICD-10-CM | POA: Diagnosis not present

## 2015-01-15 DIAGNOSIS — I350 Nonrheumatic aortic (valve) stenosis: Secondary | ICD-10-CM | POA: Diagnosis not present

## 2015-01-15 DIAGNOSIS — Z952 Presence of prosthetic heart valve: Secondary | ICD-10-CM | POA: Diagnosis not present

## 2015-01-15 DIAGNOSIS — R918 Other nonspecific abnormal finding of lung field: Secondary | ICD-10-CM | POA: Diagnosis not present

## 2015-01-25 DIAGNOSIS — J9811 Atelectasis: Secondary | ICD-10-CM | POA: Diagnosis not present

## 2015-01-25 DIAGNOSIS — I2510 Atherosclerotic heart disease of native coronary artery without angina pectoris: Secondary | ICD-10-CM | POA: Diagnosis present

## 2015-01-25 DIAGNOSIS — E785 Hyperlipidemia, unspecified: Secondary | ICD-10-CM | POA: Diagnosis present

## 2015-01-25 DIAGNOSIS — Z87442 Personal history of urinary calculi: Secondary | ICD-10-CM | POA: Diagnosis not present

## 2015-01-25 DIAGNOSIS — C7801 Secondary malignant neoplasm of right lung: Secondary | ICD-10-CM | POA: Diagnosis present

## 2015-01-25 DIAGNOSIS — Z85818 Personal history of malignant neoplasm of other sites of lip, oral cavity, and pharynx: Secondary | ICD-10-CM | POA: Diagnosis not present

## 2015-01-25 DIAGNOSIS — J90 Pleural effusion, not elsewhere classified: Secondary | ICD-10-CM | POA: Diagnosis not present

## 2015-01-25 DIAGNOSIS — R911 Solitary pulmonary nodule: Secondary | ICD-10-CM | POA: Diagnosis not present

## 2015-01-25 DIAGNOSIS — Z951 Presence of aortocoronary bypass graft: Secondary | ICD-10-CM | POA: Diagnosis not present

## 2015-01-25 DIAGNOSIS — C3411 Malignant neoplasm of upper lobe, right bronchus or lung: Secondary | ICD-10-CM | POA: Diagnosis not present

## 2015-01-25 DIAGNOSIS — Z953 Presence of xenogenic heart valve: Secondary | ICD-10-CM | POA: Diagnosis not present

## 2015-01-25 DIAGNOSIS — I350 Nonrheumatic aortic (valve) stenosis: Secondary | ICD-10-CM | POA: Diagnosis present

## 2015-01-25 DIAGNOSIS — R918 Other nonspecific abnormal finding of lung field: Secondary | ICD-10-CM | POA: Diagnosis not present

## 2015-01-25 DIAGNOSIS — Z952 Presence of prosthetic heart valve: Secondary | ICD-10-CM | POA: Diagnosis not present

## 2015-01-25 DIAGNOSIS — I4891 Unspecified atrial fibrillation: Secondary | ICD-10-CM | POA: Diagnosis present

## 2015-01-25 DIAGNOSIS — Z923 Personal history of irradiation: Secondary | ICD-10-CM | POA: Diagnosis not present

## 2015-01-25 DIAGNOSIS — M810 Age-related osteoporosis without current pathological fracture: Secondary | ICD-10-CM | POA: Diagnosis present

## 2015-01-26 DIAGNOSIS — R918 Other nonspecific abnormal finding of lung field: Secondary | ICD-10-CM | POA: Insufficient documentation

## 2015-01-28 ENCOUNTER — Encounter: Payer: Self-pay | Admitting: Family Medicine

## 2015-02-19 ENCOUNTER — Encounter: Payer: Self-pay | Admitting: Family Medicine

## 2015-02-19 DIAGNOSIS — C089 Malignant neoplasm of major salivary gland, unspecified: Secondary | ICD-10-CM | POA: Insufficient documentation

## 2015-02-19 DIAGNOSIS — Z85819 Personal history of malignant neoplasm of unspecified site of lip, oral cavity, and pharynx: Secondary | ICD-10-CM | POA: Insufficient documentation

## 2015-02-19 DIAGNOSIS — Z8509 Personal history of malignant neoplasm of other digestive organs: Secondary | ICD-10-CM | POA: Insufficient documentation

## 2015-02-20 ENCOUNTER — Other Ambulatory Visit: Payer: Self-pay

## 2015-02-20 DIAGNOSIS — Z1231 Encounter for screening mammogram for malignant neoplasm of breast: Secondary | ICD-10-CM

## 2015-02-20 DIAGNOSIS — C3411 Malignant neoplasm of upper lobe, right bronchus or lung: Secondary | ICD-10-CM | POA: Diagnosis not present

## 2015-02-23 DIAGNOSIS — R911 Solitary pulmonary nodule: Secondary | ICD-10-CM | POA: Diagnosis not present

## 2015-02-23 DIAGNOSIS — Z902 Acquired absence of lung [part of]: Secondary | ICD-10-CM | POA: Diagnosis not present

## 2015-02-23 DIAGNOSIS — Z85818 Personal history of malignant neoplasm of other sites of lip, oral cavity, and pharynx: Secondary | ICD-10-CM | POA: Diagnosis not present

## 2015-02-23 DIAGNOSIS — Z7982 Long term (current) use of aspirin: Secondary | ICD-10-CM | POA: Diagnosis not present

## 2015-02-23 DIAGNOSIS — C3411 Malignant neoplasm of upper lobe, right bronchus or lung: Secondary | ICD-10-CM | POA: Diagnosis not present

## 2015-02-23 DIAGNOSIS — C7801 Secondary malignant neoplasm of right lung: Secondary | ICD-10-CM | POA: Diagnosis not present

## 2015-03-15 DIAGNOSIS — Z961 Presence of intraocular lens: Secondary | ICD-10-CM | POA: Diagnosis not present

## 2015-03-15 DIAGNOSIS — H16103 Unspecified superficial keratitis, bilateral: Secondary | ICD-10-CM | POA: Diagnosis not present

## 2015-03-15 DIAGNOSIS — H43813 Vitreous degeneration, bilateral: Secondary | ICD-10-CM | POA: Diagnosis not present

## 2015-03-15 DIAGNOSIS — H04123 Dry eye syndrome of bilateral lacrimal glands: Secondary | ICD-10-CM | POA: Diagnosis not present

## 2015-03-22 ENCOUNTER — Encounter: Payer: Self-pay | Admitting: *Deleted

## 2015-03-22 ENCOUNTER — Ambulatory Visit
Admission: RE | Admit: 2015-03-22 | Discharge: 2015-03-22 | Disposition: A | Discharge status: Home / Self Care | Payer: Medicare Other | Source: Ambulatory Visit

## 2015-03-22 DIAGNOSIS — Z1231 Encounter for screening mammogram for malignant neoplasm of breast: Secondary | ICD-10-CM

## 2015-03-23 ENCOUNTER — Telehealth (HOSPITAL_COMMUNITY): Payer: Self-pay

## 2015-03-23 NOTE — Telephone Encounter (Signed)
I have called and left a message with Whitney Manning to inquire about participation in Pulmonary Rehab per Dr. Lissa Hoard referral. Requested patient call back to discuss program.

## 2015-03-26 ENCOUNTER — Telehealth: Payer: Self-pay | Admitting: Family Medicine

## 2015-03-26 NOTE — Telephone Encounter (Signed)
Social call.  Called and talked to patient about recent surgery.   She'll notify me if I can be of service.  She thanked me for the call.

## 2015-04-02 DIAGNOSIS — C089 Malignant neoplasm of major salivary gland, unspecified: Secondary | ICD-10-CM | POA: Diagnosis not present

## 2015-04-02 DIAGNOSIS — C3490 Malignant neoplasm of unspecified part of unspecified bronchus or lung: Secondary | ICD-10-CM | POA: Insufficient documentation

## 2015-04-02 DIAGNOSIS — Z85818 Personal history of malignant neoplasm of other sites of lip, oral cavity, and pharynx: Secondary | ICD-10-CM | POA: Diagnosis not present

## 2015-04-03 ENCOUNTER — Encounter: Payer: Self-pay | Admitting: Family Medicine

## 2015-04-03 ENCOUNTER — Ambulatory Visit (INDEPENDENT_AMBULATORY_CARE_PROVIDER_SITE_OTHER): Payer: Medicare Other | Admitting: Family Medicine

## 2015-04-03 VITALS — BP 114/60 | HR 55 | Temp 97.6°F | Ht 64.0 in | Wt 134.8 lb

## 2015-04-03 DIAGNOSIS — Z23 Encounter for immunization: Secondary | ICD-10-CM

## 2015-04-03 DIAGNOSIS — Z131 Encounter for screening for diabetes mellitus: Secondary | ICD-10-CM | POA: Diagnosis not present

## 2015-04-03 DIAGNOSIS — G4700 Insomnia, unspecified: Secondary | ICD-10-CM

## 2015-04-03 DIAGNOSIS — I10 Essential (primary) hypertension: Secondary | ICD-10-CM

## 2015-04-03 DIAGNOSIS — E2839 Other primary ovarian failure: Secondary | ICD-10-CM

## 2015-04-03 DIAGNOSIS — E785 Hyperlipidemia, unspecified: Secondary | ICD-10-CM | POA: Diagnosis not present

## 2015-04-03 DIAGNOSIS — C089 Malignant neoplasm of major salivary gland, unspecified: Secondary | ICD-10-CM

## 2015-04-03 DIAGNOSIS — Z7189 Other specified counseling: Secondary | ICD-10-CM

## 2015-04-03 DIAGNOSIS — Z0000 Encounter for general adult medical examination without abnormal findings: Secondary | ICD-10-CM

## 2015-04-03 DIAGNOSIS — C3490 Malignant neoplasm of unspecified part of unspecified bronchus or lung: Secondary | ICD-10-CM

## 2015-04-03 LAB — GLUCOSE, RANDOM: Glucose, Bld: 94 mg/dL (ref 70–99)

## 2015-04-03 LAB — LIPID PANEL
Cholesterol: 184 mg/dL (ref 0–200)
HDL: 58 mg/dL (ref >39.00)
LDL Cholesterol: 110 mg/dL — ABNORMAL HIGH (ref 0–99)
NonHDL: 126
TRIGLYCERIDES: 78 mg/dL (ref 0.0–149.0)
Total CHOL/HDL Ratio: 3
VLDL: 15.6 mg/dL (ref 0.0–40.0)

## 2015-04-03 MED ORDER — ATORVASTATIN CALCIUM 20 MG PO TABS: 20.0000 mg | ORAL_TABLET | ORAL | Status: DC

## 2015-04-03 MED ORDER — METOPROLOL TARTRATE 25 MG PO TABS: ORAL_TABLET | ORAL | Status: DC

## 2015-04-03 NOTE — Progress Notes (Signed)
Pre visit review using our clinic review tool, if applicable. No additional management support is needed unless otherwise documented below in the visit note.  I have personally reviewed the Medicare Annual Wellness questionnaire and have noted 1. The patient's medical and social history 2. Their use of alcohol, tobacco or illicit drugs 3. Their current medications and supplements 4. The patient's functional ability including ADL's, fall risks, home safety risks and hearing or visual             impairment. 5. Diet and physical activities 6. Evidence for depression or mood disorders  The patients weight, height, BMI have been recorded in the chart and visual acuity is per eye clinic.  I have made referrals, counseling and provided education to the patient based review of the above and I have provided the pt with a written personalized care plan for preventive services.  Provider list updated- see scanned forms.  Routine anticipatory guidance given to patient.  See health maintenance.  Flu encouraged Shingles 2008 PNA d/w pt.  Tetanus 2014 Colonoscopy 2007 Breast cancer screening- mammogram 2016 DXA pending.  Advance directive- husband designated if patient were incapacitated.   Cognitive function addressed- see scanned forms- and if abnormal then additional documentation follows.   Hypertension:    Using medication without problems or lightheadedness: yes, except for rarely lightheaded, not bothersome.   Chest pain with exertion:no Edema:no Short of breath:no See notes on f/u labs.   Elevated Cholesterol: Using medications without problems:yes Muscle aches: no Diet compliance:yes Exercise: as tolerated.   Some insomnia noted, night waking and trouble getting back to sleep.  D/w pt.  Limited caffeine already.  Reasonable to try melatonin.  D/w pt.   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 She was found to have two right upper lobe lung nodules and on  01/25/15 underwent a robotic assisted right upper lobectomy. 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  She went back yesterday and will have f/u in 05/2015.  She has no plans or need for tx at this point- only observation.   PMH and SH reviewed  Meds, vitals, and allergies reviewed.   ROS: See HPI.  Otherwise negative.    GEN: nad, alert and oriented HEENT: mucous membranes moist, chronic post surgical changes noted.   NECK: supple w/o LA CV: rrr. PULM: ctab, no inc wob, chest wall scar well healed.  ABD: soft, +bs EXT: no edema SKIN: no acute rash

## 2015-04-03 NOTE — Patient Instructions (Addendum)
Rosaria Ferries will call about your referral. Go to the lab on the way out.  We'll contact you with your lab report. Take care.  Glad to see you.  Try melatonin and see if that helps with insomnia.

## 2015-04-04 DIAGNOSIS — Z7189 Other specified counseling: Secondary | ICD-10-CM | POA: Insufficient documentation

## 2015-04-04 DIAGNOSIS — Z0000 Encounter for general adult medical examination without abnormal findings: Secondary | ICD-10-CM | POA: Insufficient documentation

## 2015-04-04 DIAGNOSIS — G4700 Insomnia, unspecified: Secondary | ICD-10-CM | POA: Insufficient documentation

## 2015-04-04 NOTE — Assessment & Plan Note (Signed)
Advance directive- husband designated if patient were incapacitated.  

## 2015-04-04 NOTE — Assessment & Plan Note (Signed)
Controlled, rarely lightheaded, continue as is.  See notes on labs.

## 2015-04-04 NOTE — Assessment & Plan Note (Signed)
Will try melatonin and see if that helps with insomnia.  She'll update me as needed.  Predates the cancer dx/treatment.

## 2015-04-04 NOTE — Assessment & Plan Note (Signed)
Per [facility].  App help from [facility].

## 2015-04-04 NOTE — Assessment & Plan Note (Signed)
Reasonable control, see notes on labs.  Continue as is.

## 2015-04-04 NOTE — Assessment & Plan Note (Signed)
On observation per [facility].

## 2015-04-04 NOTE — Assessment & Plan Note (Signed)
Flu encouraged Shingles 2008 PNA d/w pt.  Tetanus 2014 Colonoscopy 2007 Breast cancer screening- mammogram 2016 DXA pending.  Advance directive- husband designated if patient were incapacitated.   Cognitive function addressed- see scanned forms- and if abnormal then additional documentation follows.

## 2015-04-11 ENCOUNTER — Other Ambulatory Visit: Payer: Medicare Other

## 2015-04-16 ENCOUNTER — Telehealth (HOSPITAL_COMMUNITY): Payer: Self-pay

## 2015-04-16 NOTE — Telephone Encounter (Signed)
I have called and left a message with Whitney Manning to inquire about participation in Pulmonary Rehab per Dr. Lissa Hoard referral. [facility]

## 2015-04-17 ENCOUNTER — Ambulatory Visit
Admission: RE | Admit: 2015-04-17 | Discharge: 2015-04-17 | Disposition: A | Discharge status: Home / Self Care | Payer: Medicare Other | Source: Ambulatory Visit | Attending: Family Medicine | Admitting: Family Medicine

## 2015-04-17 DIAGNOSIS — E2839 Other primary ovarian failure: Secondary | ICD-10-CM

## 2015-04-17 DIAGNOSIS — M85832 Other specified disorders of bone density and structure, left forearm: Secondary | ICD-10-CM | POA: Diagnosis not present

## 2015-04-17 DIAGNOSIS — M85852 Other specified disorders of bone density and structure, left thigh: Secondary | ICD-10-CM | POA: Diagnosis not present

## 2015-04-22 DIAGNOSIS — C059 Malignant neoplasm of palate, unspecified: Secondary | ICD-10-CM | POA: Diagnosis not present

## 2015-04-27 ENCOUNTER — Encounter: Payer: Self-pay | Admitting: Family Medicine

## 2015-04-27 ENCOUNTER — Ambulatory Visit (INDEPENDENT_AMBULATORY_CARE_PROVIDER_SITE_OTHER): Payer: Medicare Other | Admitting: Family Medicine

## 2015-04-27 VITALS — BP 114/68 | HR 97 | Temp 98.0°F | Wt 135.5 lb

## 2015-04-27 DIAGNOSIS — M8580 Other specified disorders of bone density and structure, unspecified site: Secondary | ICD-10-CM

## 2015-04-27 NOTE — Patient Instructions (Signed)
You have osteopenia- some thinning of the bones.  The next option for treatment would likely be prolia.   I would consider that and let me know what you think.  Take care.  Glad to see you.

## 2015-04-27 NOTE — Progress Notes (Signed)
Pre visit review using our clinic review tool, if applicable. No additional management support is needed unless otherwise documented below in the visit note.  DXA f/u.  Couldn't take fosamax prev.  D/w pt about DXA and options.  Prolia is likely the best option.  See plan.   Meds, vitals, and allergies reviewed.   ROS: See HPI.  Otherwise, noncontributory.  nad

## 2015-04-29 NOTE — Assessment & Plan Note (Signed)
>  15 minutes spent in face to face time with patient, >50% spent in counselling or coordination of care  Couldn't take fosamax prev.  D/w pt about DXA and options.  Prolia is likely the best option, given her FRAX scoring.  Risk of ADE, including possible long bone fxs d/w pt.   Would continue vitamin D for now.   Should she want to pursue treatment, she'll update me.  We can work on any needed pre-treatment labs, PA for the medicine at that point.  She'll consider and update me.  No decision at time of exam.

## 2015-05-02 DIAGNOSIS — L820 Inflamed seborrheic keratosis: Secondary | ICD-10-CM | POA: Diagnosis not present

## 2015-05-02 DIAGNOSIS — L821 Other seborrheic keratosis: Secondary | ICD-10-CM | POA: Diagnosis not present

## 2015-05-02 DIAGNOSIS — I788 Other diseases of capillaries: Secondary | ICD-10-CM | POA: Diagnosis not present

## 2015-05-02 DIAGNOSIS — Z85828 Personal history of other malignant neoplasm of skin: Secondary | ICD-10-CM | POA: Diagnosis not present

## 2015-05-02 DIAGNOSIS — L814 Other melanin hyperpigmentation: Secondary | ICD-10-CM | POA: Diagnosis not present

## 2015-05-02 DIAGNOSIS — D1801 Hemangioma of skin and subcutaneous tissue: Secondary | ICD-10-CM | POA: Diagnosis not present

## 2015-05-04 DIAGNOSIS — H0012 Chalazion right lower eyelid: Secondary | ICD-10-CM | POA: Diagnosis not present

## 2015-06-01 DIAGNOSIS — C7989 Secondary malignant neoplasm of other specified sites: Secondary | ICD-10-CM | POA: Diagnosis not present

## 2015-06-01 DIAGNOSIS — Z902 Acquired absence of lung [part of]: Secondary | ICD-10-CM | POA: Diagnosis not present

## 2015-06-01 DIAGNOSIS — C3411 Malignant neoplasm of upper lobe, right bronchus or lung: Secondary | ICD-10-CM | POA: Diagnosis not present

## 2015-06-01 DIAGNOSIS — C089 Malignant neoplasm of major salivary gland, unspecified: Secondary | ICD-10-CM | POA: Diagnosis not present

## 2015-06-01 DIAGNOSIS — Z7982 Long term (current) use of aspirin: Secondary | ICD-10-CM | POA: Diagnosis not present

## 2015-06-01 DIAGNOSIS — C3491 Malignant neoplasm of unspecified part of right bronchus or lung: Secondary | ICD-10-CM | POA: Diagnosis not present

## 2015-07-04 DIAGNOSIS — I447 Left bundle-branch block, unspecified: Secondary | ICD-10-CM | POA: Diagnosis not present

## 2015-07-04 DIAGNOSIS — Z7982 Long term (current) use of aspirin: Secondary | ICD-10-CM | POA: Diagnosis not present

## 2015-07-04 DIAGNOSIS — I208 Other forms of angina pectoris: Secondary | ICD-10-CM | POA: Diagnosis not present

## 2015-07-04 DIAGNOSIS — Z79899 Other long term (current) drug therapy: Secondary | ICD-10-CM | POA: Diagnosis not present

## 2015-07-04 DIAGNOSIS — I350 Nonrheumatic aortic (valve) stenosis: Secondary | ICD-10-CM | POA: Diagnosis not present

## 2015-07-04 DIAGNOSIS — R001 Bradycardia, unspecified: Secondary | ICD-10-CM | POA: Diagnosis not present

## 2015-07-04 DIAGNOSIS — I498 Other specified cardiac arrhythmias: Secondary | ICD-10-CM | POA: Diagnosis not present

## 2015-08-03 ENCOUNTER — Ambulatory Visit (INDEPENDENT_AMBULATORY_CARE_PROVIDER_SITE_OTHER): Payer: Medicare Other

## 2015-08-03 DIAGNOSIS — Z23 Encounter for immunization: Secondary | ICD-10-CM | POA: Diagnosis not present

## 2016-02-11 ENCOUNTER — Encounter: Payer: Self-pay | Admitting: Gastroenterology

## 2016-02-25 ENCOUNTER — Other Ambulatory Visit: Payer: Self-pay

## 2016-02-25 DIAGNOSIS — Z1231 Encounter for screening mammogram for malignant neoplasm of breast: Secondary | ICD-10-CM

## 2016-03-24 ENCOUNTER — Ambulatory Visit
Admission: RE | Admit: 2016-03-24 | Discharge: 2016-03-24 | Disposition: A | Discharge status: Home / Self Care | Payer: Medicare Other | Source: Ambulatory Visit

## 2016-03-24 DIAGNOSIS — Z1231 Encounter for screening mammogram for malignant neoplasm of breast: Secondary | ICD-10-CM

## 2016-03-25 ENCOUNTER — Encounter: Payer: Self-pay | Admitting: *Deleted

## 2016-04-04 ENCOUNTER — Encounter: Payer: Self-pay | Admitting: Family Medicine

## 2016-04-04 ENCOUNTER — Ambulatory Visit (INDEPENDENT_AMBULATORY_CARE_PROVIDER_SITE_OTHER): Payer: Medicare Other | Admitting: Family Medicine

## 2016-04-04 VITALS — BP 102/66 | HR 66 | Temp 97.9°F | Ht 64.0 in | Wt 134.8 lb

## 2016-04-04 DIAGNOSIS — Z7189 Other specified counseling: Secondary | ICD-10-CM

## 2016-04-04 DIAGNOSIS — C089 Malignant neoplasm of major salivary gland, unspecified: Secondary | ICD-10-CM

## 2016-04-04 DIAGNOSIS — Z0000 Encounter for general adult medical examination without abnormal findings: Secondary | ICD-10-CM | POA: Diagnosis not present

## 2016-04-04 DIAGNOSIS — Z952 Presence of prosthetic heart valve: Secondary | ICD-10-CM

## 2016-04-04 DIAGNOSIS — E785 Hyperlipidemia, unspecified: Secondary | ICD-10-CM | POA: Diagnosis not present

## 2016-04-04 DIAGNOSIS — I10 Essential (primary) hypertension: Secondary | ICD-10-CM

## 2016-04-04 DIAGNOSIS — M8580 Other specified disorders of bone density and structure, unspecified site: Secondary | ICD-10-CM

## 2016-04-04 DIAGNOSIS — C3490 Malignant neoplasm of unspecified part of unspecified bronchus or lung: Secondary | ICD-10-CM

## 2016-04-04 MED ORDER — METOPROLOL TARTRATE 25 MG PO TABS: ORAL_TABLET | ORAL | Status: DC

## 2016-04-04 MED ORDER — ATORVASTATIN CALCIUM 20 MG PO TABS: 20.0000 mg | ORAL_TABLET | ORAL | Status: DC

## 2016-04-04 NOTE — Patient Instructions (Signed)
Fasting labs when possible.  Don't change your meds for now.  Take care.  Glad to see you.  Update me as needed.

## 2016-04-04 NOTE — Progress Notes (Signed)
Pre visit review using our clinic review tool, if applicable. No additional management support is needed unless otherwise documented below in the visit note.  I have personally reviewed the Medicare Annual Wellness questionnaire and have noted 1. The patient's medical and social history 2. Their use of alcohol, tobacco or illicit drugs 3. Their current medications and supplements 4. The patient's functional ability including ADL's, fall risks, home safety risks and hearing or visual             impairment. 5. Diet and physical activities 6. Evidence for depression or mood disorders  The patients weight, height, BMI have been recorded in the chart and visual acuity is per eye clinic.  I have made referrals, counseling and provided education to the patient based review of the above and I have provided the pt with a written personalized care plan for preventive services.  Provider list updated- see scanned forms.  Routine anticipatory guidance given to patient.  See health maintenance.  Flu 2016 Shingles 2008 PNA up to date Tetanus 2014 Colonoscopy 2007.  She'll consider f/u in 2018, d/w pt.  Breast cancer screening 2017 Advance directive - husband designated if patient were incapacitated.  DXA 2016, declined tx now, d/w pt.  No need f/u scan at this point.  Cognitive function addressed- see scanned forms- and if abnormal then additional documentation follows.  Labs pending.   She is on observation per [facility] for salivary and lung CA.  She still has some numbness and neuropathic sx on the L side of her face after prev treatment, as likely expected.   Hypertension:    Using medication without problems or lightheadedness: yes Chest pain with exertion:no Edema:no Short of breath:no  Elevated Cholesterol: Using medications without problems:yes Muscle aches: no Diet compliance:yes Exercise:yes Labs pending.    PMH and SH reviewed  Meds, vitals, and allergies reviewed.   ROS: Per  HPI.  Unless specifically indicated otherwise in HPI, the patient denies:  General: fever. Eyes: acute vision changes ENT: sore throat Cardiovascular: chest pain Respiratory: SOB GI: vomiting GU: dysuria Musculoskeletal: acute back pain Derm: acute rash Neuro: acute motor dysfunction Psych: worsening mood Endocrine: polydipsia Heme: bleeding Allergy: hayfever  GEN: nad, alert and oriented HEENT: mucous membranes moist NECK: supple w/o LA CV: rrr. PULM: ctab, no inc wob ABD: soft, +bs EXT: no edema SKIN: no acute rash

## 2016-04-06 NOTE — Assessment & Plan Note (Signed)
Controlled, continue as is.  D/w pt.  Return for labs.

## 2016-04-06 NOTE — Assessment & Plan Note (Signed)
Will continue as is.  D/w pt.  Return for labs.

## 2016-04-06 NOTE — Assessment & Plan Note (Signed)
Per [employer] cards

## 2016-04-06 NOTE — Assessment & Plan Note (Signed)
Flu 2016 Shingles 2008 PNA up to date Tetanus 2014 Colonoscopy 2007.  She'll consider f/u in 2018, d/w pt.  Breast cancer screening 2017 Advance directive - husband designated if patient were incapacitated.  DXA 2016, declined tx now, d/w pt.  No need f/u scan at this point.  Cognitive function addressed- see scanned forms- and if abnormal then additional documentation follows.  Labs pending.

## 2016-04-06 NOTE — Assessment & Plan Note (Signed)
Per [employer]

## 2016-04-06 NOTE — Assessment & Plan Note (Signed)
Advance directive- husband designated if patient were incapacitated.  

## 2016-04-07 ENCOUNTER — Other Ambulatory Visit (INDEPENDENT_AMBULATORY_CARE_PROVIDER_SITE_OTHER): Payer: Medicare Other

## 2016-04-07 DIAGNOSIS — E785 Hyperlipidemia, unspecified: Secondary | ICD-10-CM | POA: Diagnosis not present

## 2016-04-07 DIAGNOSIS — M8580 Other specified disorders of bone density and structure, unspecified site: Secondary | ICD-10-CM

## 2016-04-07 LAB — COMPREHENSIVE METABOLIC PANEL
ALBUMIN: 4.2 g/dL (ref 3.5–5.2)
ALT: 14 U/L (ref 0–35)
AST: 21 U/L (ref 0–37)
Alkaline Phosphatase: 64 U/L (ref 39–117)
BUN: 20 mg/dL (ref 6–23)
CALCIUM: 9.3 mg/dL (ref 8.4–10.5)
CHLORIDE: 108 meq/L (ref 96–112)
CO2: 28 mEq/L (ref 19–32)
Creatinine, Ser: 0.88 mg/dL (ref 0.40–1.20)
GFR: 65.94 mL/min (ref >60.00)
Glucose, Bld: 89 mg/dL (ref 70–99)
POTASSIUM: 4 meq/L (ref 3.5–5.1)
SODIUM: 139 meq/L (ref 135–145)
Total Bilirubin: 0.7 mg/dL (ref 0.2–1.2)
Total Protein: 7.2 g/dL (ref 6.0–8.3)

## 2016-04-07 LAB — LIPID PANEL
CHOLESTEROL: 177 mg/dL (ref 0–200)
HDL: 62 mg/dL (ref >39.00)
LDL CALC: 102 mg/dL — AB (ref 0–99)
NonHDL: 115.17
TRIGLYCERIDES: 68 mg/dL (ref 0.0–149.0)
Total CHOL/HDL Ratio: 3
VLDL: 13.6 mg/dL (ref 0.0–40.0)

## 2016-04-07 LAB — VITAMIN D 25 HYDROXY (VIT D DEFICIENCY, FRACTURES): VITD: 38.72 ng/mL (ref 30.00–100.00)

## 2016-04-11 ENCOUNTER — Other Ambulatory Visit: Payer: Self-pay | Admitting: *Deleted

## 2016-04-11 NOTE — Telephone Encounter (Signed)
Faxed refill request. Last Filled:    57 g 11 10/24/2014  Please advise. CPE 04/04/16.

## 2016-04-12 MED ORDER — SODIUM FLUORIDE 1.1 % DT CREA: 1.0000 "application " | TOPICAL_CREAM | Freq: Every evening | DENTAL | Status: DC

## 2016-04-12 NOTE — Telephone Encounter (Signed)
Sent.  I presume that she is going to be on this for the long run.  I would like her to ask her dentist at her next routine visit if there is any reason to change that plan.  Thanks.

## 2016-04-14 NOTE — Telephone Encounter (Signed)
Patient advised to ask dentist about ? Long term use.  Patient says it wasn't her regular dentist that prescribed this.  It was prescribed by the dentist at [facility] after her radiation treatment.  Patient will contact them to see if she is to continue the treatment.

## 2016-04-14 NOTE — Telephone Encounter (Signed)
Thanks

## 2016-08-12 ENCOUNTER — Encounter: Payer: Self-pay | Admitting: Family Medicine

## 2017-02-20 ENCOUNTER — Other Ambulatory Visit: Payer: Self-pay | Admitting: Family Medicine

## 2017-02-20 DIAGNOSIS — Z1231 Encounter for screening mammogram for malignant neoplasm of breast: Secondary | ICD-10-CM

## 2017-03-25 ENCOUNTER — Encounter: Payer: Self-pay | Admitting: *Deleted

## 2017-03-25 ENCOUNTER — Ambulatory Visit
Admission: RE | Admit: 2017-03-25 | Discharge: 2017-03-25 | Disposition: A | Discharge status: Home / Self Care | Payer: Medicare Other | Source: Ambulatory Visit | Attending: Family Medicine | Admitting: Family Medicine

## 2017-03-25 DIAGNOSIS — Z1231 Encounter for screening mammogram for malignant neoplasm of breast: Secondary | ICD-10-CM

## 2017-04-06 ENCOUNTER — Encounter: Payer: Self-pay | Admitting: Family Medicine

## 2017-04-06 ENCOUNTER — Ambulatory Visit (INDEPENDENT_AMBULATORY_CARE_PROVIDER_SITE_OTHER): Payer: Medicare Other | Admitting: Family Medicine

## 2017-04-06 VITALS — BP 102/62 | HR 64 | Temp 97.6°F | Ht 64.0 in | Wt 136.8 lb

## 2017-04-06 DIAGNOSIS — Z0000 Encounter for general adult medical examination without abnormal findings: Secondary | ICD-10-CM

## 2017-04-06 DIAGNOSIS — I10 Essential (primary) hypertension: Secondary | ICD-10-CM

## 2017-04-06 DIAGNOSIS — M8580 Other specified disorders of bone density and structure, unspecified site: Secondary | ICD-10-CM | POA: Diagnosis not present

## 2017-04-06 DIAGNOSIS — E785 Hyperlipidemia, unspecified: Secondary | ICD-10-CM

## 2017-04-06 DIAGNOSIS — C3490 Malignant neoplasm of unspecified part of unspecified bronchus or lung: Secondary | ICD-10-CM

## 2017-04-06 DIAGNOSIS — C07 Malignant neoplasm of parotid gland: Secondary | ICD-10-CM

## 2017-04-06 LAB — LIPID PANEL
CHOL/HDL RATIO: 2
Cholesterol: 166 mg/dL (ref 0–200)
HDL: 67.2 mg/dL (ref >39.00)
LDL CALC: 85 mg/dL (ref 0–99)
NonHDL: 99.25
TRIGLYCERIDES: 71 mg/dL (ref 0.0–149.0)
VLDL: 14.2 mg/dL (ref 0.0–40.0)

## 2017-04-06 LAB — VITAMIN D 25 HYDROXY (VIT D DEFICIENCY, FRACTURES): VITD: 43.89 ng/mL (ref 30.00–100.00)

## 2017-04-06 MED ORDER — ATORVASTATIN CALCIUM 20 MG PO TABS: 20.0000 mg | ORAL_TABLET | ORAL | 3 refills | Status: DC

## 2017-04-06 MED ORDER — METOPROLOL TARTRATE 25 MG PO TABS: ORAL_TABLET | ORAL | 3 refills | Status: DC

## 2017-04-06 MED ORDER — SODIUM FLUORIDE 1.1 % DT CREA: 1.0000 "application " | TOPICAL_CREAM | Freq: Every evening | DENTAL | 11 refills | Status: DC

## 2017-04-06 NOTE — Assessment & Plan Note (Signed)
  Flu yearly Shingles prev done PNA UTD Tetanus 2015 Colonoscopy declined due to age, d/w pt.  Breast cancer screening up to date  Advance directive- husband designated if patient were incapacitated.  Cognitive function addressed- see scanned forms- and if abnormal then additional documentation follows.  DXA declined, unable to tolerate fosamax, vit D pending, d/w pt.

## 2017-04-06 NOTE — Assessment & Plan Note (Signed)
Controlled, see notes on lipids. Continue current medication as is for now. If she gets lightheaded we may need to taper her medications. Update me as needed. She agrees.

## 2017-04-06 NOTE — Assessment & Plan Note (Signed)
Controlled, see notes on lipids. Continue current medication as is for now.

## 2017-04-06 NOTE — Assessment & Plan Note (Signed)
Per [facility], with no evidence of recurrent disease.

## 2017-04-06 NOTE — Assessment & Plan Note (Signed)
Per [facility], with no evidence of recurrent disease.

## 2017-04-06 NOTE — Patient Instructions (Addendum)
Don't change your meds for now.  Update me as needed.  Take care.  Glad to see you.  Go to the lab on the way out.  We'll contact you with your lab report.

## 2017-04-06 NOTE — Progress Notes (Signed)
I have personally reviewed the Medicare Annual Wellness questionnaire and have noted 1. The patient's medical and social history 2. Their use of alcohol, tobacco or illicit drugs 3. Their current medications and supplements 4. The patient's functional ability including ADL's, fall risks, home safety risks and hearing or visual             impairment. 5. Diet and physical activities 6. Evidence for depression or mood disorders  The patients weight, height, BMI have been recorded in the chart and visual acuity is per eye clinic.  I have made referrals, counseling and provided education to the patient based review of the above and I have provided the pt with a written personalized care plan for preventive services.  Provider list updated- see scanned forms.  Routine anticipatory guidance given to patient.  See health maintenance. The possibility exists that previously documented standard health maintenance information may have been brought forward from a previous encounter into this note.  If needed, that same information has been updated to reflect the current situation based on today's encounter.    Flu yearly Shingles prev done PNA UTD Tetanus 2015 Colonoscopy declined due to age, d/w pt.  Breast cancer screening up to date  Advance directive- husband designated if patient were incapacitated.  Cognitive function addressed- see scanned forms- and if abnormal then additional documentation follows.   History of osteopenia noted. DXA declined, unable to tolerate fosamax, vit D pending, d/w pt.   Hypertension:    Using medication without problems or lightheadedness: yes, except for sudden standing.  If worse, then she'll update me.   Chest pain with exertion:no Edema:no Short of breath:no CMET unremarkable at [facility], d/w pt, done 01/2017.    Elevated Cholesterol: Using medications without problems:yes Muscle aches: no Diet compliance: yes Exercise:yes Lab pending.    Cancer f/u- has OV  pending with [facility].  I'll defer, d/w pt.  Last onc note reviewed with patient.    PMH and SH reviewed  Meds, vitals, and allergies reviewed.   ROS: Per HPI.  Unless specifically indicated otherwise in HPI, the patient denies:  General: fever. Eyes: acute vision changes ENT: sore throat Cardiovascular: chest pain Respiratory: SOB GI: vomiting GU: dysuria Musculoskeletal: acute back pain Derm: acute rash Neuro: acute motor dysfunction Psych: worsening mood Endocrine: polydipsia Heme: bleeding Allergy: hayfever  GEN: nad, alert and oriented HEENT: mucous membranes moist, chronic postsurgical changes noted NECK: supple w/o LA CV: rrr. PULM: ctab, no inc wob ABD: soft, +bs EXT: no edema SKIN: no acute rash

## 2017-04-06 NOTE — Assessment & Plan Note (Signed)
Previously noted. Unable to tolerate Fosamax. Vitamin D normal. Continue as is with vitamin D. Defer DEXA at this point as it would likely not change management. She agrees.

## 2017-04-07 ENCOUNTER — Encounter: Payer: Self-pay | Admitting: *Deleted

## 2017-07-16 ENCOUNTER — Encounter: Payer: Self-pay | Admitting: Family Medicine

## 2017-07-16 ENCOUNTER — Ambulatory Visit (INDEPENDENT_AMBULATORY_CARE_PROVIDER_SITE_OTHER): Payer: Medicare Other | Admitting: Family Medicine

## 2017-07-16 VITALS — BP 134/70 | HR 54 | Temp 97.9°F | Wt 139.0 lb

## 2017-07-16 DIAGNOSIS — R17 Unspecified jaundice: Secondary | ICD-10-CM | POA: Insufficient documentation

## 2017-07-16 DIAGNOSIS — Z23 Encounter for immunization: Secondary | ICD-10-CM

## 2017-07-16 LAB — HEPATIC FUNCTION PANEL
ALT: 11 U/L (ref 0–35)
AST: 17 U/L (ref 0–37)
Albumin: 4.2 g/dL (ref 3.5–5.2)
Alkaline Phosphatase: 61 U/L (ref 39–117)
BILIRUBIN DIRECT: 0.1 mg/dL (ref 0.0–0.3)
BILIRUBIN TOTAL: 0.7 mg/dL (ref 0.2–1.2)
Total Protein: 7 g/dL (ref 6.0–8.3)

## 2017-07-16 NOTE — Addendum Note (Signed)
Addended by: Josetta Huddle on: 07/16/2017 11:43 AM   Modules accepted: Orders

## 2017-07-16 NOTE — Patient Instructions (Addendum)
Go to the lab on the way out.  We'll contact you with your lab report. We'll be in touch.  Take care.  Glad to see you.

## 2017-07-16 NOTE — Assessment & Plan Note (Addendum)
Incidental finding, prev wnl.  Other lfts wnl.  D/w pt.  No RUQ pain.  No alarming findings, no jaundice.  Recheck lfts today.  We'll go from there.  Feeling well.  If no sx and normal labs, then no f/u needed.  She agrees.   Flu shot today.

## 2017-07-16 NOTE — Progress Notes (Signed)
Notes from oncology:  Assessment & Plan: Whitney Manning is a 82 y.o. 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  # Salivary duct carcinoma: Treatment history summarized above. She is s/p surgical resection and RT in 2009/2010. After the development of two RUL lung nodules, she underwent a robotic-assisted right upper lobectomy on 01/25/2015 by Dr. 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 - Today, she continues doing well with no clinical evidence of recurrent or metastatic disease. CT chest 10/02 showed post-surgical changes of right upper lobectomy and left-sided pleurodesis with no evidence of local recurrence or metastatic disease. Labs reviewed -- tbili elevated to 1.5. This appears to be new, but patient is completely asymptomatic. We recommended repeat labs in about 2 weeks (either her or with PCP) to follow-up on this. Otherwise, we will see her back in 6 months for visit, labs, and CT scan prior. ============================================  She feels well.  She has an occ feeling of self resolving gas in the abd but o/w no abd sx.  T bili 1.5 at outside clinic recently.  Other LFTs wnl.  No FCNAVD.  No jaundice.  No blood in stool.   Meds, vitals, and allergies reviewed.   ROS: Per HPI unless specifically indicated in ROS section   GEN: nad, alert and oriented HEENT: mucous membranes moist NECK: supple w/o LA CV: rrr.   PULM: ctab, no inc wob ABD: soft, +bs, not ttp EXT: no edema SKIN: no acute rash, no jaundice.

## 2017-11-20 ENCOUNTER — Telehealth: Payer: Self-pay | Admitting: *Deleted

## 2017-11-20 DIAGNOSIS — H9190 Unspecified hearing loss, unspecified ear: Secondary | ICD-10-CM

## 2017-11-20 NOTE — Telephone Encounter (Signed)
Copied from [place] [id number]. Topic: Referral - Request >> Nov 20, 2017 10:03 AM Carolyn Stare wrote:  Pt call to say [place] need a referral from the doctor to see pt for hearing issue with right ear   Phone number [phone]  Fax number [phone]

## 2017-11-22 NOTE — Telephone Encounter (Signed)
I put in the referral.  Thanks.  

## 2018-02-28 ENCOUNTER — Other Ambulatory Visit: Payer: Self-pay

## 2018-02-28 ENCOUNTER — Encounter (HOSPITAL_COMMUNITY): Payer: Self-pay | Admitting: Emergency Medicine

## 2018-02-28 ENCOUNTER — Ambulatory Visit (HOSPITAL_COMMUNITY)
Admission: EM | Admit: 2018-02-28 | Discharge: 2018-02-28 | Disposition: A | Discharge status: Home / Self Care | Payer: Medicare Other | Attending: Family Medicine | Admitting: Family Medicine

## 2018-02-28 DIAGNOSIS — S30861A Insect bite (nonvenomous) of abdominal wall, initial encounter: Secondary | ICD-10-CM | POA: Diagnosis not present

## 2018-02-28 DIAGNOSIS — W57XXXA Bitten or stung by nonvenomous insect and other nonvenomous arthropods, initial encounter: Secondary | ICD-10-CM | POA: Diagnosis not present

## 2018-02-28 DIAGNOSIS — S30851A Superficial foreign body of abdominal wall, initial encounter: Secondary | ICD-10-CM

## 2018-02-28 MED ORDER — MUPIROCIN 2 % EX OINT: 1.0000 "application " | TOPICAL_OINTMENT | Freq: Three times a day (TID) | CUTANEOUS | 1 refills | Status: DC

## 2018-02-28 NOTE — ED Provider Notes (Signed)
[facility]   [id number] 02/28/18 Arrival Time: 1558   SUBJECTIVE:  Whitney Manning is a 83 y.o. female who presents to the urgent care with complaint of recent tick bite with partial extrication by patient and subsequent redness over the past 24 hours.     Past Medical History:  Diagnosis Date  . Arthritis   . Deafness in left ear   . Hyperlipidemia   . Hypertension   . Lung cancer ([place]) 2016   Stage I non-small cell lung cancer  . Osteopenia   . Pulmonary nodule    followed at [facility]  . Salivary gland carcinoma ([place]) 2016   Stage IV metastatic salivary gland carcinoma.   Family History  Problem Relation Age of Onset  . Stroke Mother   . Heart disease Father   . Breast cancer Neg Hx   . Colon cancer Neg Hx    Social History   Socioeconomic History  . Marital status: Married    Spouse name: Not on file  . Number of children: Not on file  . Years of education: Not on file  . Highest education level: Not on file  Occupational History  . Not on file  Social Needs  . Financial resource strain: Not on file  . Food insecurity:    Worry: Not on file    Inability: Not on file  . Transportation needs:    Medical: Not on file    Non-medical: Not on file  Tobacco Use  . Smoking status: Never Smoker  . Smokeless tobacco: Never Used  Substance and Sexual Activity  . Alcohol use: Yes    Comment: rare  . Drug use: No  . Sexual activity: Yes  Lifestyle  . Physical activity:    Days per week: Not on file    Minutes per session: Not on file  . Stress: Not on file  Relationships  . Social connections:    Talks on phone: Not on file    Gets together: Not on file    Attends religious service: Not on file    Active member of club or organization: Not on file    Attends meetings of clubs or organizations: Not on file    Relationship status: Not on file  . Intimate partner violence:    Fear of current or ex partner: Not on file    Emotionally abused: Not on  file    Physically abused: Not on file    Forced sexual activity: Not on file  Other Topics Concern  . Not on file  Social History Narrative   1 older brother alive, 1 died of dementia.     First husband died of colon cancer.  Remarried 1998.    1 son and 1 grandson   Retired, prev worked with insurance, then at [employer]   No outpatient medications have been marked as taking for the 02/28/18 encounter [facility] Encounter).   Allergies  Allergen Reactions  . Fosamax [Alendronate Sodium] Other (See Comments)    Nosebleeds.        ROS: As per HPI, remainder of ROS negative.   OBJECTIVE:   Vitals:   02/28/18 1653  BP: (!) 171/63  Pulse: 64  Resp: 18  Temp: 98.5 F (36.9 C)  TempSrc: Oral  SpO2: 99%     General appearance: alert; no distress Eyes: PERRL; EOMI; conjunctiva normal HENT: normocephalic; atraumatic;  Neck: supple Back: no CVA tenderness Extremities: no cyanosis or edema; symmetrical with no gross  deformities Skin: warm and dry; left groin shows remains of an embedded tick. There is some surrounding erythema in the distribution of the overlying Band-Aid Neurologic: normal gait; grossly normal Psychological: alert and cooperative; normal mood and affect      Labs:  Results for orders placed or performed in visit on 07/16/17  Hepatic function panel  Result Value Ref Range   Total Bilirubin 0.7 0.2 - 1.2 mg/dL   Bilirubin, Direct 0.1 0.0 - 0.3 mg/dL   Alkaline Phosphatase 61 39 - 117 U/L   AST 17 0 - 37 U/L   ALT 11 0 - 35 U/L   Total Protein 7.0 6.0 - 8.3 g/dL   Albumin 4.2 3.5 - 5.2 g/dL    Labs Reviewed - No data to display  No results found.     ASSESSMENT & PLAN:  1. Tick bite, initial encounter     Meds ordered this encounter  Medications  . mupirocin ointment (BACTROBAN) 2 %    Sig: Apply 1 application topically 3 (three) times daily.    Dispense:  22 g    Refill:  1    Reviewed expectations re: course of current medical  issues. Questions answered. Outlined signs and symptoms indicating need for more acute intervention. Patient verbalized understanding. After Visit Summary given.    Procedures:after a local injection of 2% Xylocaine with epi, the remains of the tic were removed using a #15 Bard-Parker scalpel blade. There is no significant bleeding and no complications. The entire tick remains were removed      Robyn Haber, MD 02/28/18 1736

## 2018-02-28 NOTE — ED Triage Notes (Signed)
Patient reports a tick on her on Saturday.  Patient reports she partially removed a tick.  Last night noticed the area itching

## 2018-03-05 ENCOUNTER — Other Ambulatory Visit: Payer: Self-pay | Admitting: Family Medicine

## 2018-03-05 DIAGNOSIS — Z1231 Encounter for screening mammogram for malignant neoplasm of breast: Secondary | ICD-10-CM

## 2018-03-30 ENCOUNTER — Ambulatory Visit
Admission: RE | Admit: 2018-03-30 | Discharge: 2018-03-30 | Disposition: A | Discharge status: Home / Self Care | Payer: Medicare Other | Source: Ambulatory Visit | Attending: Family Medicine | Admitting: Family Medicine

## 2018-03-30 DIAGNOSIS — Z1231 Encounter for screening mammogram for malignant neoplasm of breast: Secondary | ICD-10-CM

## 2018-03-31 ENCOUNTER — Encounter: Payer: Self-pay | Admitting: *Deleted

## 2018-04-09 ENCOUNTER — Encounter: Payer: Self-pay | Admitting: Family Medicine

## 2018-04-09 ENCOUNTER — Ambulatory Visit (INDEPENDENT_AMBULATORY_CARE_PROVIDER_SITE_OTHER): Payer: Medicare Other | Admitting: Family Medicine

## 2018-04-09 VITALS — BP 92/68 | HR 54 | Temp 97.7°F | Ht 63.0 in | Wt 135.0 lb

## 2018-04-09 DIAGNOSIS — Z7189 Other specified counseling: Secondary | ICD-10-CM

## 2018-04-09 DIAGNOSIS — M8580 Other specified disorders of bone density and structure, unspecified site: Secondary | ICD-10-CM

## 2018-04-09 DIAGNOSIS — Z0000 Encounter for general adult medical examination without abnormal findings: Secondary | ICD-10-CM | POA: Diagnosis not present

## 2018-04-09 DIAGNOSIS — Z952 Presence of prosthetic heart valve: Secondary | ICD-10-CM | POA: Diagnosis not present

## 2018-04-09 DIAGNOSIS — C3490 Malignant neoplasm of unspecified part of unspecified bronchus or lung: Secondary | ICD-10-CM | POA: Diagnosis not present

## 2018-04-09 DIAGNOSIS — I10 Essential (primary) hypertension: Secondary | ICD-10-CM

## 2018-04-09 DIAGNOSIS — E785 Hyperlipidemia, unspecified: Secondary | ICD-10-CM | POA: Diagnosis not present

## 2018-04-09 DIAGNOSIS — C089 Malignant neoplasm of major salivary gland, unspecified: Secondary | ICD-10-CM | POA: Diagnosis not present

## 2018-04-09 LAB — LIPID PANEL
Cholesterol: 182 mg/dL (ref 0–200)
HDL: 65.9 mg/dL (ref >39.00)
LDL Cholesterol: 96 mg/dL (ref 0–99)
NONHDL: 116.28
Total CHOL/HDL Ratio: 3
Triglycerides: 101 mg/dL (ref 0.0–149.0)
VLDL: 20.2 mg/dL (ref 0.0–40.0)

## 2018-04-09 LAB — VITAMIN D 25 HYDROXY (VIT D DEFICIENCY, FRACTURES): VITD: 45.54 ng/mL (ref 30.00–100.00)

## 2018-04-09 MED ORDER — METOPROLOL TARTRATE 25 MG PO TABS: 25.0000 mg | ORAL_TABLET | Freq: Two times a day (BID) | ORAL | 3 refills | Status: DC

## 2018-04-09 MED ORDER — ATORVASTATIN CALCIUM 20 MG PO TABS: 20.0000 mg | ORAL_TABLET | ORAL | 3 refills | Status: DC

## 2018-04-09 NOTE — Progress Notes (Signed)
I have personally reviewed the Medicare Annual Wellness questionnaire and have noted 1. The patient's medical and social history 2. Their use of alcohol, tobacco or illicit drugs 3. Their current medications and supplements 4. The patient's functional ability including ADL's, fall risks, home safety risks and hearing or visual             impairment. 5. Diet and physical activities 6. Evidence for depression or mood disorders  The patients weight, height, BMI have been recorded in the chart and visual acuity is per eye clinic.  I have made referrals, counseling and provided education to the patient based review of the above and I have provided the pt with a written personalized care plan for preventive services.  Provider list updated- see scanned forms.  Routine anticipatory guidance given to patient.  See health maintenance. The possibility exists that previously documented standard health maintenance information may have been brought forward from a previous encounter into this note.  If needed, that same information has been updated to reflect the current situation based on today's encounter.    Flu yearly Shingles out of stock.   PNA UTD Tetanus 2015 Colonoscopy declined due to age, d/w pt.  Breast cancer screening up to date  Advance directive- husband designated if patient were incapacitated.  Cognitive function addressed- see scanned forms- and if abnormal then additional documentation follows.   History of osteopenia noted. DXA declined, unable to tolerate fosamax, vit D pending, d/w pt. see notes on labs.    Hypertension:    Using medication without problems or lightheadedness: rarely lightheaded, with sudden position changes.   Routine cautions d/w pt.   Chest pain with exertion: no Edema:no Short of breath:no Labs from 01/05/18 at [facility] d/w pt.    H/o valve replacement.  D/w pt about routine cards f/u.  See above re: HTN.   Elevated Cholesterol: Using medications without  problems: yes Muscle aches: no Diet compliance: yes Exercise:yes Labs pending.   Cancer f/u- has OV pending with [facility].  I'll defer, d/w pt.  Last onc note reviewed with patient.  prev CT d/w pt: Overall stable appearance of the chest, compatible with right upper lobe lobectomy. No evidence of recurrent tumor or metastatic disease.  PMH and SH reviewed  Meds, vitals, and allergies reviewed.   ROS: Per HPI.  Unless specifically indicated otherwise in HPI, the patient denies:  General: fever. Eyes: acute vision changes ENT: sore throat Cardiovascular: chest pain Respiratory: SOB GI: vomiting GU: dysuria Musculoskeletal: acute back pain Derm: acute rash Neuro: acute motor dysfunction Psych: worsening mood Endocrine: polydipsia Heme: bleeding Allergy: hayfever  GEN: nad, alert and oriented HEENT: mucous membranes moist, chronic smile changes with weakness on the L side at baseline.   NECK: supple w/o LA CV: rrr. PULM: ctab, no inc wob ABD: soft, +bs EXT: no edema SKIN: well perfused.

## 2018-04-09 NOTE — Patient Instructions (Addendum)
Cut back on the metoprolol.  Update me as needed.   Take care.  Glad to see you.  Go to the lab on the way out.  We'll contact you with your lab report. Update me as needed.

## 2018-04-11 NOTE — Assessment & Plan Note (Signed)
No change in meds.  Continue as is.  See notes on labs.

## 2018-04-11 NOTE — Assessment & Plan Note (Signed)
Flu yearly Shingles out of stock.   PNA UTD Tetanus 2015 Colonoscopy declined due to age, d/w pt.  Breast cancer screening up to date  Advance directive- husband designated if patient were incapacitated.  Cognitive function addressed- see scanned forms- and if abnormal then additional documentation follows.

## 2018-04-11 NOTE — Assessment & Plan Note (Signed)
She'll cut back on the metoprolol.  Update me as needed.   See notes on labs.  D/w pt about plan and she agrees.

## 2018-04-11 NOTE — Assessment & Plan Note (Signed)
Per cards.  I'll defer.  She agrees.  No CP, not SOB.  No BLE edema.

## 2018-04-11 NOTE — Assessment & Plan Note (Signed)
History of osteopenia noted. DXA declined, unable to tolerate fosamax, vit D pending, d/w pt. see notes on labs.

## 2018-04-11 NOTE — Assessment & Plan Note (Signed)
Advance directive- husband designated if patient were incapacitated.

## 2018-04-11 NOTE — Assessment & Plan Note (Signed)
Hx of.  Prev CT d/w pt: Overall stable appearance of the chest, compatible with right upper lobe lobectomy. No evidence of recurrent tumor or metastatic disease. I'll defer to onc, patient agrees.

## 2018-04-12 ENCOUNTER — Encounter: Payer: Self-pay | Admitting: *Deleted

## 2018-04-12 ENCOUNTER — Encounter: Payer: Self-pay | Admitting: Family Medicine

## 2018-04-12 LAB — LAB REPORT - SCANNED
ALT: 23
AST: 34
CALCIUM: 9.2
CREATININE: 0.89
Glucose: 87

## 2018-04-16 ENCOUNTER — Other Ambulatory Visit: Payer: Self-pay | Admitting: Family Medicine

## 2018-06-15 ENCOUNTER — Telehealth: Payer: Self-pay | Admitting: Family Medicine

## 2018-06-15 MED ORDER — TRIAMCINOLONE ACETONIDE 0.1 % EX CREA: 1.0000 "application " | TOPICAL_CREAM | Freq: Two times a day (BID) | CUTANEOUS | 0 refills | Status: DC | PRN

## 2018-06-15 NOTE — Telephone Encounter (Signed)
She was at her husband's appointment today.  Recently with poison oak or ivy exposure.  Facial rash, and on the hands and neck.   She had been working in the yard.   Itches.   No fevers.   Vesicular rash on the hand, linear, no vesicles on the face or neck.  Blanches.    Dermatitis:  She used OTC hydrocortisone.  D/w pt about about using TAC on the neck and hands.  Hydrocortisone can be used on the face.  She agrees.  She'll update me as needed.

## 2018-06-22 ENCOUNTER — Other Ambulatory Visit: Payer: Self-pay | Admitting: Family Medicine

## 2018-06-23 ENCOUNTER — Other Ambulatory Visit: Payer: Self-pay | Admitting: Family Medicine

## 2018-06-23 NOTE — Telephone Encounter (Signed)
Electronic refill request Last office visit 04/09/18 Refill request does not match medication list See medication directions at office visit with new directions which was set to print and not sent to the pharmacy Please confirm directions

## 2018-06-23 NOTE — Telephone Encounter (Signed)
rx updated based on last note and sent.  Thanks.

## 2019-02-22 ENCOUNTER — Other Ambulatory Visit: Payer: Self-pay | Admitting: Family Medicine

## 2019-02-22 DIAGNOSIS — Z1231 Encounter for screening mammogram for malignant neoplasm of breast: Secondary | ICD-10-CM

## 2019-04-11 ENCOUNTER — Encounter: Payer: Medicare Other | Admitting: Family Medicine

## 2019-04-14 ENCOUNTER — Other Ambulatory Visit: Payer: Self-pay

## 2019-04-14 ENCOUNTER — Ambulatory Visit
Admission: RE | Admit: 2019-04-14 | Discharge: 2019-04-14 | Disposition: A | Discharge status: Home / Self Care | Payer: Medicare Other | Source: Ambulatory Visit | Attending: Family Medicine | Admitting: Family Medicine

## 2019-04-14 DIAGNOSIS — Z1231 Encounter for screening mammogram for malignant neoplasm of breast: Secondary | ICD-10-CM

## 2019-04-18 ENCOUNTER — Ambulatory Visit (INDEPENDENT_AMBULATORY_CARE_PROVIDER_SITE_OTHER): Payer: Medicare Other | Admitting: Family Medicine

## 2019-04-18 ENCOUNTER — Encounter: Payer: Self-pay | Admitting: Family Medicine

## 2019-04-18 DIAGNOSIS — Z7189 Other specified counseling: Secondary | ICD-10-CM

## 2019-04-18 DIAGNOSIS — I10 Essential (primary) hypertension: Secondary | ICD-10-CM

## 2019-04-18 DIAGNOSIS — Z952 Presence of prosthetic heart valve: Secondary | ICD-10-CM | POA: Diagnosis not present

## 2019-04-18 DIAGNOSIS — C089 Malignant neoplasm of major salivary gland, unspecified: Secondary | ICD-10-CM

## 2019-04-18 DIAGNOSIS — C3490 Malignant neoplasm of unspecified part of unspecified bronchus or lung: Secondary | ICD-10-CM

## 2019-04-18 DIAGNOSIS — Z0000 Encounter for general adult medical examination without abnormal findings: Secondary | ICD-10-CM

## 2019-04-18 DIAGNOSIS — E785 Hyperlipidemia, unspecified: Secondary | ICD-10-CM

## 2019-04-18 MED ORDER — METOPROLOL TARTRATE 25 MG PO TABS: ORAL_TABLET | ORAL | 3 refills | Status: DC

## 2019-04-18 MED ORDER — ATORVASTATIN CALCIUM 20 MG PO TABS: ORAL_TABLET | ORAL | 3 refills | Status: DC

## 2019-04-18 NOTE — Patient Instructions (Signed)
Don't change your meds for now.  Update me as needed.  Take care.  Glad to see you.  Thanks for your effort.

## 2019-04-18 NOTE — Progress Notes (Signed)
I have personally reviewed the Medicare Annual Wellness questionnaire and have noted 1. The patient's medical and social history 2. Their use of alcohol, tobacco or illicit drugs 3. Their current medications and supplements 4. The patient's functional ability including ADL's, fall risks, home safety risks and hearing or visual             impairment. 5. Diet and physical activities 6. Evidence for depression or mood disorders  The patients weight, height, BMI have been recorded in the chart and visual acuity is per eye clinic.  I have made referrals, counseling and provided education to the patient based review of the above and I have provided the pt with a written personalized care plan for preventive services.  Provider list updated- see scanned forms.  Routine anticipatory guidance given to patient.  See health maintenance. The possibility exists that previously documented standard health maintenance information may have been brought forward from a previous encounter into this note.  If needed, that same information has been updated to reflect the current situation based on today's encounter.    Fluyearly Shingles d/w pt.  She'll check on getting second dose.   PNAUTD Tetanus2015 Colonoscopy declined due to age, d/w pt. Breast cancer screeningup to date Advance directive- husband designated if patient were incapacitated. Cognitive function addressed- see scanned forms- and if abnormal then additional documentation follows.  History of osteopenia noted.DXA declined, unable to tolerate fosamax, vit D pending, d/w pt.see notes on labs.    Hypertension:               Using medication without problems or lightheadedness: rarely lightheaded with sudden position change and cautions d/w pt.  "it's not bad."   Chest pain with exertion: no Edema:no Short of breath:no Labs from 2020 at [facility] d/w pt.    H/o valve replacement.  D/w pt about routine cards f/u, pending.  See above re:  HTN.   Elevated Cholesterol: Using medications without problems: yes Muscle aches: no Diet compliance: yes Exercise:yes Labs d/w pt.    Cancer f/u- had OV with [facility]. I'll defer, d/w pt. Last onc note reviewed with patient. status d/w pt: No evidence of recurrent tumor or metastatic disease.  PMH and SH reviewed  Meds, vitals, and allergies reviewed.   ROS: Per HPI.  Unless specifically indicated otherwise in HPI, the patient denies:  General: fever. Eyes: acute vision changes ENT: sore throat Cardiovascular: chest pain Respiratory: SOB GI: vomiting GU: dysuria Musculoskeletal: acute back pain Derm: acute rash Neuro: acute motor dysfunction Psych: worsening mood Endocrine: polydipsia Heme: bleeding Allergy: hayfever  GEN: nad, alert and oriented HEENT: ncat NECK: supple w/o LA CV: rrr. PULM: ctab, no inc wob ABD: soft, +bs EXT: no edema SKIN: no acute rash but asymptomatic spider veins noted on the BLE

## 2019-04-21 NOTE — Assessment & Plan Note (Signed)
Per [facility] clinic.  I will defer.  She agrees.  I appreciate the help of all involved.

## 2019-04-21 NOTE — Assessment & Plan Note (Signed)
No change in meds.  Labs discussed with patient.  She agrees.  Continue work on diet and exercise.

## 2019-04-21 NOTE — Assessment & Plan Note (Signed)
Per [facility] clinic.  I will defer.  She agrees.  I appreciate the help of all involved.

## 2019-04-21 NOTE — Assessment & Plan Note (Signed)
Advance directive- husband designated if patient were incapacitated.

## 2019-04-21 NOTE — Assessment & Plan Note (Signed)
No change in meds.  Labs discussed with patient.  She agrees.  Continue work on diet and exercise.  No chest pain or shortness of breath or swelling.  Okay for outpatient follow-up.

## 2019-04-21 NOTE — Assessment & Plan Note (Signed)
Fluyearly Shingles d/w pt.  She'll check on getting second dose.   PNAUTD Tetanus2015 Colonoscopy declined due to age, d/w pt. Breast cancer screeningup to date Advance directive- husband designated if patient were incapacitated. Cognitive function addressed- see scanned forms- and if abnormal then additional documentation follows. History of osteopenia noted.DXA declined, unable to tolerate fosamax, vit D pending, d/w pt.see notes on labs.

## 2020-03-12 ENCOUNTER — Other Ambulatory Visit: Payer: Self-pay | Admitting: Family Medicine

## 2020-03-12 DIAGNOSIS — Z1231 Encounter for screening mammogram for malignant neoplasm of breast: Secondary | ICD-10-CM

## 2020-04-15 ENCOUNTER — Other Ambulatory Visit: Payer: Self-pay | Admitting: Family Medicine

## 2020-04-16 ENCOUNTER — Other Ambulatory Visit: Payer: Self-pay

## 2020-04-16 ENCOUNTER — Ambulatory Visit
Admission: RE | Admit: 2020-04-16 | Discharge: 2020-04-16 | Disposition: A | Discharge status: Home / Self Care | Payer: Medicare Other | Source: Ambulatory Visit | Attending: Family Medicine | Admitting: Family Medicine

## 2020-04-16 DIAGNOSIS — Z1231 Encounter for screening mammogram for malignant neoplasm of breast: Secondary | ICD-10-CM

## 2020-04-18 ENCOUNTER — Other Ambulatory Visit: Payer: Self-pay | Admitting: Family Medicine

## 2020-04-19 ENCOUNTER — Other Ambulatory Visit: Payer: Self-pay | Admitting: Family Medicine

## 2020-04-19 ENCOUNTER — Other Ambulatory Visit: Payer: Self-pay

## 2020-04-19 ENCOUNTER — Ambulatory Visit (INDEPENDENT_AMBULATORY_CARE_PROVIDER_SITE_OTHER): Payer: Medicare Other | Admitting: Family Medicine

## 2020-04-19 ENCOUNTER — Encounter: Payer: Self-pay | Admitting: Family Medicine

## 2020-04-19 VITALS — BP 124/72 | HR 64 | Temp 97.8°F | Ht 63.0 in | Wt 135.0 lb

## 2020-04-19 DIAGNOSIS — M8580 Other specified disorders of bone density and structure, unspecified site: Secondary | ICD-10-CM | POA: Diagnosis not present

## 2020-04-19 DIAGNOSIS — Z952 Presence of prosthetic heart valve: Secondary | ICD-10-CM

## 2020-04-19 DIAGNOSIS — R928 Other abnormal and inconclusive findings on diagnostic imaging of breast: Secondary | ICD-10-CM

## 2020-04-19 DIAGNOSIS — Z0000 Encounter for general adult medical examination without abnormal findings: Secondary | ICD-10-CM

## 2020-04-19 DIAGNOSIS — C3490 Malignant neoplasm of unspecified part of unspecified bronchus or lung: Secondary | ICD-10-CM

## 2020-04-19 DIAGNOSIS — E785 Hyperlipidemia, unspecified: Secondary | ICD-10-CM

## 2020-04-19 DIAGNOSIS — C089 Malignant neoplasm of major salivary gland, unspecified: Secondary | ICD-10-CM

## 2020-04-19 DIAGNOSIS — M7989 Other specified soft tissue disorders: Secondary | ICD-10-CM

## 2020-04-19 DIAGNOSIS — Z7189 Other specified counseling: Secondary | ICD-10-CM

## 2020-04-19 LAB — LIPID PANEL
Cholesterol: 182 mg/dL (ref 0–200)
HDL: 64.8 mg/dL (ref >39.00)
LDL Cholesterol: 101 mg/dL — ABNORMAL HIGH (ref 0–99)
NonHDL: 117.09
Total CHOL/HDL Ratio: 3
Triglycerides: 78 mg/dL (ref 0.0–149.0)
VLDL: 15.6 mg/dL (ref 0.0–40.0)

## 2020-04-19 LAB — COMPREHENSIVE METABOLIC PANEL
ALT: 12 U/L (ref 0–35)
AST: 21 U/L (ref 0–37)
Albumin: 4.5 g/dL (ref 3.5–5.2)
Alkaline Phosphatase: 76 U/L (ref 39–117)
BUN: 18 mg/dL (ref 6–23)
CO2: 27 mEq/L (ref 19–32)
Calcium: 9.4 mg/dL (ref 8.4–10.5)
Chloride: 103 mEq/L (ref 96–112)
Creatinine, Ser: 0.92 mg/dL (ref 0.40–1.20)
GFR: 58.34 mL/min — ABNORMAL LOW (ref >60.00)
Glucose, Bld: 92 mg/dL (ref 70–99)
Potassium: 4.3 mEq/L (ref 3.5–5.1)
Sodium: 138 mEq/L (ref 135–145)
Total Bilirubin: 0.8 mg/dL (ref 0.2–1.2)
Total Protein: 7 g/dL (ref 6.0–8.3)

## 2020-04-19 LAB — VITAMIN D 25 HYDROXY (VIT D DEFICIENCY, FRACTURES): VITD: 51.71 ng/mL (ref 30.00–100.00)

## 2020-04-19 MED ORDER — METOPROLOL TARTRATE 25 MG PO TABS: ORAL_TABLET | ORAL | 3 refills | Status: DC

## 2020-04-19 MED ORDER — LORATADINE 10 MG PO TABS
10.0000 mg | ORAL_TABLET | Freq: Every day | ORAL | Status: DC
Start: 2020-04-19 — End: 2020-08-02

## 2020-04-19 MED ORDER — ATORVASTATIN CALCIUM 20 MG PO TABS: ORAL_TABLET | ORAL | 3 refills | Status: DC

## 2020-04-19 MED ORDER — CEPHALEXIN 500 MG PO CAPS: 500.0000 mg | ORAL_CAPSULE | Freq: Three times a day (TID) | ORAL | 0 refills | Status: DC

## 2020-04-19 NOTE — Progress Notes (Signed)
This visit occurred during the SARS-CoV-2 public health emergency.  Safety protocols were in place, including screening questions prior to the visit, additional usage of staff PPE, and extensive cleaning of exam room while observing appropriate contact time as indicated for disinfecting solutions.  I have personally reviewed the Medicare Annual Wellness questionnaire and have noted 1. The patient's medical and social history 2. Their use of alcohol, tobacco or illicit drugs 3. Their current medications and supplements 4. The patient's functional ability including ADL's, fall risks, home safety risks and hearing or visual             impairment. 5. Diet and physical activities 6. Evidence for depression or mood disorders  The patients weight, height, BMI have been recorded in the chart and visual acuity is per eye clinic.  I have made referrals, counseling and provided education to the patient based review of the above and I have provided the pt with a written personalized care plan for preventive services.  Provider list updated- see scanned forms.  Routine anticipatory guidance given to patient.  See health maintenance. The possibility exists that previously documented standard health maintenance information may have been brought forward from a previous encounter into this note.  If needed, that same information has been updated to reflect the current situation based on today's encounter.    Fluyearly Shingles prev done.   PNAUTD Tetanus2015 covid vaccine 2021, Moderna Colonoscopy declined due to age, d/w pt. Breast cancer screeningd/w pt.   Advance directive- husband designated if patient were incapacitated. Cognitive function addressed- see scanned forms- and if abnormal then additional documentation follows. History of osteopenia noted.DXA declined, unable to tolerate fosamax, vit D pending, d/w pt.see notes on labs.  She is still seeing the cardiac clinic at [facility]  periodically.  She was released from the ENT clinic.  I asked her to check with her dentist about continued fluoride use.    R 3rd finger swelling and pain.  She was working in the yard and got stung by yellow jacket.  Stinger removed.  Incident was 2 days ago.  Was getting better but puffier now.  No fevers.  Feels stiffer from local edema, noted with ROM.  No wheeze, not SOB.  Cortisone cream helps with itching.    Elevated Cholesterol: Using medications without problems: yes Muscle aches: no Diet compliance: yes Exercise: yes Still on lipitor.    H/o aortic valve replacement.   Using medication without problems or lightheadedness: compliant.  No issues except brief lightheadedness with sudden standing. No syncope.  Cautions d/w pt.   Chest pain with exertion:no Edema:no Short of breath:no Still on metoprolol.    PMH and SH reviewed  Meds, vitals, and allergies reviewed.   ROS: Per HPI.  Unless specifically indicated otherwise in HPI, the patient denies:  General: fever. Eyes: acute vision changes ENT: sore throat Cardiovascular: chest pain Respiratory: SOB GI: vomiting GU: dysuria Musculoskeletal: acute back pain Derm: acute rash Neuro: acute motor dysfunction Psych: worsening mood Endocrine: polydipsia Heme: bleeding Allergy: hayfever  GEN: nad, alert and oriented HEENT: ncat NECK: supple w/o LA CV: rrr. PULM: ctab, no inc wob ABD: soft, +bs EXT: no edema SKIN: no acute rash but R 3rd finger puffy and slightly reddish.

## 2020-04-19 NOTE — Patient Instructions (Addendum)
You should get a call about follow up imaging for your mammogram.   Go to the lab on the way out.   If you have mychart we'll likely use that to update you.    Keep using cortisone cream and take claritin 10mg  a day.  You can get that over the counter.  Don't take claritin D.  Start taking keflex in the meantime.  If your hand isn't getting better or if it is getting worse, then let us know or get rechecked.    Take care.  Glad to see you.

## 2020-04-22 DIAGNOSIS — M7989 Other specified soft tissue disorders: Secondary | ICD-10-CM | POA: Insufficient documentation

## 2020-04-22 NOTE — Assessment & Plan Note (Signed)
Fluyearly Shingles prev done.   PNAUTD Tetanus2015 covid vaccine 2021, Moderna Colonoscopy declined due to age, d/w pt. Breast cancer screeningd/w pt.   Advance directive- husband designated if patient were incapacitated. Cognitive function addressed- see scanned forms- and if abnormal then additional documentation follows. History of osteopenia noted.DXA declined, unable to tolerate fosamax, vit D pending, d/w pt.see notes on labs.

## 2020-04-22 NOTE — Assessment & Plan Note (Signed)
Discussed options.  Given the timeline, where she was getting better but now puffier, would start Keflex with routine cautions and have her update [place] as needed.  She agrees with plan.  Still okay for outpatient follow-up.

## 2020-04-22 NOTE — Assessment & Plan Note (Signed)
Previously followed by [facility].  I will defer.  She agrees.

## 2020-04-22 NOTE — Assessment & Plan Note (Signed)
Advance directive- husband designated if patient were incapacitated.

## 2020-04-22 NOTE — Assessment & Plan Note (Signed)
History of osteopenia noted.DXA declined, unable to tolerate fosamax, vit D pending, d/w pt.see notes on labs.

## 2020-04-22 NOTE — Assessment & Plan Note (Signed)
Previously followed by [facility].  I will defer.  She agrees.

## 2020-04-22 NOTE — Assessment & Plan Note (Signed)
Continue metoprolol.  Discussed cautions for quick standing.  Okay for outpatient follow-up.  She agrees.

## 2020-04-22 NOTE — Assessment & Plan Note (Signed)
Continue Lipitor.  Continue work on diet and exercise.  She agrees.

## 2020-04-25 ENCOUNTER — Ambulatory Visit
Admission: RE | Admit: 2020-04-25 | Discharge: 2020-04-25 | Disposition: A | Discharge status: Home / Self Care | Payer: Medicare Other | Source: Ambulatory Visit | Attending: Family Medicine | Admitting: Family Medicine

## 2020-04-25 ENCOUNTER — Ambulatory Visit: Payer: Medicare Other

## 2020-04-25 ENCOUNTER — Other Ambulatory Visit: Payer: Self-pay

## 2020-04-25 DIAGNOSIS — R928 Other abnormal and inconclusive findings on diagnostic imaging of breast: Secondary | ICD-10-CM

## 2020-08-02 ENCOUNTER — Encounter: Payer: Self-pay | Admitting: Family Medicine

## 2020-08-02 ENCOUNTER — Other Ambulatory Visit: Payer: Self-pay

## 2020-08-02 ENCOUNTER — Ambulatory Visit: Payer: Medicare Other | Admitting: Family Medicine

## 2020-08-02 DIAGNOSIS — R2991 Unspecified symptoms and signs involving the musculoskeletal system: Secondary | ICD-10-CM

## 2020-08-02 DIAGNOSIS — I10 Essential (primary) hypertension: Secondary | ICD-10-CM | POA: Diagnosis not present

## 2020-08-02 MED ORDER — METOPROLOL TARTRATE 25 MG PO TABS: ORAL_TABLET | ORAL | Status: DC

## 2020-08-02 NOTE — Progress Notes (Signed)
This visit occurred during the SARS-CoV-2 public health emergency.  Safety protocols were in place, including screening questions prior to the visit, additional usage of staff PPE, and extensive cleaning of exam room while observing appropriate contact time as indicated for disinfecting solutions.  D/w pt about Metoprolol dose.  She can get lightheaded transiently on quick standing.  She'll cut dose in half and update me.  Routine cautions given to patient.  She went walking in old tennis shoes.  Since then she got new shoes.  With walking the R 2nd-5th toes feel cold (but not cold to the touch). No pain.  No sx on waking.  Only noted with walking.  No numbness or tingling.  No R1st toe sx.  No L foot sx.  No bruising, no redness.    Meds, vitals, and allergies reviewed.   ROS: Per HPI unless specifically indicated in ROS section   nad ncat Not orthostatic on standing at office visit. rrr Right foot with intact dorsalis pedis pulse.  No bruising or erythema.  Foot is not cold to the touch.  Normal range of motion of the toes.  She does have some loss of transverse arch along the MCP joints.  No ulceration.  Sensation intact.  Skin well perfused.

## 2020-08-02 NOTE — Patient Instructions (Addendum)
Cut metoprolol back to 1/2 tab twice a day.  Take care.  Glad to see you.  Try getting soft full length arch support inserts and use those.    [email or web address]- green inserts.

## 2020-08-05 DIAGNOSIS — R2991 Unspecified symptoms and signs involving the musculoskeletal system: Secondary | ICD-10-CM | POA: Insufficient documentation

## 2020-08-05 NOTE — Assessment & Plan Note (Signed)
I think she likely has mechanical irritation related to some loss of longitudinal and transverse arch in the right foot that was aggravated by walking in all tennis shoes.  This could be causing the altered sensation that she had noted. On exam she still neurovascular intact.  Discussed that she can try getting soft full length arch support inserts and use those.   She will update me as needed.  The extra cushion and arch support may correct the issue and give her time to improve.

## 2020-08-05 NOTE — Assessment & Plan Note (Signed)
She will cut her metoprolol dose in half and update me as needed.  Routine cautions given to patient.  She agrees.

## 2020-09-07 ENCOUNTER — Ambulatory Visit: Payer: Medicare Other | Attending: Internal Medicine

## 2020-09-07 DIAGNOSIS — Z23 Encounter for immunization: Secondary | ICD-10-CM

## 2020-09-07 NOTE — Progress Notes (Signed)
   Covid-19 Vaccination Clinic  Name:  Whitney Manning    MRN: [id number] DOB: [birth year]  09/07/2020  Whitney Manning was observed post Covid-19 immunization for 15 minutes without incident. She was provided with Vaccine Information Sheet and instruction to access the V-Safe system.   Whitney Manning was instructed to call 911 with any severe reactions post vaccine: Marland Kitchen Difficulty breathing  . Swelling of face and throat  . A fast heartbeat  . A bad rash all over body  . Dizziness and weakness   Immunizations Administered    Name Date Dose VIS Date Route   Pfizer COVID-19 Vaccine 09/07/2020  1:46 PM 0.3 mL 07/25/2020 Intramuscular   Manufacturer: [place]   Lot: [id number]   NDC: [address]-0

## 2021-03-20 ENCOUNTER — Other Ambulatory Visit: Payer: Self-pay | Admitting: Family Medicine

## 2021-03-20 ENCOUNTER — Other Ambulatory Visit: Payer: Self-pay

## 2021-03-20 DIAGNOSIS — Z1231 Encounter for screening mammogram for malignant neoplasm of breast: Secondary | ICD-10-CM

## 2021-04-23 ENCOUNTER — Encounter: Payer: Medicare Other | Admitting: Family Medicine

## 2021-04-25 ENCOUNTER — Ambulatory Visit (INDEPENDENT_AMBULATORY_CARE_PROVIDER_SITE_OTHER): Payer: Medicare Other

## 2021-04-25 ENCOUNTER — Other Ambulatory Visit: Payer: Self-pay | Admitting: Family Medicine

## 2021-04-25 DIAGNOSIS — Z0000 Encounter for general adult medical examination without abnormal findings: Secondary | ICD-10-CM

## 2021-04-25 NOTE — Progress Notes (Signed)
PCP notes:  Health Maintenance: No gaps noted    Abnormal Screenings: none   Patient concerns: none   Nurse concerns: none   Next PCP appt.: 04/29/2021 @ 9 am

## 2021-04-25 NOTE — Patient Instructions (Signed)
Ms. Whitney Manning , Thank you for taking time to come for your Medicare Wellness Visit. I appreciate your ongoing commitment to your health goals. Please review the following plan we discussed and let me know if I can assist you in the future.   Screening recommendations/referrals: Colonoscopy: no longer required  Mammogram: scheduled 05/14/2021 Bone Density: declined Recommended yearly ophthalmology/optometry visit for glaucoma screening and checkup Recommended yearly dental visit for hygiene and checkup  Vaccinations: Influenza vaccine: due Fall 2022  Pneumococcal vaccine: Completed series Tdap vaccine: Up to date, completed 09/21/2013, due 09/2023 Shingles vaccine: Completed series   Covid-19:completed 3 vaccines   Advanced directives: Advance directive discussed with you today. Even though you declined this today please call our office should you change your mind and we can give you the proper paperwork for you to fill out.   Conditions/risks identified: hypertension, hyperlipidemia  Next appointment: Follow up in one year for your annual wellness visit     Preventive Care 33 Years and Older, Female Preventive care refers to lifestyle choices and visits with your health care provider that can promote health and wellness. What does preventive care include? A yearly physical exam. This is also called an annual well check. Dental exams once or twice a year. Routine eye exams. Ask your health care provider how often you should have your eyes checked. Personal lifestyle choices, including: Daily care of your teeth and gums. Regular physical activity. Eating a healthy diet. Avoiding tobacco and drug use. Limiting alcohol use. Practicing safe sex. Taking low-dose aspirin every day. Taking vitamin and mineral supplements as recommended by your health care provider. What happens during an annual well check? The services and screenings done by your health care provider during your annual  well check will depend on your age, overall health, lifestyle risk factors, and family history of disease. Counseling  Your health care provider may ask you questions about your: Alcohol use. Tobacco use. Drug use. Emotional well-being. Home and relationship well-being. Sexual activity. Eating habits. History of falls. Memory and ability to understand (cognition). Work and work Statistician. Reproductive health. Screening  You may have the following tests or measurements: Height, weight, and BMI. Blood pressure. Lipid and cholesterol levels. These may be checked every 5 years, or more frequently if you are over 95 years old. Skin check. Lung cancer screening. You may have this screening every year starting at age 84 if you have a 30-pack-year history of smoking and currently smoke or have quit within the past 15 years. Fecal occult blood test (FOBT) of the stool. You may have this test every year starting at age 84. Flexible sigmoidoscopy or colonoscopy. You may have a sigmoidoscopy every 5 years or a colonoscopy every 10 years starting at age 84. Hepatitis C blood test. Hepatitis B blood test. Sexually transmitted disease (STD) testing. Diabetes screening. This is done by checking your blood sugar (glucose) after you have not eaten for a while (fasting). You may have this done every 1-3 years. Bone density scan. This is done to screen for osteoporosis. You may have this done starting at age 84. Mammogram. This may be done every 1-2 years. Talk to your health care provider about how often you should have regular mammograms. Talk with your health care provider about your test results, treatment options, and if necessary, the need for more tests. Vaccines  Your health care provider may recommend certain vaccines, such as: Influenza vaccine. This is recommended every year. Tetanus, diphtheria, and acellular pertussis (Tdap, Td)  vaccine. You may need a Td booster every 10 years. Zoster  vaccine. You may need this after age 68. Pneumococcal 13-valent conjugate (PCV13) vaccine. One dose is recommended after age 84. Pneumococcal polysaccharide (PPSV23) vaccine. One dose is recommended after age 84. Talk to your health care provider about which screenings and vaccines you need and how often you need them. This information is not intended to replace advice given to you by your health care provider. Make sure you discuss any questions you have with your health care provider. Document Released: 10/19/2015 Document Revised: 06/11/2016 Document Reviewed: 07/24/2015 Elsevier Interactive Patient Education  2017 [place] Prevention in the Home Falls can cause injuries. They can happen to people of all ages. There are many things you can do to make your home safe and to help prevent falls. What can I do on the outside of my home? Regularly fix the edges of walkways and driveways and fix any cracks. Remove anything that might make you trip as you walk through a door, such as a raised step or threshold. Trim any bushes or trees on the path to your home. Use bright outdoor lighting. Clear any walking paths of anything that might make someone trip, such as rocks or tools. Regularly check to see if handrails are loose or broken. Make sure that both sides of any steps have handrails. Any raised decks and porches should have guardrails on the edges. Have any leaves, snow, or ice cleared regularly. Use sand or salt on walking paths during winter. Clean up any spills in your garage right away. This includes oil or grease spills. What can I do in the bathroom? Use night lights. Install grab bars by the toilet and in the tub and shower. Do not use towel bars as grab bars. Use non-skid mats or decals in the tub or shower. If you need to sit down in the shower, use a plastic, non-slip stool. Keep the floor dry. Clean up any water that spills on the floor as soon as it happens. Remove  soap buildup in the tub or shower regularly. Attach bath mats securely with double-sided non-slip rug tape. Do not have throw rugs and other things on the floor that can make you trip. What can I do in the bedroom? Use night lights. Make sure that you have a light by your bed that is easy to reach. Do not use any sheets or blankets that are too big for your bed. They should not hang down onto the floor. Have a firm chair that has side arms. You can use this for support while you get dressed. Do not have throw rugs and other things on the floor that can make you trip. What can I do in the kitchen? Clean up any spills right away. Avoid walking on wet floors. Keep items that you use a lot in easy-to-reach places. If you need to reach something above you, use a strong step stool that has a grab bar. Keep electrical cords out of the way. Do not use floor polish or wax that makes floors slippery. If you must use wax, use non-skid floor wax. Do not have throw rugs and other things on the floor that can make you trip. What can I do with my stairs? Do not leave any items on the stairs. Make sure that there are handrails on both sides of the stairs and use them. Fix handrails that are broken or loose. Make sure that handrails are as long  as the stairways. Check any carpeting to make sure that it is firmly attached to the stairs. Fix any carpet that is loose or worn. Avoid having throw rugs at the top or bottom of the stairs. If you do have throw rugs, attach them to the floor with carpet tape. Make sure that you have a light switch at the top of the stairs and the bottom of the stairs. If you do not have them, ask someone to add them for you. What else can I do to help prevent falls? Wear shoes that: Do not have high heels. Have rubber bottoms. Are comfortable and fit you well. Are closed at the toe. Do not wear sandals. If you use a stepladder: Make sure that it is fully opened. Do not climb a  closed stepladder. Make sure that both sides of the stepladder are locked into place. Ask someone to hold it for you, if possible. Clearly mark and make sure that you can see: Any grab bars or handrails. First and last steps. Where the edge of each step is. Use tools that help you move around (mobility aids) if they are needed. These include: Canes. Walkers. Scooters. Crutches. Turn on the lights when you go into a dark area. Replace any light bulbs as soon as they burn out. Set up your furniture so you have a clear path. Avoid moving your furniture around. If any of your floors are uneven, fix them. If there are any pets around you, be aware of where they are. Review your medicines with your doctor. Some medicines can make you feel dizzy. This can increase your chance of falling. Ask your doctor what other things that you can do to help prevent falls. This information is not intended to replace advice given to you by your health care provider. Make sure you discuss any questions you have with your health care provider. Document Released: 07/19/2009 Document Revised: 02/28/2016 Document Reviewed: 10/27/2014 Elsevier Interactive Patient Education  2017 [employer].

## 2021-04-25 NOTE — Progress Notes (Signed)
Subjective:   Whitney Manning is a 86 y.o. female who presents for Medicare Annual (Subsequent) preventive examination.  Review of Systems: N/A      I connected with the patient today by telephone and verified that I am speaking with the correct person using two identifiers. Location patient: home Location nurse: work Persons participating in the telephone visit: patient, nurse.   I discussed the limitations, risks, security and privacy concerns of performing an evaluation and management service by telephone and the availability of in person appointments. I also discussed with the patient that there may be a patient responsible charge related to this service. The patient expressed understanding and verbally consented to this telephonic visit.        Cardiac Risk Factors include: advanced age (>57men, >72 women);hypertension;Other (see comment), Risk factor comments: hyperlipidemia     Objective:    Today's Vitals   There is no height or weight on file to calculate BMI.  Advanced Directives 04/25/2021  Does Patient Have a Medical Advance Directive? No  Would patient like information on creating a medical advance directive? No - Patient declined    Current Medications (verified) Outpatient Encounter Medications as of 04/25/2021  Medication Sig   aspirin 325 MG tablet Take 325 mg by mouth daily.     atorvastatin (LIPITOR) 20 MG tablet TAKE 1 TABLET BY MOUTH EVERY OTHER DAY   Cholecalciferol (VITAMIN D) 2000 UNITS CAPS Take 1 capsule (2,000 Units total) by mouth daily.   co-enzyme Q-10 30 MG capsule Take 100 mg by mouth every other day.    fish oil-omega-3 fatty acids 1000 MG capsule Take 2 g by mouth 2 (two) times daily.     metoprolol tartrate (LOPRESSOR) 25 MG tablet TAKE 1 TABLET BY MOUTH TWICE A DAY   triamcinolone cream (KENALOG) 0.1 % Apply 1 application topically 2 (two) times daily as needed. Don't use on the face.   [DISCONTINUED] atorvastatin (LIPITOR) 20 MG tablet 1  tab by mouth every other day.   [DISCONTINUED] metoprolol tartrate (LOPRESSOR) 25 MG tablet 1/2 tab by mouth twice a day   No facility-administered encounter medications on file as of 04/25/2021.    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)   CHOLECYSTECTOMY     COLONOSCOPY     LUNG LOBECTOMY  2016   at [facility], for pulmonary nodule   salivary gland surgery for cancer on the left parotid     Family History  Problem Relation Age of Onset   Stroke Mother    Heart disease Father    Breast cancer Neg Hx    Colon cancer Neg Hx    Social History   Socioeconomic History   Marital status: Married    Spouse name: Not on file   Number of children: Not on file   Years of education: Not on file   Highest education level: Not on file  Occupational History   Not on file  Tobacco Use   Smoking status: Never   Smokeless tobacco: Never  Substance and Sexual Activity   Alcohol use: Yes    Comment: rare   Drug use: No   Sexual activity: Yes  Other Topics Concern   Not on file  Social History Narrative   1 older brother alive, 1 died of dementia.     First husband died of colon cancer.  Remarried 1998.    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, then at a [employer] Strain: Low Risk    Difficulty of Paying Living Expenses: Not hard at [employer] Insecurity: No Food Insecurity   Worried About Charity fundraiser in the Last Year: Never true   Arboriculturist in the Last Year: Never true  Transportation Needs: No  Data processing manager (Medical): No   Lack of Transportation (Non-Medical): No  Physical Activity: Sufficiently Active   Days of Exercise per Week: 7 days   Minutes of Exercise per Session: 30 min  Stress: No Stress Concern Present   Feeling of Stress : Not at all  Social Connections: Not on file    Tobacco Counseling Counseling given: Not Answered   Clinical Intake:  Pre-visit preparation completed: Yes  Pain : No/denies pain     Nutritional Risks: None Diabetes: No  How often do you need to have someone help you when you read instructions, pamphlets, or other written materials from your doctor or pharmacy?: 1 - Never  Diabetic: No Nutrition Risk Assessment:  Has the patient had any N/V/D within the last 2 months?  No  Does the patient have any non-healing wounds?  No  Has the patient had any unintentional weight loss or weight gain?  No   Diabetes:  Is the patient diabetic?  No  If diabetic, was a CBG obtained today?   N/A Did the patient bring in their glucometer from home?   N/A How often do you monitor your CBG's? N/A.   Financial Strains and Diabetes Management:  Are you having any financial strains with the device, your supplies or your medication?  N/A .  Does the patient want to be seen by Chronic Care Management for management of their diabetes?   N/A Would the patient like to be referred to a Nutritionist or for Diabetic Management?   N/A  Interpreter Needed?: No  Information entered by :: CJohnson, RN   Activities of Daily Living In your present state of health, do you have any difficulty performing the following activities: 04/25/2021  Hearing? Y  Comment wears hearing aids  Vision? N  Difficulty concentrating or making decisions? N  Walking or climbing stairs? N  Dressing or bathing? N  Doing errands, shopping? N  Preparing Food and eating ? N  Using the Toilet? N  In the past six months, have you accidently leaked  urine? N  Do you have problems with loss of bowel control? N  Managing your Medications? N  Managing your Finances? N  Housekeeping or managing your Housekeeping? N  Some recent data might be hidden    Patient Care Team: Tonia Ghent, MD as PCP - General (Family Medicine)  Indicate any recent Medical Services you may have received from other than [facility] providers in the past year (date may be approximate).     Assessment:   This is a routine wellness examination for Whitney Manning.  Hearing/Vision screen Vision Screening - Comments:: Patient gets annual eye  exams   Dietary issues and exercise activities discussed: Current Exercise Habits: Home exercise routine, Type of exercise: walking, Time (Minutes): 30, Frequency (Times/Week): 7, Weekly Exercise (Minutes/Week): 210, Intensity: Moderate, Exercise limited by: None identified   Goals Addressed             This Visit's Progress    Patient Stated       04/25/2021, I will continue to walk daily for 30 minutes.        Depression Screen PHQ 2/9 Scores 04/25/2021 04/19/2020 04/18/2019 04/09/2018 04/06/2017 04/04/2016 09/21/2013  PHQ - 2 Score 0 0 0 0 0 0 0  PHQ- 9 Score 0 - - - - - -    Fall Risk Fall Risk  04/25/2021 04/19/2020 04/18/2019 04/09/2018 04/06/2017  Falls in the past year? 0 0 0 No No  Number falls in past yr: 0 0 - - -  Injury with Fall? 0 0 - - -  Risk for fall due to : Medication side effect - - - -  Follow up Falls evaluation completed;Falls prevention discussed - - - -    FALL RISK PREVENTION PERTAINING TO THE HOME:  Any stairs in or around the home? Yes  If so, are there any without handrails? No  Home free of loose throw rugs in walkways, pet beds, electrical cords, etc? Yes  Adequate lighting in your home to reduce risk of falls? Yes   ASSISTIVE DEVICES UTILIZED TO PREVENT FALLS:  Life alert? No  Use of a cane, walker or w/c? No  Grab bars in the bathroom? No  Shower chair or bench in shower? No  Elevated  toilet seat or a handicapped toilet? No   TIMED UP AND GO:  Was the test performed?  N/A telephone visit .    Cognitive Function: MMSE - Mini Mental State Exam 04/25/2021  Orientation to time 5  Orientation to Place 5  Registration 3  Attention/ Calculation 5  Recall 3  Language- repeat 1       Mini Cog  Mini-Cog screen was completed. Maximum score is 22. A value of 0 denotes this part of the MMSE was not completed or the patient failed this part of the Mini-Cog screening.  Immunizations Immunization History  Administered Date(s) Administered   Influenza Split 07/16/2011   Influenza Whole 10/06/2001, 07/04/2009   Influenza, High Dose Seasonal PF 07/01/2018, 07/06/2019   Influenza,inj,Quad PF,6+ Mos 09/21/2013, 08/03/2015, 07/16/2017   Influenza-Unspecified 07/16/2011, 08/07/2016, 07/01/2018   Moderna Sars-Covid-2 Vaccination 10/19/2019, 11/16/2019   PFIZER(Purple Top)SARS-COV-2 Vaccination 09/07/2020   Pneumococcal Conjugate-13 04/03/2015   Pneumococcal Polysaccharide-23 07/04/2009   Td 10/06/2001, 09/21/2013   Zoster Recombinat (Shingrix) 11/26/2018, 01/21/2019   Zoster, Live 03/03/2007    TDAP status: Up to date  Flu Vaccine status: due Fall 2022  Pneumococcal vaccine status: Up to date  Covid-19 vaccine status: Completed 3 vaccines  Qualifies for Shingles Vaccine? Yes   Zostavax completed Yes   Shingrix Completed?: Yes  Screening Tests Health Maintenance  Topic Date Due   COVID-19 Vaccine (4 - Booster) 12/06/2020   INFLUENZA VACCINE  05/06/2021   TETANUS/TDAP  09/22/2023   DEXA SCAN  Completed   PNA vac Low Risk Adult  Completed   Zoster Vaccines- Shingrix  Completed   HPV VACCINES  Aged Out    Health Maintenance  Health Maintenance Due  Topic Date Due   COVID-19 Vaccine (4 - Booster) 12/06/2020    Colorectal cancer screening: No longer required.   Mammogram status: scheduled  05/14/2021  Bone Density status: declined  Lung Cancer Screening:  (Low Dose CT Chest recommended if Age 80-80 years, 30 pack-year currently smoking OR have quit w/in 15years.) does not qualify.    Additional Screening:  Hepatitis C Screening: does not qualify; Completed N/A  Vision Screening: Recommended annual ophthalmology exams for early detection of glaucoma and other disorders of the eye. Is the patient up to date with their annual eye exam?  Yes  Who is the provider or what is the name of the office in which the patient attends annual eye exams? Dr. Prudencio Burly If pt is not established with a provider, would they like to be referred to a provider to establish care? No .   Dental Screening: Recommended annual dental exams for proper oral hygiene  Community Resource Referral / Chronic Care Management: CRR required this visit?  No   CCM required this visit?  No      Plan:     I have personally reviewed and noted the following in the patient's chart:   Medical and social history Use of alcohol, tobacco or illicit drugs  Current medications and supplements including opioid prescriptions.  Functional ability and status Nutritional status Physical activity Advanced directives List of other physicians Hospitalizations, surgeries, and ER visits in previous 12 months Vitals Screenings to include cognitive, depression, and falls Referrals and appointments  In addition, I have reviewed and discussed with patient certain preventive protocols, quality metrics, and best practice recommendations. A written personalized care plan for preventive services as well as general preventive health recommendations were provided to patient.   Due to this being a telephonic visit, the after visit summary with patients personalized plan was offered to patient via office or my-chart. Patient preferred to pick up at office at next visit or via mychart.   Andrez Grime, LPN   [id number]

## 2021-04-29 ENCOUNTER — Ambulatory Visit (INDEPENDENT_AMBULATORY_CARE_PROVIDER_SITE_OTHER): Payer: Medicare Other | Admitting: Family Medicine

## 2021-04-29 ENCOUNTER — Encounter: Payer: Self-pay | Admitting: Family Medicine

## 2021-04-29 ENCOUNTER — Other Ambulatory Visit: Payer: Self-pay

## 2021-04-29 VITALS — BP 136/84 | HR 73 | Temp 97.3°F | Ht 63.0 in | Wt 134.0 lb

## 2021-04-29 DIAGNOSIS — Z85819 Personal history of malignant neoplasm of unspecified site of lip, oral cavity, and pharynx: Secondary | ICD-10-CM

## 2021-04-29 DIAGNOSIS — M8580 Other specified disorders of bone density and structure, unspecified site: Secondary | ICD-10-CM

## 2021-04-29 DIAGNOSIS — E785 Hyperlipidemia, unspecified: Secondary | ICD-10-CM

## 2021-04-29 DIAGNOSIS — Z85118 Personal history of other malignant neoplasm of bronchus and lung: Secondary | ICD-10-CM

## 2021-04-29 DIAGNOSIS — Z7189 Other specified counseling: Secondary | ICD-10-CM

## 2021-04-29 DIAGNOSIS — I10 Essential (primary) hypertension: Secondary | ICD-10-CM | POA: Diagnosis not present

## 2021-04-29 DIAGNOSIS — Z0000 Encounter for general adult medical examination without abnormal findings: Secondary | ICD-10-CM

## 2021-04-29 DIAGNOSIS — Z952 Presence of prosthetic heart valve: Secondary | ICD-10-CM

## 2021-04-29 DIAGNOSIS — Z8509 Personal history of malignant neoplasm of other digestive organs: Secondary | ICD-10-CM

## 2021-04-29 LAB — LIPID PANEL
Cholesterol: 190 mg/dL (ref 0–200)
HDL: 69.7 mg/dL (ref >39.00)
LDL Cholesterol: 103 mg/dL — ABNORMAL HIGH (ref 0–99)
NonHDL: 120.18
Total CHOL/HDL Ratio: 3
Triglycerides: 87 mg/dL (ref 0.0–149.0)
VLDL: 17.4 mg/dL (ref 0.0–40.0)

## 2021-04-29 LAB — VITAMIN D 25 HYDROXY (VIT D DEFICIENCY, FRACTURES): VITD: 61.66 ng/mL (ref 30.00–100.00)

## 2021-04-29 NOTE — Assessment & Plan Note (Signed)
Flu yearly Shingles prev done.   PNA UTD Tetanus 2014 covid vaccine 2021, Moderna Colonoscopy declined due to age, d/w pt.  Breast cancer screening d/w pt.   Advance directive- husband designated if patient were incapacitated.  History of osteopenia noted. DXA declined, unable to tolerate fosamax, vit D pending, d/w pt. see notes on labs.

## 2021-04-29 NOTE — Assessment & Plan Note (Signed)
  History of osteopenia noted. DXA declined, unable to tolerate fosamax, vit D pending, d/w pt. see notes on labs.

## 2021-04-29 NOTE — Assessment & Plan Note (Signed)
Hold metoprolol and see if itching improves.  No rash on exam. Recent Cr wnl.  D/w pt.  Continue diet and exercise.  Lipids pending.

## 2021-04-29 NOTE — Assessment & Plan Note (Signed)
No new sx, per outside clinic.

## 2021-04-29 NOTE — Assessment & Plan Note (Signed)
See HTN discussion.  She has routine cardiac f/u.  No murmur noted on exam.

## 2021-04-29 NOTE — Assessment & Plan Note (Signed)
Advance directive- husband designated if patient were incapacitated.

## 2021-04-29 NOTE — Patient Instructions (Addendum)
Stop metoprolol for about 1 week and then update me about the itching, your BP and pulse.   Take care.  Glad to see you. Go to the lab on the way out.   If you have mychart we'll likely use that to update you.

## 2021-04-29 NOTE — Progress Notes (Signed)
This visit occurred during the SARS-CoV-2 public health emergency.  Safety protocols were in place, including screening questions prior to the visit, additional usage of staff PPE, and extensive cleaning of exam room while observing appropriate contact time as indicated for disinfecting solutions.  Flu yearly Shingles prev done.   PNA UTD Tetanus 2014 covid vaccine 2021, Moderna Colonoscopy declined due to age, d/w pt.  Breast cancer screening d/w pt.   Advance directive- husband designated if patient were incapacitated.  History of osteopenia noted. DXA declined, unable to tolerate fosamax, vit D pending, d/w pt. see notes on labs.    Elevated Cholesterol: Using medications without problems: yes Muscle aches: no Diet compliance: yes Exercise: walking/yardwork.    Hypertension & h/o valve replacement  Using medication without problems or lightheadedness: see below.   Chest pain with exertion:yes Edema:no Short of breath:no She had itching attributed by patient to metoprolol.  She itches at night, on the arms and hands.  Going on for years.  No rash.    Onc f/u per outside clinic, d/w pt.  She is going to have f/u re: pulmonary nodule.   PMH and SH reviewed  Meds, vitals, and allergies reviewed.   ROS: Per HPI unless specifically indicated in ROS section   GEN: nad, alert and oriented HEENT: ncat NECK: supple w/o LA CV: rrr. PULM: ctab, no inc wob ABD: soft, +bs EXT: no edema SKIN: no acute rash

## 2021-04-29 NOTE — Assessment & Plan Note (Signed)
Continue atorvastatin.  See notes on labs. Recent LFTs wnl.  D/w pt.  Continue diet and exercise.

## 2021-04-29 NOTE — Assessment & Plan Note (Signed)
Onc f/u per outside clinic, d/w pt.  She is going to have f/u re: pulmonary nodule. I'll defer.  She agrees.

## 2021-05-14 ENCOUNTER — Ambulatory Visit
Admission: RE | Admit: 2021-05-14 | Discharge: 2021-05-14 | Disposition: A | Discharge status: Home / Self Care | Payer: Medicare Other | Source: Ambulatory Visit | Attending: Family Medicine | Admitting: Family Medicine

## 2021-05-14 ENCOUNTER — Other Ambulatory Visit: Payer: Self-pay

## 2021-05-14 DIAGNOSIS — Z1231 Encounter for screening mammogram for malignant neoplasm of breast: Secondary | ICD-10-CM

## 2021-06-03 ENCOUNTER — Ambulatory Visit: Payer: Medicare Other | Admitting: Family Medicine

## 2021-06-03 ENCOUNTER — Encounter: Payer: Self-pay | Admitting: Family Medicine

## 2021-06-03 ENCOUNTER — Other Ambulatory Visit: Payer: Self-pay

## 2021-06-03 DIAGNOSIS — Z8679 Personal history of other diseases of the circulatory system: Secondary | ICD-10-CM

## 2021-06-03 DIAGNOSIS — L299 Pruritus, unspecified: Secondary | ICD-10-CM | POA: Diagnosis not present

## 2021-06-03 MED ORDER — TRIAMCINOLONE ACETONIDE 0.1 % EX CREA: 1.0000 "application " | TOPICAL_CREAM | Freq: Two times a day (BID) | CUTANEOUS | 1 refills | Status: DC | PRN

## 2021-06-03 NOTE — Patient Instructions (Addendum)
The next time you have a chance, then please check your BP cuff against another cuff to make sure it is accurate.   Use triamcinolone cream if needed for itching on the hand.  Take care.  Glad to see you.

## 2021-06-03 NOTE — Progress Notes (Signed)
This visit occurred during the SARS-CoV-2 public health emergency.  Safety protocols were in place, including screening questions prior to the visit, additional usage of staff PPE, and extensive cleaning of exam room while observing appropriate contact time as indicated for disinfecting solutions.  She had some lower BP readings at home with SBP <100.  Not lightheaded usually but occ happens with suddenly getting up but not every time.  No syncope.  Stopped metoprolol and most of her itching is better.  Some occ L 1st and 5th finger itching.  No rash.    Meds, vitals, and allergies reviewed.   ROS: Per HPI unless specifically indicated in ROS section   Nad Ncat Neck supple, no LA Normal radial pulses.  Rrr Ctab Skin wnl, no rash on the hands.  Skin well perfused.

## 2021-06-05 DIAGNOSIS — L299 Pruritus, unspecified: Secondary | ICD-10-CM | POA: Insufficient documentation

## 2021-06-05 NOTE — Assessment & Plan Note (Signed)
No rash seen.  Discussed options She could use triamcinolone cream if needed for itching on the hand.  Update me as needed.

## 2021-06-05 NOTE — Assessment & Plan Note (Addendum)
History of, discussed options.  It looks like metoprolol was causing the itching. I asked her to please check her BP cuff against another cuff to make sure it is accurate.   Would not restart metoprolol in the meantime.

## 2021-08-20 IMAGING — MG DIGITAL SCREENING BILAT W/ TOMO W/ CAD
8 series · 8 of 24 positions shown · non-contrast
Comparison: Previous exams.

CLINICAL DATA: Screening.

EXAM:
DIGITAL SCREENING BILATERAL MAMMOGRAM WITH TOMO AND CAD

[L MLO synth-2D]
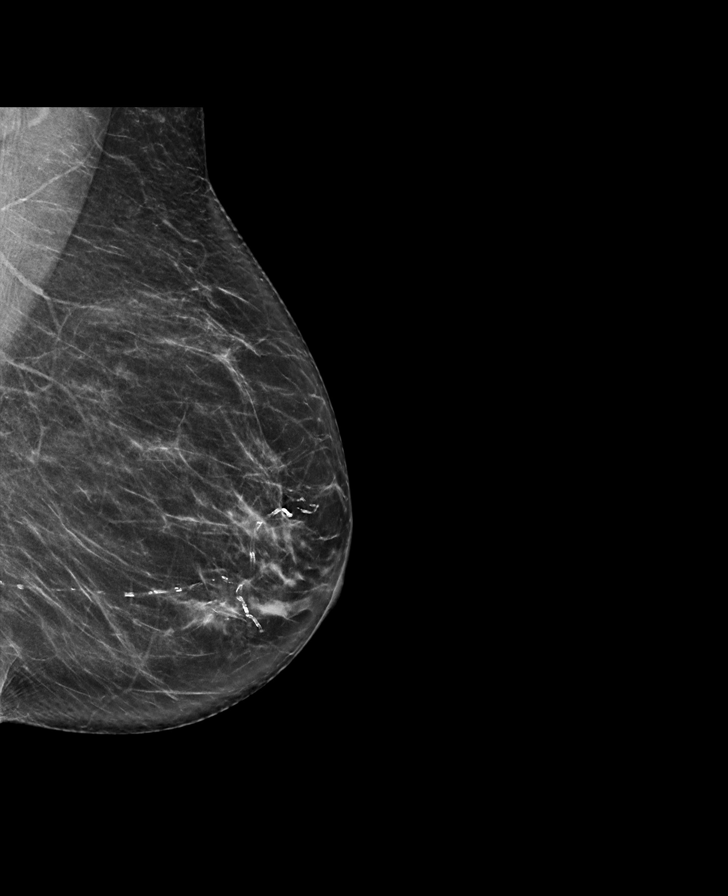

[L CC synth-2D]
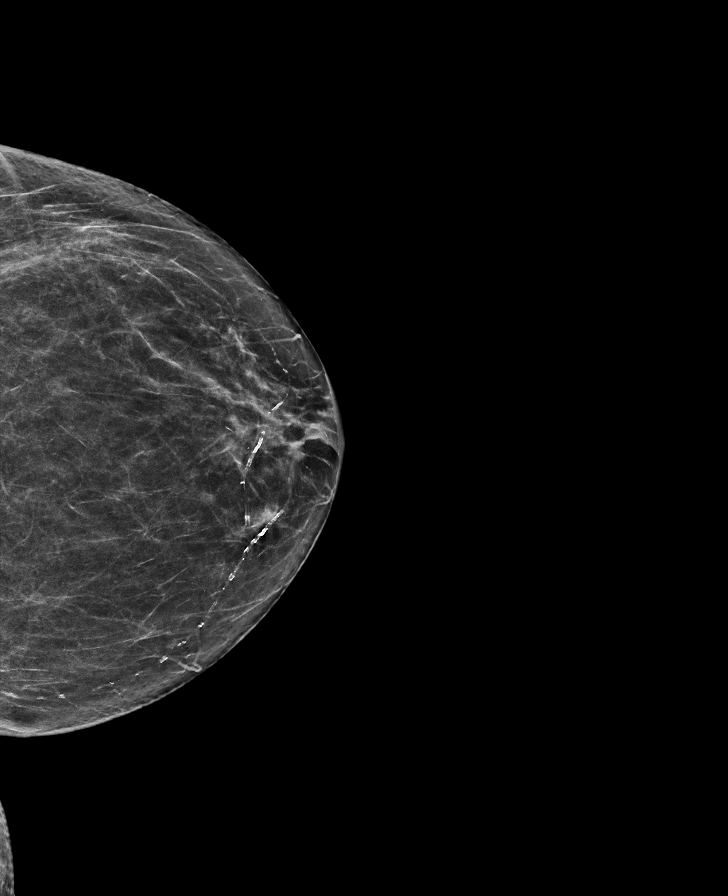

[R MLO synth-2D]
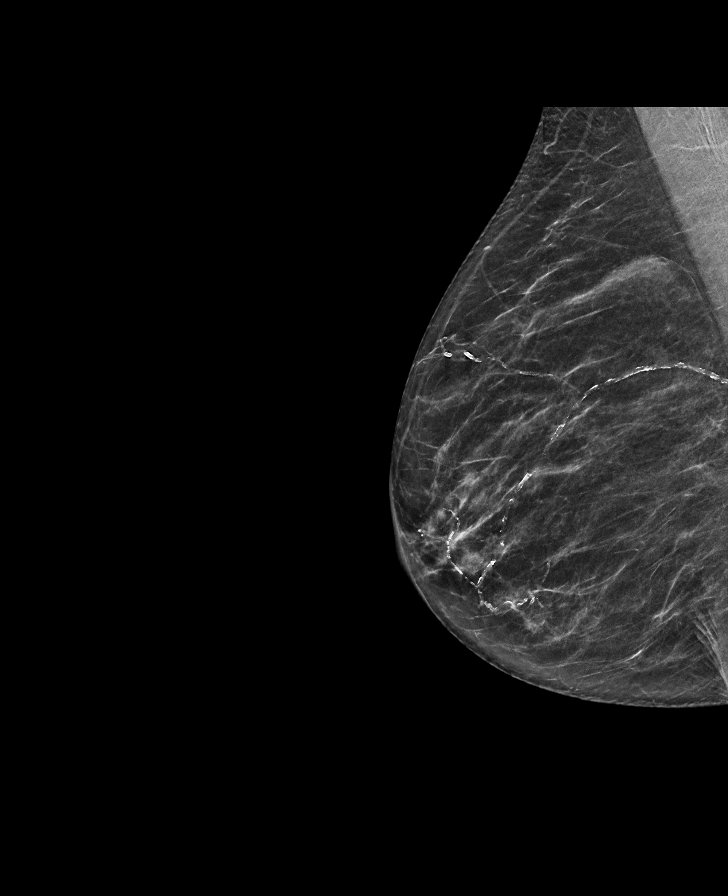

[R CC synth-2D]
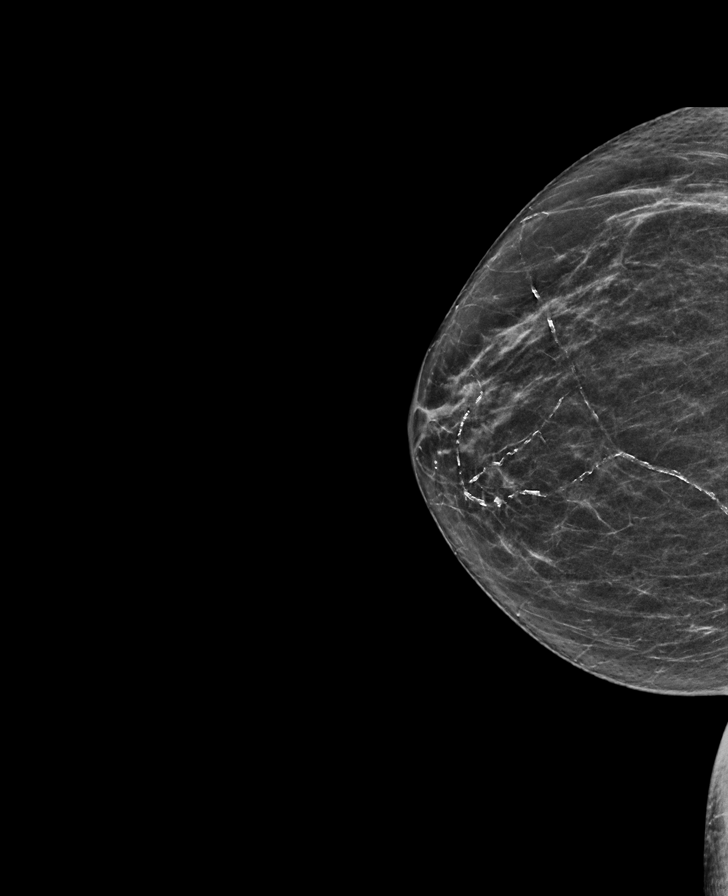

[R CC tomo · tomo slice 29/58.0]
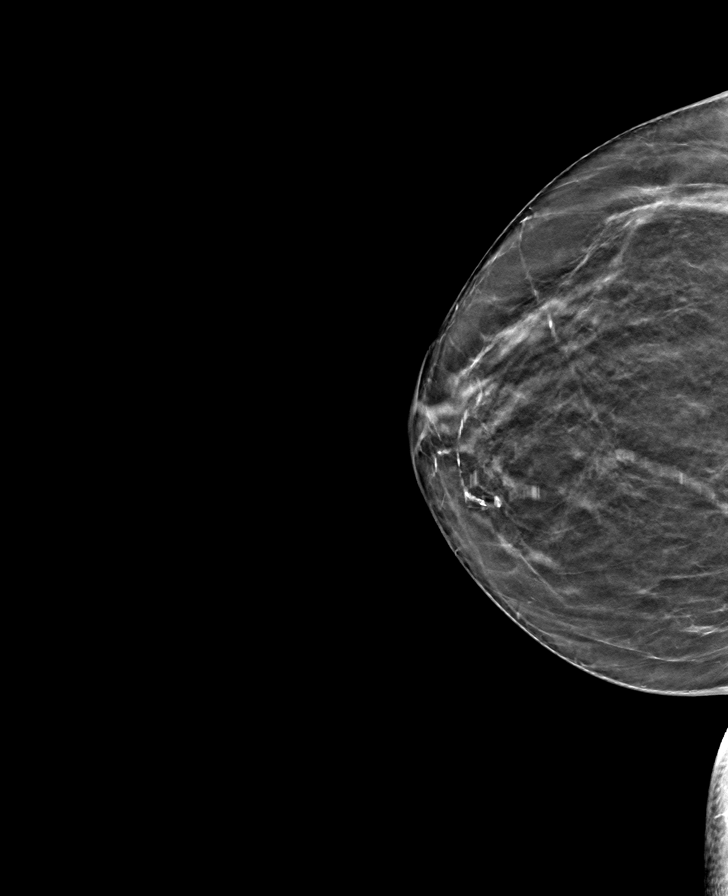

[L CC tomo · tomo slice 33/64.0]
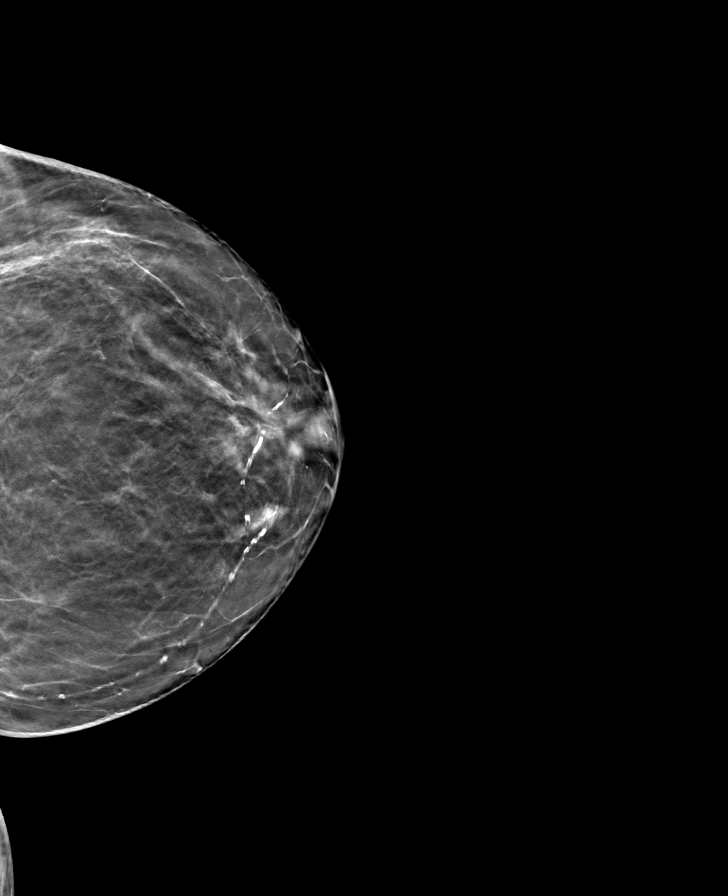

[L MLO tomo · tomo slice 35/68.0]
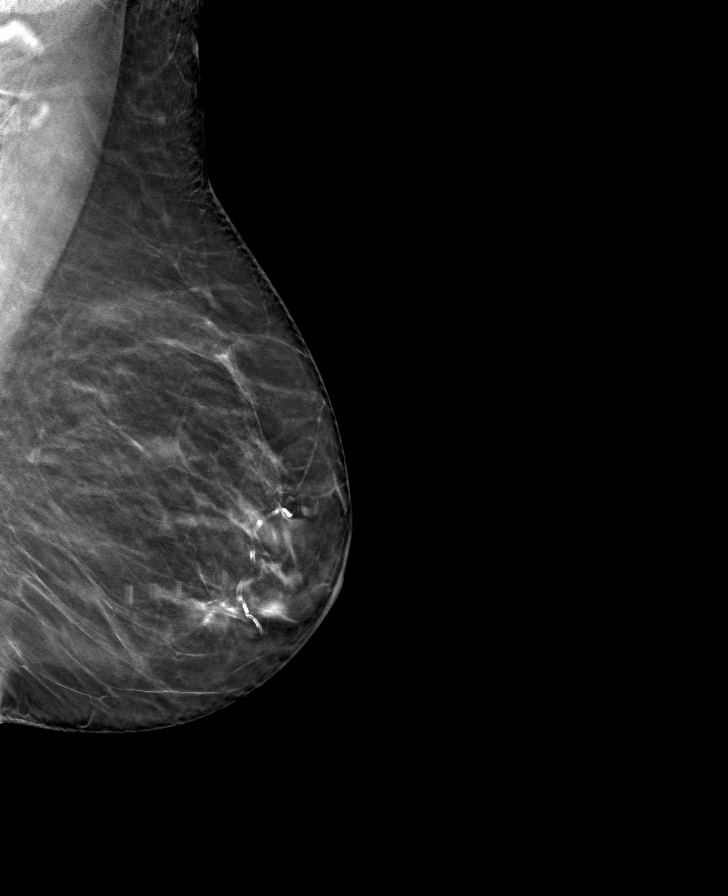

[R MLO tomo · tomo slice 31/62.0]
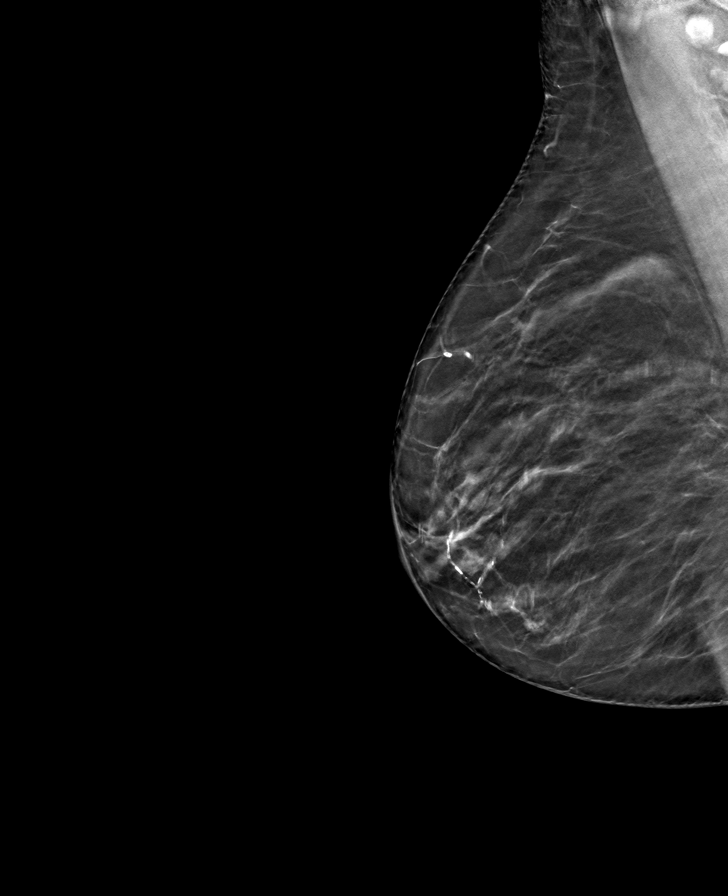

[8 of 24 positions shown; findings below may reference images not displayed]

ACR Breast Density Category b: There are scattered areas of
fibroglandular density.
FINDINGS: In the left breast, possible distortion warrants further evaluation.
In the right breast, no findings suspicious for malignancy. Images
were processed with CAD.
IMPRESSION: Further evaluation is suggested for possible distortion in the left
breast.

RECOMMENDATION:
Diagnostic mammogram and possibly ultrasound of the left breast.
(Code:[id number]-I-FFF)

The patient will be contacted regarding the findings, and additional
imaging will be scheduled.

BI-RADS CATEGORY  0: Incomplete. Need additional imaging evaluation
and/or prior mammograms for comparison.

## 2021-10-31 ENCOUNTER — Telehealth: Payer: Self-pay | Admitting: Family Medicine

## 2021-10-31 DIAGNOSIS — N644 Mastodynia: Secondary | ICD-10-CM

## 2021-10-31 NOTE — Telephone Encounter (Signed)
R breast pain/heaviness/soreness.  Going on intermittently for about a few months. Some days w/o sx.  Some days with pain.  Not exertional.  She tried to get a f/u mammogram but needed an order/note from PCP.  Inferior and lateral portion of the breast.  No sx currently.  No skin change or discharge.  No lump or mass.  Exam deferred today, as no symptoms currently.    D/w pt, referral placed.  She'll let me know if she has trouble getting scheduled.  She agrees.

## 2021-11-02 ENCOUNTER — Other Ambulatory Visit: Payer: Self-pay | Admitting: Family Medicine

## 2021-11-02 DIAGNOSIS — N644 Mastodynia: Secondary | ICD-10-CM

## 2021-12-02 ENCOUNTER — Other Ambulatory Visit: Payer: Self-pay

## 2021-12-02 ENCOUNTER — Other Ambulatory Visit: Payer: Self-pay | Admitting: Family Medicine

## 2021-12-02 ENCOUNTER — Ambulatory Visit
Admission: RE | Admit: 2021-12-02 | Discharge: 2021-12-02 | Disposition: A | Discharge status: Home / Self Care | Payer: Medicare Other | Source: Ambulatory Visit | Attending: Family Medicine | Admitting: Family Medicine

## 2021-12-02 DIAGNOSIS — N644 Mastodynia: Secondary | ICD-10-CM

## 2022-04-11 ENCOUNTER — Other Ambulatory Visit: Payer: Self-pay | Admitting: Family Medicine

## 2022-04-11 DIAGNOSIS — Z1231 Encounter for screening mammogram for malignant neoplasm of breast: Secondary | ICD-10-CM

## 2022-04-30 ENCOUNTER — Ambulatory Visit (INDEPENDENT_AMBULATORY_CARE_PROVIDER_SITE_OTHER): Payer: Medicare Other

## 2022-04-30 VITALS — Wt 133.0 lb

## 2022-04-30 DIAGNOSIS — Z0000 Encounter for general adult medical examination without abnormal findings: Secondary | ICD-10-CM | POA: Diagnosis not present

## 2022-04-30 NOTE — Patient Instructions (Signed)
Whitney Manning , Thank you for taking time to come for your Medicare Wellness Visit. I appreciate your ongoing commitment to your health goals. Please review the following plan we discussed and let me know if I can assist you in the future.   Screening recommendations/referrals: Colonoscopy: aged out Mammogram: 12/02/21, has appointment next month Bone Density: 04/17/15, declined referral Recommended yearly ophthalmology/optometry visit for glaucoma screening and checkup Recommended yearly dental visit for hygiene and checkup  Vaccinations: Influenza vaccine: 07/17/21 Pneumococcal vaccine: 04/03/15 Tdap vaccine: 09/21/13 Shingles vaccine: Zostavax 03/03/07   Shingrix 11/26/18, 01/21/19 Covid-19:10/19/19, 11/16/19, 09/07/20, 09/04/21  Advanced directives: no  Conditions/risks identified: none  Next appointment: Follow up in one year for your annual wellness visit 05/04/23 @ 11:15 am by phone   Preventive Care 65 Years and Older, Female Preventive care refers to lifestyle choices and visits with your health care provider that can promote health and wellness. What does preventive care include? A yearly physical exam. This is also called an annual well check. Dental exams once or twice a year. Routine eye exams. Ask your health care provider how often you should have your eyes checked. Personal lifestyle choices, including: Daily care of your teeth and gums. Regular physical activity. Eating a healthy diet. Avoiding tobacco and drug use. Limiting alcohol use. Practicing safe sex. Taking low-dose aspirin every day. Taking vitamin and mineral supplements as recommended by your health care provider. What happens during an annual well check? The services and screenings done by your health care provider during your annual well check will depend on your age, overall health, lifestyle risk factors, and family history of disease. Counseling  Your health care provider may ask you questions about  your: Alcohol use. Tobacco use. Drug use. Emotional well-being. Home and relationship well-being. Sexual activity. Eating habits. History of falls. Memory and ability to understand (cognition). Work and work Statistician. Reproductive health. Screening  You may have the following tests or measurements: Height, weight, and BMI. Blood pressure. Lipid and cholesterol levels. These may be checked every 5 years, or more frequently if you are over 57 years old. Skin check. Lung cancer screening. You may have this screening every year starting at age 39 if you have a 30-pack-year history of smoking and currently smoke or have quit within the past 15 years. Fecal occult blood test (FOBT) of the stool. You may have this test every year starting at age 63. Flexible sigmoidoscopy or colonoscopy. You may have a sigmoidoscopy every 5 years or a colonoscopy every 10 years starting at age 35. Hepatitis C blood test. Hepatitis B blood test. Sexually transmitted disease (STD) testing. Diabetes screening. This is done by checking your blood sugar (glucose) after you have not eaten for a while (fasting). You may have this done every 1-3 years. Bone density scan. This is done to screen for osteoporosis. You may have this done starting at age 13. Mammogram. This may be done every 1-2 years. Talk to your health care provider about how often you should have regular mammograms. Talk with your health care provider about your test results, treatment options, and if necessary, the need for more tests. Vaccines  Your health care provider may recommend certain vaccines, such as: Influenza vaccine. This is recommended every year. Tetanus, diphtheria, and acellular pertussis (Tdap, Td) vaccine. You may need a Td booster every 10 years. Zoster vaccine. You may need this after age 24. Pneumococcal 13-valent conjugate (PCV13) vaccine. One dose is recommended after age 74. Pneumococcal polysaccharide (PPSV23) vaccine.  One dose is recommended after age 13. Talk to your health care provider about which screenings and vaccines you need and how often you need them. This information is not intended to replace advice given to you by your health care provider. Make sure you discuss any questions you have with your health care provider. Document Released: 10/19/2015 Document Revised: 06/11/2016 Document Reviewed: 07/24/2015 Elsevier Interactive Patient Education  2017 [place] Prevention in the Home Falls can cause injuries. They can happen to people of all ages. There are many things you can do to make your home safe and to help prevent falls. What can I do on the outside of my home? Regularly fix the edges of walkways and driveways and fix any cracks. Remove anything that might make you trip as you walk through a door, such as a raised step or threshold. Trim any bushes or trees on the path to your home. Use bright outdoor lighting. Clear any walking paths of anything that might make someone trip, such as rocks or tools. Regularly check to see if handrails are loose or broken. Make sure that both sides of any steps have handrails. Any raised decks and porches should have guardrails on the edges. Have any leaves, snow, or ice cleared regularly. Use sand or salt on walking paths during winter. Clean up any spills in your garage right away. This includes oil or grease spills. What can I do in the bathroom? Use night lights. Install grab bars by the toilet and in the tub and shower. Do not use towel bars as grab bars. Use non-skid mats or decals in the tub or shower. If you need to sit down in the shower, use a plastic, non-slip stool. Keep the floor dry. Clean up any water that spills on the floor as soon as it happens. Remove soap buildup in the tub or shower regularly. Attach bath mats securely with double-sided non-slip rug tape. Do not have throw rugs and other things on the floor that can make  you trip. What can I do in the bedroom? Use night lights. Make sure that you have a light by your bed that is easy to reach. Do not use any sheets or blankets that are too big for your bed. They should not hang down onto the floor. Have a firm chair that has side arms. You can use this for support while you get dressed. Do not have throw rugs and other things on the floor that can make you trip. What can I do in the kitchen? Clean up any spills right away. Avoid walking on wet floors. Keep items that you use a lot in easy-to-reach places. If you need to reach something above you, use a strong step stool that has a grab bar. Keep electrical cords out of the way. Do not use floor polish or wax that makes floors slippery. If you must use wax, use non-skid floor wax. Do not have throw rugs and other things on the floor that can make you trip. What can I do with my stairs? Do not leave any items on the stairs. Make sure that there are handrails on both sides of the stairs and use them. Fix handrails that are broken or loose. Make sure that handrails are as long as the stairways. Check any carpeting to make sure that it is firmly attached to the stairs. Fix any carpet that is loose or worn. Avoid having throw rugs at the top or bottom of the  stairs. If you do have throw rugs, attach them to the floor with carpet tape. Make sure that you have a light switch at the top of the stairs and the bottom of the stairs. If you do not have them, ask someone to add them for you. What else can I do to help prevent falls? Wear shoes that: Do not have high heels. Have rubber bottoms. Are comfortable and fit you well. Are closed at the toe. Do not wear sandals. If you use a stepladder: Make sure that it is fully opened. Do not climb a closed stepladder. Make sure that both sides of the stepladder are locked into place. Ask someone to hold it for you, if possible. Clearly mark and make sure that you can  see: Any grab bars or handrails. First and last steps. Where the edge of each step is. Use tools that help you move around (mobility aids) if they are needed. These include: Canes. Walkers. Scooters. Crutches. Turn on the lights when you go into a dark area. Replace any light bulbs as soon as they burn out. Set up your furniture so you have a clear path. Avoid moving your furniture around. If any of your floors are uneven, fix them. If there are any pets around you, be aware of where they are. Review your medicines with your doctor. Some medicines can make you feel dizzy. This can increase your chance of falling. Ask your doctor what other things that you can do to help prevent falls. This information is not intended to replace advice given to you by your health care provider. Make sure you discuss any questions you have with your health care provider. Document Released: 07/19/2009 Document Revised: 02/28/2016 Document Reviewed: 10/27/2014 Elsevier Interactive Patient Education  2017 [employer].

## 2022-04-30 NOTE — Progress Notes (Signed)
Virtual Visit via Telephone Note  I connected with  Whitney Manning on 04/30/22 at 11:00 AM EDT by telephone and verified that I am speaking with the correct person using two identifiers.  Location: Patient: home Provider: [place] Persons participating in the virtual visit: [place]   I discussed the limitations, risks, security and privacy concerns of performing an evaluation and management service by telephone and the availability of in person appointments. The patient expressed understanding and agreed to proceed.  Interactive audio and video telecommunications were attempted between this nurse and patient, however failed, due to patient having technical difficulties OR patient did not have access to video capability.  We continued and completed visit with audio only.  Some vital signs may be absent or patient reported.   Dionisio David, LPN  Subjective:   Whitney Manning is a 87 y.o. female who presents for Medicare Annual (Subsequent) preventive examination.  Review of Systems     Cardiac Risk Factors include: advanced age (>27men, >64 women);hypertension     Objective:    There were no vitals filed for this visit. There is no height or weight on file to calculate BMI.     04/30/2022   11:01 AM 04/25/2021    3:08 PM  Advanced Directives  Does Patient Have a Medical Advance Directive? No No  Would patient like information on creating a medical advance directive? No - Patient declined No - Patient declined    Current Medications (verified) Outpatient Encounter Medications as of 04/30/2022  Medication Sig   aspirin 325 MG tablet Take 325 mg by mouth daily.     atorvastatin (LIPITOR) 20 MG tablet TAKE 1 TABLET BY MOUTH EVERY OTHER DAY   Cholecalciferol (VITAMIN D) 2000 UNITS CAPS Take 1 capsule (2,000 Units total) by mouth daily.   co-enzyme Q-10 30 MG capsule Take 100 mg by mouth every other day.    fish oil-omega-3 fatty acids 1000 MG  capsule Take 2 g by mouth 2 (two) times daily.     [DISCONTINUED] atorvastatin (LIPITOR) 20 MG tablet Take 1 tablet by mouth daily.   amoxicillin (AMOXIL) 500 MG capsule Take by mouth. (Patient not taking: Reported on 04/30/2022)   metoprolol tartrate (LOPRESSOR) 25 MG tablet Take by mouth. (Patient not taking: Reported on 04/30/2022)   metroNIDAZOLE (METROGEL) 0.75 % gel Apply topically 2 (two) times daily. (Patient not taking: Reported on 04/30/2022)   triamcinolone cream (KENALOG) 0.1 % Apply 1 application topically 2 (two) times daily as needed. For itching skin (Patient not taking: Reported on 04/30/2022)   triamcinolone cream (KENALOG) 0.1 % Apply topically. (Patient not taking: Reported on 04/30/2022)   [DISCONTINUED] co-enzyme Q-10 30 MG capsule Take by mouth.   No facility-administered encounter medications on file as of 04/30/2022.    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)   CHOLECYSTECTOMY     COLONOSCOPY     LUNG LOBECTOMY  2016   at [facility], for pulmonary nodule   salivary gland surgery  for cancer on the left parotid     Family History  Problem Relation Age of Onset   Stroke Mother    Heart disease Father    Breast cancer Neg Hx    Colon cancer Neg Hx    Social History   Socioeconomic History   Marital status: Married    Spouse name: Not on file   Number of children: Not on file   Years of education: Not on file   Highest education level: Not on file  Occupational History   Not on file  Tobacco Use   Smoking status: Never   Smokeless tobacco: Never   Substance and Sexual Activity   Alcohol use: Yes    Comment: rare   Drug use: No   Sexual activity: Yes  Other Topics Concern   Not on file  Social History Narrative   All siblings deceased   First husband died of colon cancer.  Remarried 1998.    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 [place] Strain: [place]  (04/30/2022)   Overall Financial Resource Strain (CARDIA)    Difficulty of Paying Living Expenses: Not hard at all  Food Insecurity: No Food Insecurity (04/30/2022)   Hunger Vital Sign    Worried About Running Out of Food in the Last Year: Never true    [place] in the Last Year: Never true  Transportation Needs: No Transportation Needs (04/30/2022)   PRAPARE - Hydrologist (Medical): No    Lack of Transportation (Non-Medical): No  Physical Activity: Sufficiently Active (04/30/2022)   Exercise Vital Sign    Days of Exercise per Week: 5 days    Minutes of Exercise per Session: 30 min  Stress: No Stress Concern Present (04/30/2022)   [place]    Feeling of Stress : Not at all  Social Connections: Moderately Integrated (04/30/2022)   Social Connection and Isolation Panel [NHANES]    Frequency of Communication with Friends and Family: More than three times a week    Frequency of Social Gatherings with Friends and Family: Once a week    Attends Religious Services: More than 4 times per year    Active Member of [employer] or Organizations: No    Attends Music therapist: Never    Marital Status: Married    Tobacco Counseling Counseling given: Not Answered   Clinical Intake:  Pre-visit preparation completed: Yes  Pain : No/denies pain     Nutritional Risks: None Diabetes: No  How often do you need to have someone help you when you read instructions, pamphlets, or other  written materials from your doctor or pharmacy?: 1 - Never  Diabetic?no  Interpreter Needed?: No  Information entered by :: Kirke Shaggy, LPN   Activities of Daily Living    04/30/2022   11:01 AM  In your present state of health, do you have any difficulty performing the following activities:  Hearing? 1  Vision? 0  Difficulty concentrating or making decisions? 0  Walking or climbing stairs? 0  Dressing or bathing? 0  Doing errands, shopping? 0  Preparing Food and eating ? N  Using the Toilet? N  In the past six months, have you accidently leaked urine? N  Do you have problems with loss of bowel control? N  Managing your Medications? N  Managing your Finances? N  Housekeeping or managing your Housekeeping? N    Patient Care Team: Tonia Ghent, MD as PCP - General (Family Medicine)  Indicate any recent Medical Services you may have received from other than [facility] providers in the past year (date may be approximate).     Assessment:   This is a routine wellness examination for Whitney Manning.  Hearing/Vision screen Hearing Screening - Comments:: Wears aids Vision Screening - Comments:: Readers- Dr.Lyles  Dietary issues and exercise activities discussed: Current Exercise Habits: Home exercise routine, Type of exercise: walking, Time (Minutes): 30, Frequency (Times/Week): 5, Weekly Exercise (Minutes/Week): 150, Intensity: Mild   Goals Addressed             This Visit's Progress    DIET - EAT MORE FRUITS AND VEGETABLES         Depression Screen    04/30/2022   10:59 AM 04/25/2021    3:09 PM 04/19/2020    8:30 AM 04/18/2019    9:51 AM 04/09/2018    8:55 AM 04/06/2017    9:02 AM 04/04/2016    9:58 AM  PHQ 2/9 Scores  PHQ - 2 Score 0 0 0 0 0 0 0  PHQ- 9 Score 0 0         Fall Risk    04/30/2022   11:01 AM 04/25/2021    3:08 PM 04/19/2020    8:30 AM 04/18/2019    9:51 AM 04/09/2018    8:55 AM  Fall Risk   Falls in the past year? 0 0 0 0 No  Number falls in past yr:  0 0 0    Injury with Fall? 0 0 0    Risk for fall due to : No Fall Risks Medication side effect     Follow up Falls evaluation completed Falls evaluation completed;Falls prevention discussed       FALL RISK PREVENTION PERTAINING TO THE HOME:  Any stairs in or around the home? No  If so, are there any without handrails? No  Home free of loose throw rugs in walkways, pet beds, electrical cords, etc? Yes  Adequate lighting in your home to reduce risk of falls? Yes   ASSISTIVE DEVICES UTILIZED TO PREVENT FALLS:  Life alert? No  Use of a cane, walker or w/c? No  Grab bars in the bathroom? Yes  Shower chair or bench in shower? Yes  Elevated toilet seat or a handicapped toilet? Yes    Cognitive Function:    04/25/2021    3:13 PM  MMSE - Mini Mental State Exam  Orientation to time 5  Orientation to Place 5  Registration 3  Attention/ Calculation 5  Recall 3  Language- repeat 1        04/30/2022   11:02 AM  6CIT Screen  What Year? 0 points  What month? 0 points  What time? 0 points  Count back from 20 0 points  Months in reverse 0 points  Repeat phrase 0 points  Total Score 0 points    Immunizations Immunization History  Administered Date(s) Administered   Influenza Split 07/16/2011   Influenza Whole 10/06/2001, 07/04/2009   Influenza, High Dose Seasonal PF 07/01/2018, 07/06/2019, 07/17/2021   Influenza,inj,Quad PF,6+ Mos 09/21/2013, 08/03/2015, 07/16/2017   Influenza-Unspecified 07/16/2011, 08/07/2016, 07/01/2018   Moderna Sars-Covid-2 Vaccination 10/19/2019, 11/16/2019   PFIZER(Purple Top)SARS-COV-2 Vaccination 09/07/2020   Pfizer Covid-19 Vaccine Bivalent Booster 31yrs & up 09/04/2021   Pneumococcal Conjugate-13 04/03/2015   Pneumococcal Polysaccharide-23 07/04/2009  Td 10/06/2001, 09/21/2013   Zoster Recombinat (Shingrix) 11/26/2018, 01/21/2019   Zoster, Live 03/03/2007    TDAP status: Up to date  Flu Vaccine status: Up to date  Pneumococcal vaccine  status: Up to date  Covid-19 vaccine status: Completed vaccines  Qualifies for Shingles Vaccine? Yes   Zostavax completed Yes   Shingrix Completed?: Yes  Screening Tests Health Maintenance  Topic Date Due   INFLUENZA VACCINE  05/06/2022   TETANUS/TDAP  09/22/2023   Pneumonia Vaccine 41+ Years old  Completed   DEXA SCAN  Completed   COVID-19 Vaccine  Completed   Zoster Vaccines- Shingrix  Completed   HPV VACCINES  Aged Out    Health Maintenance  There are no preventive care reminders to display for this patient.  Colorectal cancer screening: No longer required.   Mammogram status: Completed 12/02/21. Repeat every year- has appointment next month  Bone Density status: Completed 04/17/15. Results reflect: Bone density results: NORMAL. Repeat every 5 years.- declined referral  Lung Cancer Screening: (Low Dose CT Chest recommended if Age 8-80 years, 30 pack-year currently smoking OR have quit w/in 15years.) does not qualify.    Additional Screening:  Hepatitis C Screening: does not qualify; Completed no  Vision Screening: Recommended annual ophthalmology exams for early detection of glaucoma and other disorders of the eye. Is the patient up to date with their annual eye exam?  Yes  Who is the provider or what is the name of the office in which the patient attends annual eye exams? Dr.Lyles If pt is not established with a provider, would they like to be referred to a provider to establish care? No .   Dental Screening: Recommended annual dental exams for proper oral hygiene  Community Resource Referral / Chronic Care Management: CRR required this visit?  No   CCM required this visit?  No      Plan:     I have personally reviewed and noted the following in the patient's chart:   Medical and social history Use of alcohol, tobacco or illicit drugs  Current medications and supplements including opioid prescriptions.  Functional ability and status Nutritional  status Physical activity Advanced directives List of other physicians Hospitalizations, surgeries, and ER visits in previous 12 months Vitals Screenings to include cognitive, depression, and falls Referrals and appointments  In addition, I have reviewed and discussed with patient certain preventive protocols, quality metrics, and best practice recommendations. A written personalized care plan for preventive services as well as general preventive health recommendations were provided to patient.     Dionisio David, LPN   05/13/8109   Nurse Notes: none

## 2022-05-01 ENCOUNTER — Ambulatory Visit (INDEPENDENT_AMBULATORY_CARE_PROVIDER_SITE_OTHER): Payer: Medicare Other | Admitting: Family Medicine

## 2022-05-01 ENCOUNTER — Encounter: Payer: Self-pay | Admitting: Family Medicine

## 2022-05-01 VITALS — BP 104/78 | HR 77 | Temp 97.4°F | Ht 63.0 in | Wt 123.0 lb

## 2022-05-01 DIAGNOSIS — Z0000 Encounter for general adult medical examination without abnormal findings: Secondary | ICD-10-CM

## 2022-05-01 DIAGNOSIS — E785 Hyperlipidemia, unspecified: Secondary | ICD-10-CM

## 2022-05-01 DIAGNOSIS — Z8679 Personal history of other diseases of the circulatory system: Secondary | ICD-10-CM

## 2022-05-01 DIAGNOSIS — M8580 Other specified disorders of bone density and structure, unspecified site: Secondary | ICD-10-CM | POA: Diagnosis not present

## 2022-05-01 DIAGNOSIS — Z85819 Personal history of malignant neoplasm of unspecified site of lip, oral cavity, and pharynx: Secondary | ICD-10-CM

## 2022-05-01 DIAGNOSIS — Z7189 Other specified counseling: Secondary | ICD-10-CM

## 2022-05-01 LAB — VITAMIN D 25 HYDROXY (VIT D DEFICIENCY, FRACTURES): VITD: 52.35 ng/mL (ref 30.00–100.00)

## 2022-05-01 LAB — LIPID PANEL
Cholesterol: 207 mg/dL — ABNORMAL HIGH (ref 0–200)
HDL: 69.6 mg/dL (ref >39.00)
LDL Cholesterol: 117 mg/dL — ABNORMAL HIGH (ref 0–99)
NonHDL: 137.05
Total CHOL/HDL Ratio: 3
Triglycerides: 100 mg/dL (ref 0.0–149.0)
VLDL: 20 mg/dL (ref 0.0–40.0)

## 2022-05-01 MED ORDER — AMOXICILLIN 500 MG PO CAPS: 500.0000 mg | ORAL_CAPSULE | Freq: Once | ORAL | 0 refills | Status: AC

## 2022-05-01 MED ORDER — ATORVASTATIN CALCIUM 20 MG PO TABS: ORAL_TABLET | ORAL | 3 refills | Status: DC

## 2022-05-01 MED ORDER — AMOXICILLIN 500 MG PO CAPS: 2000.0000 mg | ORAL_CAPSULE | Freq: Once | ORAL | Status: DC

## 2022-05-01 NOTE — Progress Notes (Signed)
BP is controlled and she isn't having heart racing.  She is off metoprolol and we talked about staying off med for now.  She can update me as needed.  Still on aspirin.  Taking amoxil prior to dental visits.    Elevated Cholesterol: Using medications without problems:yes Muscle aches: no Diet compliance: d/w pt.   Labs pending.    H/o osteopenia with vit D pending.    Swallowing well.  H/o salivary gland cancer.  Had ENT eval.  She has radiation related changes.    Flu yearly Shingles prev done.   PNA UTD Tetanus 2014 covid vaccine 2021, Moderna Colonoscopy deferred.   Breast cancer screening d/w pt.  pending 2023.   Advance directive- husband designated if patient were incapacitated.  History of osteopenia noted. DXA declined, unable to tolerate fosamax, vit D pending, d/w pt. see notes on labs.    PMH and SH reviewed  Meds, vitals, and allergies reviewed.   ROS: Per HPI unless specifically indicated in ROS section   GEN: nad, alert and oriented HEENT: ncat NECK: supple w/o LA, chronic radiation related changes on the left side of the neck without acute changes noted CV: rrr. PULM: ctab, no inc wob ABD: soft, +bs EXT: no edema SKIN: no acute rash

## 2022-05-01 NOTE — Patient Instructions (Signed)
Go to the lab on the way out.   If you have mychart we'll likely use that to update you.    Take care.  Glad to see you. Plan on recheck in 1 year.

## 2022-05-04 NOTE — Assessment & Plan Note (Signed)
Advance directive- husband designated if patient were incapacitated.

## 2022-05-04 NOTE — Assessment & Plan Note (Signed)
BP is controlled and she isn't having heart racing.  She is off metoprolol and we talked about staying off med for now.  She can update me as needed.

## 2022-05-04 NOTE — Assessment & Plan Note (Signed)
Swallowing well.  H/o salivary gland cancer.  Had ENT eval.  She has radiation related changes.  She will update me as needed.

## 2022-05-04 NOTE — Assessment & Plan Note (Signed)
Continue atorvastatin

## 2022-05-04 NOTE — Assessment & Plan Note (Signed)
Recheck vitamin D level pending.  See notes on labs.

## 2022-05-04 NOTE — Assessment & Plan Note (Signed)
Flu yearly Shingles prev done.   PNA UTD Tetanus 2014 covid vaccine 2021, Moderna Colonoscopy deferred.   Breast cancer screening d/w pt.  pending 2023.   Advance directive- husband designated if patient were incapacitated.  History of osteopenia noted. DXA declined, unable to tolerate fosamax, vit D pending, d/w pt. see notes on labs.

## 2022-05-15 ENCOUNTER — Ambulatory Visit
Admission: RE | Admit: 2022-05-15 | Discharge: 2022-05-15 | Disposition: A | Discharge status: Home / Self Care | Payer: Medicare Other | Source: Ambulatory Visit | Attending: Family Medicine | Admitting: Family Medicine

## 2022-05-15 DIAGNOSIS — Z1231 Encounter for screening mammogram for malignant neoplasm of breast: Secondary | ICD-10-CM

## 2022-06-25 ENCOUNTER — Ambulatory Visit: Payer: Medicare Other

## 2022-07-16 ENCOUNTER — Ambulatory Visit: Payer: Medicare Other

## 2022-07-24 ENCOUNTER — Ambulatory Visit (INDEPENDENT_AMBULATORY_CARE_PROVIDER_SITE_OTHER): Payer: Medicare Other

## 2022-07-24 DIAGNOSIS — Z23 Encounter for immunization: Secondary | ICD-10-CM | POA: Diagnosis not present

## 2022-08-12 ENCOUNTER — Ambulatory Visit
Admission: RE | Admit: 2022-08-12 | Discharge: 2022-08-12 | Disposition: A | Discharge status: Home / Self Care | Payer: Medicare Other | Source: Ambulatory Visit | Attending: Family Medicine | Admitting: Family Medicine

## 2022-08-18 ENCOUNTER — Encounter: Payer: Self-pay | Admitting: Family Medicine

## 2022-08-18 ENCOUNTER — Ambulatory Visit: Payer: Medicare Other | Admitting: Family Medicine

## 2022-08-18 VITALS — BP 120/62 | HR 81 | Temp 97.2°F | Ht 63.0 in | Wt 121.0 lb

## 2022-08-18 DIAGNOSIS — R202 Paresthesia of skin: Secondary | ICD-10-CM

## 2022-08-18 DIAGNOSIS — Z952 Presence of prosthetic heart valve: Secondary | ICD-10-CM | POA: Diagnosis not present

## 2022-08-18 NOTE — Patient Instructions (Addendum)
Let me know if you don't get a call about seeing cardiology in [place].  Take care.  Glad to see you. I would get OTC soft arch support inserts and see if that helps.

## 2022-08-18 NOTE — Progress Notes (Unsigned)
R foot tingling.  Prev with some R leg pain but that resolved.  No tingling in the L foot.    R foot sx started about 1 week ago.  Intermittent sx in the R foot.  No foot pain.  Not complete loss of sensation.  No fall, no trauma.  No clear trigger.  No weakness in the leg or foot.    No hand tingling.  Feels well o/w except for some itching on the skin, sporadically.  Has use OTC cream with relief.  No skin lesions seen.  D/w pt about trying a mild soap.    She needed to get set up with cardiology closer to home.  D/w pt.  Ordered.    Meds, vitals, and allergies reviewed.   ROS: Per HPI unless specifically indicated in ROS section   Nad Ncat CN2-12 wnl except for dec hearing L ear at baseline.   S/S grossly wnl x4 R foot with normal inspection except for dropped metatarsal heads. Plantar MT head area is the region that was affected (but not now).  Normal sensation now.  She did not have change in sensation of the toes previously. Rrr Ctab

## 2022-08-20 DIAGNOSIS — R202 Paresthesia of skin: Secondary | ICD-10-CM | POA: Insufficient documentation

## 2022-08-20 NOTE — Assessment & Plan Note (Signed)
Refer to cardiology locally.

## 2022-08-20 NOTE — Assessment & Plan Note (Signed)
Unilateral and could be mechanical due to peripheral nerve compression due to loss of arch.  Discussed using arch supports and update me as needed.  She agrees to plan.

## 2022-09-01 ENCOUNTER — Telehealth: Payer: Self-pay

## 2022-09-01 NOTE — Telephone Encounter (Signed)
Agree. Thanks

## 2022-09-01 NOTE — Telephone Encounter (Signed)
Pt said for 2 - 3 wks on and off has 5- 10 min episodes or lower lt to mid dull abd pain. Pt said can occur 2 - 3 times a day for no known reason and no warning. Pt said when has pain usually a 3 - 4 pain level. No diarrhea, no constipation more than usual, no N&V. no blood, no fever, no back pain, no UTI symptoms. Food does not seem to trigger pain. Pt is not having pain now and can function with pain doing her normal daily routine.  Pt does not ever remember this type pain before. Pt said did do some yard work before Thanksgiving but did not make pain worse and pt had pain before the yard work. Now T 97.6 P 79 pulse ox 98% room air BP 164/80  and reck 160/80 lt arm reg cuff sitting. Pt said she was little anxious having to talk with me. Pt scheduled appt with Dr Damita Dunnings on 09/02/22 at 8:30 with UC & ED precautions given and pt voiced understanding and appreciative of appt. Sending note to Dr Damita Dunnings and Damita Dunnings pool.

## 2022-09-02 ENCOUNTER — Ambulatory Visit: Payer: Medicare Other | Admitting: Family Medicine

## 2022-09-02 ENCOUNTER — Encounter: Payer: Self-pay | Admitting: Family Medicine

## 2022-09-02 VITALS — BP 104/80 | HR 88 | Temp 97.1°F | Ht 63.0 in | Wt 123.0 lb

## 2022-09-02 DIAGNOSIS — R1030 Lower abdominal pain, unspecified: Secondary | ICD-10-CM

## 2022-09-02 NOTE — Patient Instructions (Addendum)
3 possible issues.   Lower back muscle strain.  Gently stretch (knee to chest), heat and ice as needed.   L trochanteric bursitis. Ice as needed.  L groin strain.  Gently stretch.    Update me as needed.   Take care.  Glad to see you. We can set you up with PT if needed.

## 2022-09-02 NOTE — Progress Notes (Unsigned)
L lower inguinal pain.  She is helping he her husband, doing more lifting.  Episodic sx.  Not all the time.  Going on a few weeks, maybe longer.  No fevers, no chills.  Weight is stable.  No black or bloody stools.  No blood in urine, no burning with urination.  No specific movement triggers the sx.  Some occ L lateral hip pain but able to lay on L side at night.  Some occ back pain, lower back.  No rash or bruising.  No R sided sx.  No mid abd sx.  No change in sensation or pain in the legs.    Likely L lower back, L greater troch, L inguinal strain.   No hernia or LA noted on exam.   No rash.

## 2022-09-03 DIAGNOSIS — R1030 Lower abdominal pain, unspecified: Secondary | ICD-10-CM | POA: Insufficient documentation

## 2022-09-03 NOTE — Assessment & Plan Note (Addendum)
No mass felt. 3 likely/possible issues.   Lower back muscle strain.  Gently stretch (knee to chest), heat and ice as needed.   L trochanteric bursitis. Ice as needed.  L groin strain.  Gently stretch.   Anatomy discussed with patient. Update me as needed.   We can set up PT if needed.   She agrees to plan.

## 2022-10-02 NOTE — Progress Notes (Deleted)
Cardiology Office Note:    Date:  10/02/2022   ID:  Whitney Manning, DOB [birth year], MRN [id number]  PCP:  Tonia Ghent, MD   [facility] Providers Cardiologist:  None    Referring MD: Tonia Ghent, MD    History of Present Illness:    Whitney Manning is a 87 y.o. female with a hx of HTN, HLD, severe aortic stenosis s/p AVR who was referred by Dr. Damita Dunnings for further evaluation of AVR.  Patient previously followed by [facility]. Notes reviewed. Patient has history of severe aortic stenosis with AVR in 04/2009. Has ascending aortic arch reconstruction with single vessel bypass with a LIMA to LAD in 04/2009. TTE 2019 with LVEF 50-55%, normal RV, mild MR, mild TR, normal functioning aortic valve prothesis.  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)   CHOLECYSTECTOMY     COLONOSCOPY     LUNG LOBECTOMY  2016   at [facility], for pulmonary nodule   salivary gland surgery for cancer on the left parotid      Current Medications: No outpatient medications have been marked as taking for the 10/13/22 encounter (Appointment) with Freada Bergeron, MD.     Allergies:   Fosamax [alendronate sodium] and Metoprolol   Social History   Socioeconomic History   Marital status: Married    Spouse name: Not on file   Number of children: Not on file   Years of education: Not on file   Highest education level: Not on file  Occupational History   Not on file  Tobacco Use   Smoking status: Never   Smokeless tobacco: Never  Substance and Sexual Activity   Alcohol use: Yes    Comment: rare   Drug use: No   Sexual activity:  Yes  Other Topics Concern   Not on file  Social History Narrative   All siblings deceased   First husband died of colon cancer.  Remarried 1998.    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 [place] Strain: [place]  (04/30/2022)   Overall Financial Resource Strain (CARDIA)    Difficulty of Paying Living Expenses: Not hard at all  Food Insecurity: No Food Insecurity (04/30/2022)   Hunger Vital Sign    Worried About Running Out of Food in the Last Year: Never true    [place] in the Last Year: Never true  Transportation Needs: No Transportation Needs (04/30/2022)   PRAPARE - Hydrologist (Medical): No    Lack of Transportation (Non-Medical): No  Physical Activity: Sufficiently Active (04/30/2022)   Exercise Vital Sign    Days of Exercise per Week: 5 days    Minutes of Exercise per Session: 30 min  Stress: No Stress Concern Present (04/30/2022)   [place]    Feeling of Stress : Not at all  Social Connections: Moderately Integrated (  04/30/2022)   Social Connection and Isolation Panel [NHANES]    Frequency of Communication with Friends and Family: More than three times a week    Frequency of Social Gatherings with Friends and Family: Once a week    Attends Religious Services: More than 4 times per year    Active Member of [employer] or Organizations: No    Attends Archivist Meetings: Never    Marital Status: Married     Family History: The patient's ***family history includes Heart disease in her father; Stroke in her mother. There is no history of Breast cancer or Colon cancer.  ROS:   Please see the history of present illness.    *** All other systems reviewed and are negative.  EKGs/Labs/Other Studies Reviewed:    The following studies were reviewed today: ***  EKG:  EKG is *** ordered  today.  The ekg ordered today demonstrates ***  Recent Labs: No results found for requested labs within last 365 days.  Recent Lipid Panel    Component Value Date/Time   CHOL 207 (H) 05/01/2022 0958   TRIG 100.0 05/01/2022 0958   TRIG 93 10/12/2006 1008   HDL 69.60 05/01/2022 0958   CHOLHDL 3 05/01/2022 0958   VLDL 20.0 05/01/2022 0958   LDLCALC 117 (H) 05/01/2022 0958   LDLDIRECT 131.6 09/16/2012 1131     Risk Assessment/Calculations:   {Does this patient have ATRIAL FIBRILLATION?[phone]}  No BP recorded.  {Refresh Note OR Click here to enter BP  :1}***         Physical Exam:    VS:  There were no vitals taken for this visit.    Wt Readings from Last 3 Encounters:  09/02/22 123 lb (55.8 kg)  08/18/22 121 lb (54.9 kg)  05/01/22 123 lb (55.8 kg)     GEN: *** Well nourished, well developed in no acute distress HEENT: Normal NECK: No JVD; No carotid bruits LYMPHATICS: No lymphadenopathy CARDIAC: ***RRR, no murmurs, rubs, gallops RESPIRATORY:  Clear to auscultation without rales, wheezing or rhonchi  ABDOMEN: Soft, non-tender, non-distended MUSCULOSKELETAL:  No edema; No deformity  SKIN: Warm and dry NEUROLOGIC:  Alert and oriented x 3 PSYCHIATRIC:  Normal affect   ASSESSMENT:    No diagnosis found. PLAN:    In order of problems listed above:  #Severe AS s/p AVR in 2010: Previously followed by [facility]. TTE 2019 with well functioning aortic valve prosthesis. Currently *** -Check TTE' -Continue ASA 81mg  daily -Dental ppx  #CAD s/p LIMA to LAD: Doing well without anginal symptoms. -Continue ASA 81mg  daily -Continue lipitor 20mg  daily  #Chronic LBBB: -Check TTE for monitoring  #HLD: 0Continue lipitor 20mg  daily      {Are you ordering a CV Procedure (e.g. stress test, cath, DCCV, TEE, etc)?   Press F2        :[phone]}    Medication Adjustments/Labs and Tests Ordered: Current medicines are reviewed at length with the patient today.  Concerns  regarding medicines are outlined above.  No orders of the defined types were placed in this encounter.  No orders of the defined types were placed in this encounter.   There are no Patient Instructions on file for this visit.   Signed, Freada Bergeron, MD  10/02/2022 8:50 AM    [place]

## 2022-10-10 NOTE — Progress Notes (Signed)
Cardiology Office Note:    Date:  10/13/2022   ID:  Whitney Manning, DOB Feb 12, 1935, MRN [id number]  PCP:  Tonia Ghent, MD   [facility] Providers Cardiologist:  None    Referring MD: Tonia Ghent, MD    History of Present Illness:    Whitney Manning is a 88 y.o. female with a hx of HTN, HLD, history of lung cancer s/p RUL lobectomy and XRT and severe aortic stenosis s/p AVR who was referred by Dr. Damita Dunnings for further evaluation of AVR.  Patient previously followed by [facility]. Notes reviewed. Patient has history of severe aortic stenosis with AVR in 04/2009. Has ascending aortic arch reconstruction with single vessel bypass with a LIMA to LAD in 04/2009. TTE 2019 with LVEF 50-55%, normal RV, mild MR, mild TR, normal functioning aortic valve prothesis.  Today, the patient states that she has been doing well. Has not had significant cardiac issues since her AVR +1v CABG in 2010. She has never been hospitalized for fluid retention. No known history of Afib. At this time she denies any significant cardiovascular symptoms. No chest pain or anginal symptoms.  Currently she is taking a full dose 325 mg aspirin daily. Also on atorvastatin for cholesterol management. She is tolerating her statin well.  Several years ago she underwent right lung lobectomy due to lung cancer. She did receive right-sided chest radiation therapy.  She denies any palpitations, or peripheral edema. No lightheadedness, headaches, syncope, orthopnea, or PND.   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)   CHOLECYSTECTOMY     COLONOSCOPY     LUNG LOBECTOMY  2016    at [facility], for pulmonary nodule   salivary gland surgery for cancer on the left parotid      Current Medications: Current Meds  Medication Sig   aspirin EC 81 MG tablet Take 1 tablet (81 mg total) by mouth daily. Swallow whole.   atorvastatin (LIPITOR) 40 MG tablet Take 1 tablet (40 mg total) by mouth daily.   Cholecalciferol (VITAMIN D) 2000 UNITS CAPS Take 1 capsule (2,000 Units total) by mouth daily.   co-enzyme Q-10 30 MG capsule Take 100 mg by mouth every other day.    fish oil-omega-3 fatty acids 1000 MG capsule Take 2 g by mouth 2 (two) times daily.     triamcinolone cream (KENALOG) 0.1 % Apply 1 application topically 2 (two) times daily as needed. For itching skin   [DISCONTINUED] aspirin 325 MG tablet Take 325 mg by mouth daily.     [DISCONTINUED] atorvastatin (LIPITOR) 20 MG tablet TAKE 1 TABLET BY MOUTH EVERY OTHER DAY     Allergies:   Fosamax [alendronate sodium] and Metoprolol   Social History   Socioeconomic History   Marital status: Married    Spouse name: Not on file   Number of children: Not on file   Years of education: Not on file   Highest education level: Not on file  Occupational History   Not on file  Tobacco Use   Smoking status: Never  Smokeless tobacco: Never  Substance and Sexual Activity   Alcohol use: Yes    Comment: rare   Drug use: No   Sexual activity: Yes  Other Topics Concern   Not on file  Social History Narrative   All siblings deceased   First husband died of colon cancer.  Remarried 1998.    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 [place] Strain: [place]  (04/30/2022)   Overall Financial Resource Strain (CARDIA)    Difficulty of Paying Living Expenses: Not hard at all  Food Insecurity: No Food Insecurity (04/30/2022)   Hunger Vital Sign    Worried About Running Out of Food in the Last Year: Never true    [place] in the Last Year: Never  true  Transportation Needs: No Transportation Needs (04/30/2022)   PRAPARE - Hydrologist (Medical): No    Lack of Transportation (Non-Medical): No  Physical Activity: Sufficiently Active (04/30/2022)   Exercise Vital Sign    Days of Exercise per Week: 5 days    Minutes of Exercise per Session: 30 min  Stress: No Stress Concern Present (04/30/2022)   [place]    Feeling of Stress : Not at all  Social Connections: Moderately Integrated (04/30/2022)   Social Connection and Isolation Panel [NHANES]    Frequency of Communication with Friends and Family: More than three times a week    Frequency of Social Gatherings with Friends and Family: Once a week    Attends Religious Services: More than 4 times per year    Active Member of [employer] or Organizations: No    Attends Archivist Meetings: Never    Marital Status: Married     Family History: The patient's family history includes Heart disease in her father; Stroke in her mother. There is no history of Breast cancer or Colon cancer.  ROS:   Review of Systems  Constitutional:  Negative for chills and fever.  HENT:  Negative for nosebleeds and tinnitus.   Eyes:  Negative for blurred vision and pain.  Respiratory:  Negative for cough, hemoptysis, shortness of breath and stridor.   Cardiovascular:  Negative for chest pain, palpitations, orthopnea, claudication, leg swelling and PND.  Gastrointestinal:  Negative for blood in stool, diarrhea, nausea and vomiting.  Genitourinary:  Negative for dysuria and hematuria.  Musculoskeletal:  Negative for falls.  Neurological:  Negative for dizziness, loss of consciousness and headaches.  Psychiatric/Behavioral:  Negative for depression, hallucinations and substance abuse. The patient does not have insomnia.      EKGs/Labs/Other Studies Reviewed:    The following studies were reviewed today:  CT  Chest  10/22/2021  ([place]): 1. Overall stable appearance of a left fissural nodule. Some increased nodularity within the right lower lobe posteriorly which is probably related to inflammation or possibly infection.  2. Nodularity along the left pleural surface is unchanged. Partial lobectomy right upper lobe.   TTE  09/15/2018  ([place]): SUMMARY  The left ventricular size is normal.   Left ventricular systolic function is low normal.  LV ejection fraction = 50-55%.  Abnormal (paradoxical) septal motion consistent with post-operative status.  The right ventricle is normal in size and function.  The prosthetic aortic valve is well-seated with normal function  There is mild mitral regurgitation.  There  is mild tricuspid regurgitation.  No significant stenosis seen  There was insufficient TR detected to calculate RV systolic pressure.  Estimated right atrial pressure is 5 mmHg..  Mild pulmonic valvular regurgitation.  There is no pericardial effusion.  Probably no significant change in comparison with the prior study noted   EKG:  EKG is personally reviewed.  10/13/2022:  Sinus rhythm. IVCD. Rate 88 bpm.  Recent Labs: No results found for requested labs within last 365 days.   Recent Lipid Panel    Component Value Date/Time   CHOL 207 (H) 05/01/2022 0958   TRIG 100.0 05/01/2022 0958   TRIG 93 10/12/2006 1008   HDL 69.60 05/01/2022 0958   CHOLHDL 3 05/01/2022 0958   VLDL 20.0 05/01/2022 0958   LDLCALC 117 (H) 05/01/2022 0958   LDLDIRECT 131.6 09/16/2012 1131     Risk Assessment/Calculations:                Physical Exam:    VS:  BP 136/82   Pulse 88   Ht 5\' 3"  (1.6 m)   Wt 122 lb 3.2 oz (55.4 kg)   SpO2 97%   BMI 21.65 kg/m     Wt Readings from Last 3 Encounters:  10/13/22 122 lb 3.2 oz (55.4 kg)  09/02/22 123 lb (55.8 kg)  08/18/22 121 lb (54.9 kg)     GEN: Well nourished, well developed in no acute distress HEENT: Normal NECK: No  JVD; No carotid bruits CARDIAC: RRR, 2/6 early systolic murmur, No rubs, No gallops RESPIRATORY:  Clear to auscultation without rales, wheezing or rhonchi  ABDOMEN: Soft, non-tender, non-distended MUSCULOSKELETAL:  No edema; No deformity  SKIN: Warm and dry NEUROLOGIC:  Alert and oriented x 3 PSYCHIATRIC:  Normal affect   ASSESSMENT:    1. Coronary artery disease involving native coronary artery of native heart without angina pectoris   2. Severe aortic stenosis   3. S/P aortic valve replacement with porcine valve   4. Mixed hyperlipidemia    PLAN:    In order of problems listed above:  #Severe AS s/p AVR in 2010: Previously followed by [facility]. TTE 2019 with well functioning aortic valve prosthesis. Currently doing well without heart failure or anginal symptoms. -Check TTE -Change ASA to 81mg  daily -Dental ppx  #CAD s/p LIMA to LAD: Doing well without anginal symptoms. -Change ASA to 81mg  daily -Increase lipitor to 40mg  daily as LDL above goal at 117  #IVCD: -Chronic  #HLD: LDL above goal at 117. -Increase lipitor to 40mg  daily -Repeat lipids in 8 weeks for monitoring         Follow-up: 1 year.  Medication Adjustments/Labs and Tests Ordered: Current medicines are reviewed at length with the patient today.  Concerns regarding medicines are outlined above.   Orders Placed This Encounter  Procedures   Lipid Profile   EKG 12-Lead   ECHOCARDIOGRAM COMPLETE   Meds ordered this encounter  Medications   aspirin EC 81 MG tablet    Sig: Take 1 tablet (81 mg total) by mouth daily. Swallow whole.    Dispense:  90 tablet    Refill:  3   atorvastatin (LIPITOR) 40 MG tablet    Sig: Take 1 tablet (40 mg total) by mouth daily.    Dispense:  90 tablet    Refill:  2    Dose increase   There are no Patient Instructions on file for this visit.   I,Mathew Stumpf,acting as a Education administrator for Freada Bergeron, MD.,have documented  all relevant documentation on the behalf of  Freada Bergeron, MD,as directed by  Freada Bergeron, MD while in the presence of Freada Bergeron, MD.  I, Freada Bergeron, MD, have reviewed all documentation for this visit. The documentation on 10/13/22 for the exam, diagnosis, procedures, and orders are all accurate and complete.   Signed, Freada Bergeron, MD  10/13/2022 10:28 AM    [place]

## 2022-10-13 ENCOUNTER — Encounter: Payer: Self-pay | Admitting: Cardiology

## 2022-10-13 ENCOUNTER — Ambulatory Visit: Payer: Medicare Other | Attending: Cardiology | Admitting: Cardiology

## 2022-10-13 VITALS — BP 136/82 | HR 88 | Ht 63.0 in | Wt 122.2 lb

## 2022-10-13 DIAGNOSIS — E782 Mixed hyperlipidemia: Secondary | ICD-10-CM | POA: Diagnosis not present

## 2022-10-13 DIAGNOSIS — Z953 Presence of xenogenic heart valve: Secondary | ICD-10-CM

## 2022-10-13 DIAGNOSIS — I2510 Atherosclerotic heart disease of native coronary artery without angina pectoris: Secondary | ICD-10-CM | POA: Diagnosis not present

## 2022-10-13 DIAGNOSIS — I350 Nonrheumatic aortic (valve) stenosis: Secondary | ICD-10-CM

## 2022-10-13 MED ORDER — ASPIRIN 81 MG PO TBEC: 81.0000 mg | DELAYED_RELEASE_TABLET | Freq: Every day | ORAL | 3 refills | Status: DC

## 2022-10-13 MED ORDER — ATORVASTATIN CALCIUM 40 MG PO TABS: 40.0000 mg | ORAL_TABLET | Freq: Every day | ORAL | 2 refills | Status: DC

## 2022-10-13 NOTE — Patient Instructions (Signed)
Medication Instructions:   DECREASE YOUR ASPIRIN TO 81 MG BY MOUTH DAILY  INCREASE YOUR ATORVASTATIN (LIPITOR) TO 40 MG BY MOUTH DAILY  *If you need a refill on your cardiac medications before your next appointment, please call your pharmacy*   Lab Work:  IN 8 WEEKS HERE IN THE OFFICE--CHECK LIPIDS--PLEASE COME FASTING TO THIS LAB APPOINTMENT--MAY HAVE ECHO SCHEDULED SAME DAY AS THIS APPOINTMENT PER DR. Johney Frame   If you have labs (blood work) drawn today and your tests are completely normal, you will receive your results only by: [place] (if you have MyChart) OR A paper copy in the mail If you have any lab test that is abnormal or we need to change your treatment, we will call you to review the results.   Testing/Procedures:  Your physician has requested that you have an echocardiogram. Echocardiography is a painless test that uses sound waves to create images of your heart. It provides your doctor with information about the size and shape of your heart and how well your heart's chambers and valves are working. This procedure takes approximately one hour. There are no restrictions for this procedure.  SCHEDULE SAME DAY AS 8 WEEK LAB APPOINTMENT WITH OUR OFFICE  Please do NOT wear cologne, perfume, aftershave, or lotions (deodorant is allowed). Please arrive 15 minutes prior to your appointment time.    Follow-Up: At [facility], you and your health needs are our priority.  As part of our continuing mission to provide you with exceptional heart care, we have created designated Provider Care Teams.  These Care Teams include your primary Cardiologist (physician) and Advanced Practice Providers (APPs -  Physician Assistants and Nurse Practitioners) who all work together to provide you with the care you need, when you need it.  We recommend signing up for the patient portal called "MyChart".  Sign up information is provided on this After Visit Summary.  MyChart is used  to connect with patients for Virtual Visits (Telemedicine).  Patients are able to view lab/test results, encounter notes, upcoming appointments, etc.  Non-urgent messages can be sent to your provider as well.   To learn more about what you can do with MyChart, go to [email or web address].    Your next appointment:   1 year(s)  The format for your next appointment:   In Person  Provider:   DR. Johney Frame   Important Information About Sugar

## 2022-12-08 ENCOUNTER — Ambulatory Visit: Payer: Medicare Other

## 2022-12-08 ENCOUNTER — Ambulatory Visit (HOSPITAL_COMMUNITY): Payer: Medicare Other | Attending: Cardiology

## 2022-12-08 DIAGNOSIS — I350 Nonrheumatic aortic (valve) stenosis: Secondary | ICD-10-CM

## 2022-12-08 DIAGNOSIS — Z953 Presence of xenogenic heart valve: Secondary | ICD-10-CM | POA: Diagnosis not present

## 2022-12-08 DIAGNOSIS — E782 Mixed hyperlipidemia: Secondary | ICD-10-CM

## 2022-12-08 DIAGNOSIS — I2510 Atherosclerotic heart disease of native coronary artery without angina pectoris: Secondary | ICD-10-CM

## 2022-12-08 LAB — ECHOCARDIOGRAM COMPLETE
AR max vel: 2.05 cm2
AV Area VTI: 2.46 cm2
AV Area mean vel: 2.34 cm2
AV Mean grad: 2 mmHg
AV Peak grad: 2.9 mmHg
Ao pk vel: 0.85 m/s
Area-P 1/2: 7.9 cm2
MV VTI: 1.8 cm2
P 1/2 time: 427 msec
S' Lateral: 2.7 cm

## 2022-12-08 LAB — LIPID PANEL
Chol/HDL Ratio: 2.2 ratio (ref 0.0–4.4)
Cholesterol, Total: 179 mg/dL (ref 100–199)
HDL: 83 mg/dL (ref >39)
LDL Chol Calc (NIH): 83 mg/dL (ref 0–99)
Triglycerides: 71 mg/dL (ref 0–149)
VLDL Cholesterol Cal: 13 mg/dL (ref 5–40)

## 2022-12-09 ENCOUNTER — Telehealth: Payer: Self-pay | Admitting: Cardiology

## 2022-12-09 DIAGNOSIS — Z79899 Other long term (current) drug therapy: Secondary | ICD-10-CM

## 2022-12-09 DIAGNOSIS — I2510 Atherosclerotic heart disease of native coronary artery without angina pectoris: Secondary | ICD-10-CM

## 2022-12-09 DIAGNOSIS — E782 Mixed hyperlipidemia: Secondary | ICD-10-CM

## 2022-12-09 MED ORDER — EZETIMIBE 10 MG PO TABS: 10.0000 mg | ORAL_TABLET | Freq: Every day | ORAL | 2 refills | Status: DC

## 2022-12-09 NOTE — Telephone Encounter (Signed)
The patient has been notified of the result and verbalized understanding.  All questions (if any) were answered.  Pt states she would like to start zetia 10 mg po daily and come in for repeat lipids in 3 months.   Confirmed the pharmacy of choice with the pt.   Scheduled the pt for repeat lipids in 3 months on 03/11/23.  She is aware to come fasting to this lab appt.   Pt verbalized understanding and agrees with this plan.

## 2022-12-09 NOTE — Telephone Encounter (Signed)
Patient returned call for echo results.

## 2022-12-09 NOTE — Telephone Encounter (Signed)
Patient returned RN's call. 

## 2022-12-09 NOTE — Telephone Encounter (Signed)
Returned to patient. Unable to leave voicemail. Received busy signal x3.

## 2022-12-09 NOTE — Telephone Encounter (Signed)
-----   Message from Freada Bergeron, MD sent at 12/09/2022 11:46 AM EST ----- Cholesterol is much improved but not quite at goal. Goal <70. She can either try with lifestyle modifications and repeat in 3 months or start zetia '10mg'$  daily and repeat lipids in 3 months to get to goal.

## 2022-12-20 NOTE — Progress Notes (Unsigned)
Cardiology Office Note:    Date:  12/23/2022   ID:  DEMARCUS GUZMAN, DOB September 04, 1935, MRN [id number]  PCP:  Tonia Ghent, MD   [facility] Providers Cardiologist:  None    Referring MD: Tonia Ghent, MD    History of Present Illness:    Whitney Manning is a 88 y.o. female with a hx of HTN, HLD, history of lung cancer s/p RUL lobectomy and XRT and severe aortic stenosis s/p AVR who presents to clinic for follow-up.  Patient previously followed by [facility]. Notes reviewed. Patient has history of severe aortic stenosis with AVR in 04/2009. Has ascending aortic arch reconstruction with single vessel bypass with a LIMA to LAD in 04/2009. TTE 2019 with LVEF 50-55%, normal RV, mild MR, mild TR, normal functioning aortic valve prothesis.  Patient seen in 10/2022 where she was doing well. TTE showed EF 40-45%, G1DD, normal RV, trivial MR, well functioning aortic valve prosthesis with mean gradient 33mmHg. We tried to discuss her echo results with her on the phone but she was hard of hearing and therefore she is now scheduled to see [place] to discuss in person.  Today, the patient overall feels well today. No chest pain, SOB, LE edema, palpitations, orthopnea or PND. Tolerating medications as prescribed. Echo results reviewed at length today. Notably, has allergy to metop as caused "itching" in the past.   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)   CHOLECYSTECTOMY     COLONOSCOPY     LUNG LOBECTOMY  2016   at [facility], for pulmonary nodule   salivary gland surgery for cancer on the left parotid      Current Medications: Current Meds   Medication Sig   aspirin EC 81 MG tablet Take 1 tablet (81 mg total) by mouth daily. Swallow whole.   atorvastatin (LIPITOR) 40 MG tablet Take 1 tablet (40 mg total) by mouth daily.   Cholecalciferol (VITAMIN D) 2000 UNITS CAPS Take 1 capsule (2,000 Units total) by mouth daily.   co-enzyme Q-10 30 MG capsule Take 100 mg by mouth every other day.    ezetimibe (ZETIA) 10 MG tablet Take 1 tablet (10 mg total) by mouth daily.   fish oil-omega-3 fatty acids 1000 MG capsule Take 2 g by mouth 2 (two) times daily.     sacubitril-valsartan (ENTRESTO) 24-26 MG Take 1 tablet by mouth 2 (two) times daily.   triamcinolone cream (KENALOG) 0.1 % Apply 1 application topically 2 (two) times daily as needed. For itching skin     Allergies:   Fosamax [alendronate sodium] and Metoprolol   Social History   Socioeconomic History   Marital status: Married    Spouse name: Not on file   Number of children: Not on file   Years of education: Not on file   Highest education level: Not on file  Occupational History   Not on file  Tobacco Use   Smoking status: Never   Smokeless tobacco: Never  Substance and Sexual Activity   Alcohol use: Yes    Comment: rare  Drug use: No   Sexual activity: Yes  Other Topics Concern   Not on file  Social History Narrative   All siblings deceased   First husband died of colon cancer.  Remarried 1998.    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 [place] Strain: [place]  (04/30/2022)   Overall Financial Resource Strain (CARDIA)    Difficulty of Paying Living Expenses: Not hard at all  Food Insecurity: No Food Insecurity (04/30/2022)   Hunger Vital Sign    Worried About Running Out of Food in the Last Year: Never true    [place] in the Last Year: Never true  Transportation Needs: No Transportation Needs (04/30/2022)   PRAPARE - Hydrologist (Medical):  No    Lack of Transportation (Non-Medical): No  Physical Activity: Sufficiently Active (04/30/2022)   Exercise Vital Sign    Days of Exercise per Week: 5 days    Minutes of Exercise per Session: 30 min  Stress: No Stress Concern Present (04/30/2022)   [place]    Feeling of Stress : Not at all  Social Connections: Moderately Integrated (04/30/2022)   Social Connection and Isolation Panel [NHANES]    Frequency of Communication with Friends and Family: More than three times a week    Frequency of Social Gatherings with Friends and Family: Once a week    Attends Religious Services: More than 4 times per year    Active Member of [employer] or Organizations: No    Attends Archivist Meetings: Never    Marital Status: Married     Family History: The patient's family history includes Heart disease in her father; Stroke in her mother. There is no history of Breast cancer or Colon cancer.  ROS:   Review of Systems  Constitutional:  Negative for chills and fever.  HENT:  Positive for congestion. Negative for nosebleeds and tinnitus.   Respiratory:  Negative for cough, hemoptysis, shortness of breath and stridor.   Cardiovascular:  Negative for chest pain, palpitations, orthopnea, claudication, leg swelling and PND.  Gastrointestinal:  Negative for blood in stool, diarrhea, nausea and vomiting.  Genitourinary:  Negative for dysuria and hematuria.  Musculoskeletal:  Negative for falls.  Neurological:  Negative for dizziness, loss of consciousness and headaches.  Psychiatric/Behavioral:  Negative for depression. The patient has insomnia.      EKGs/Labs/Other Studies Reviewed:    The following studies were reviewed today: TTE 26-Dec-2022: IMPRESSIONS     1. Left ventricular ejection fraction, by estimation, is 40 to 45%. The  left ventricle has mildly decreased function. The left ventricle  demonstrates regional wall  motion abnormalities (see scoring  diagram/findings for description). Septal dyskinesis.  Left ventricular diastolic parameters are consistent with Grade I  diastolic dysfunction (impaired relaxation).   2. Right ventricular systolic function is normal. The right ventricular  size is normal. Tricuspid regurgitation signal is inadequate for assessing  PA pressure.   3. The mitral valve is degenerative. Trivial mitral valve regurgitation.  Severe mitral annular calcification.   4. The aortic valve has been repaired/replaced. Aortic valve  regurgitation is trivial. There is a bioprosthetic valve present in the  aortic position. Procedure Date: 2010. Echo findings are consistent with  normal structure and function of the aortic  valve prosthesis. Aortic valve mean gradient measures 2.0  mmHg.   5. The inferior vena cava is normal in size with greater than 50%  respiratory variability, suggesting right atrial pressure of 3 mmHg.   CT Chest  10/22/2021  ([place]): 1. Overall stable appearance of a left fissural nodule. Some increased nodularity within the right lower lobe posteriorly which is probably related to inflammation or possibly infection.  2. Nodularity along the left pleural surface is unchanged. Partial lobectomy right upper lobe.   TTE  09/15/2018  ([place]): SUMMARY  The left ventricular size is normal.   Left ventricular systolic function is low normal.  LV ejection fraction = 50-55%.  Abnormal (paradoxical) septal motion consistent with post-operative status.  The right ventricle is normal in size and function.  The prosthetic aortic valve is well-seated with normal function  There is mild mitral regurgitation.  There is mild tricuspid regurgitation.  No significant stenosis seen  There was insufficient TR detected to calculate RV systolic pressure.  Estimated right atrial pressure is 5 mmHg..  Mild pulmonic valvular regurgitation.  There is no  pericardial effusion.  Probably no significant change in comparison with the prior study noted   EKG:  EKG not ordered today  Recent Labs: No results found for requested labs within last 365 days.   Recent Lipid Panel    Component Value Date/Time   CHOL 179 12/08/2022 1024   TRIG 71 12/08/2022 1024   TRIG 93 10/12/2006 1008   HDL 83 12/08/2022 1024   CHOLHDL 2.2 12/08/2022 1024   CHOLHDL 3 05/01/2022 0958   VLDL 20.0 05/01/2022 0958   LDLCALC 83 12/08/2022 1024   LDLDIRECT 131.6 09/16/2012 1131     Risk Assessment/Calculations:           Physical Exam:    VS:  BP (!) 142/76   Pulse 81   Ht 5\' 3"  (1.6 m)   Wt 123 lb 6.4 oz (56 kg)   SpO2 98%   BMI 21.86 kg/m     Wt Readings from Last 3 Encounters:  12/23/22 123 lb 6.4 oz (56 kg)  10/13/22 122 lb 3.2 oz (55.4 kg)  09/02/22 123 lb (55.8 kg)     GEN: Comfortable, NAD HEENT: Normal NECK: No JVD; No carotid bruits CARDIAC: RRR, 2/6 early systolic murmur. No rubs or gallops RESPIRATORY:  Clear to auscultation without rales, wheezing or rhonchi  ABDOMEN: Soft, non-tender, non-distended MUSCULOSKELETAL:  No edema; No deformity  SKIN: Warm and dry NEUROLOGIC:  Alert and oriented x 3 PSYCHIATRIC:  Normal affect   ASSESSMENT:    1. Chronic systolic heart failure ([place])   2. Medication management   3. Coronary artery disease involving native coronary artery of native heart without angina pectoris   4. S/P aortic valve replacement with porcine valve   5. Heart failure, unspecified HF chronicity, unspecified heart failure type ([place])   6. Severe aortic stenosis   7. Mixed hyperlipidemia     PLAN:    In order of problems listed above:  #Chronic Systolic HF with mildly Reduced EF: TTE with LVEF 40-45% with septal akinesis, G1DD, normal RV, normal functioning aortic valve prosthesis. EF down slightly from 50-55% in 2019. Currently patient feels well without anginal symptoms. Given mild drop in EF, will check myoview  to ensure no ischemia. Will also start entresto for GDMT. No BB due to allergy -Check myoview -No BB due to allergy (caused itching) -Start entresto 24/26mg  BID -BMET next week  #Severe AS s/p AVR in 2010: Well  functioning prosthesis on TTE in 11/2022 with mean gradient 33mmHg. -Continue ASA 81mg  daily -Dental ppx  #CAD s/p LIMA to LAD: -Check myoview as above -Continue ASA 81mg  daily -Continue lipitor 40mg  daily  #IVCD: -Chronic  #HLD: -Continue lipitor 40mg  daily -Repeat lipids in 6 months after lifestyle modifications; goal LDL<70         Follow-up: 1 year.  Medication Adjustments/Labs and Tests Ordered: Current medicines are reviewed at length with the patient today.  Concerns regarding medicines are outlined above.   Orders Placed This Encounter  Procedures   Basic metabolic panel   MYOCARDIAL PERFUSION IMAGING   Meds ordered this encounter  Medications   sacubitril-valsartan (ENTRESTO) 24-26 MG    Sig: Take 1 tablet by mouth 2 (two) times daily.    Dispense:  60 tablet    Refill:  2    Please Honor Card patient is presenting for Whitney Manning: [id number]; Whitney [id number]; [id number]: [facility]; [id number]   Patient Instructions  Medication Instructions:   START TAKING ENTRESTO 24/26 MG DOSE--TAKE ONE TABLET BY MOUTH TWICE DAILY  *If you need a refill on your cardiac medications before your next appointment, please call your pharmacy*   Lab Work:  IN [facility] [place] OFFICE--BMET  If you have labs (blood work) drawn today and your tests are completely normal, you will receive your results only by: [place] (if you have MyChart) OR A paper copy in the mail If you have any lab test that is abnormal or we need to change your treatment, we will call you to review the results.   Testing/Procedures:  Your physician has requested that you have a lexiscan myoview. For further information please visit [email or web address]. Please follow instruction  sheet, as given.    Follow-Up: At [facility], you and your health needs are our priority.  As part of our continuing mission to provide you with exceptional heart care, we have created designated Provider Care Teams.  These Care Teams include your primary Cardiologist (physician) and Advanced Practice Providers (APPs -  Physician Assistants and Nurse Practitioners) who all work together to provide you with the care you need, when you need it.  We recommend signing up for the patient portal called "MyChart".  Sign up information is provided on this After Visit Summary.  MyChart is used to connect with patients for Virtual Visits (Telemedicine).  Patients are able to view lab/test results, encounter notes, upcoming appointments, etc.  Non-urgent messages can be sent to your provider as well.   To learn more about what you can do with MyChart, go to [email or web address].    Your next appointment:   6 month(s)  Provider:   DR. Johney Frame      Signed, Freada Bergeron, MD  12/23/2022 10:06 AM    [place]

## 2022-12-23 ENCOUNTER — Ambulatory Visit: Payer: Medicare Other | Attending: Cardiology | Admitting: Cardiology

## 2022-12-23 ENCOUNTER — Encounter: Payer: Self-pay | Admitting: Cardiology

## 2022-12-23 ENCOUNTER — Encounter: Payer: Self-pay | Admitting: *Deleted

## 2022-12-23 VITALS — BP 142/76 | HR 81 | Ht 63.0 in | Wt 123.4 lb

## 2022-12-23 DIAGNOSIS — I509 Heart failure, unspecified: Secondary | ICD-10-CM

## 2022-12-23 DIAGNOSIS — I350 Nonrheumatic aortic (valve) stenosis: Secondary | ICD-10-CM

## 2022-12-23 DIAGNOSIS — I5022 Chronic systolic (congestive) heart failure: Secondary | ICD-10-CM | POA: Diagnosis not present

## 2022-12-23 DIAGNOSIS — Z953 Presence of xenogenic heart valve: Secondary | ICD-10-CM | POA: Diagnosis not present

## 2022-12-23 DIAGNOSIS — I2510 Atherosclerotic heart disease of native coronary artery without angina pectoris: Secondary | ICD-10-CM | POA: Diagnosis not present

## 2022-12-23 DIAGNOSIS — E782 Mixed hyperlipidemia: Secondary | ICD-10-CM

## 2022-12-23 DIAGNOSIS — Z79899 Other long term (current) drug therapy: Secondary | ICD-10-CM | POA: Diagnosis not present

## 2022-12-23 MED ORDER — ENTRESTO 24-26 MG PO TABS: 1.0000 | ORAL_TABLET | Freq: Two times a day (BID) | ORAL | 2 refills | Status: DC

## 2022-12-23 NOTE — Patient Instructions (Signed)
Medication Instructions:   START TAKING ENTRESTO 24/26 MG DOSE--TAKE ONE TABLET BY MOUTH TWICE DAILY  *If you need a refill on your cardiac medications before your next appointment, please call your pharmacy*   Lab Work:  IN 7-14 [place] OFFICE--BMET  If you have labs (blood work) drawn today and your tests are completely normal, you will receive your results only by: [place] (if you have MyChart) OR A paper copy in the mail If you have any lab test that is abnormal or we need to change your treatment, we will call you to review the results.   Testing/Procedures:  Your physician has requested that you have a lexiscan myoview. For further information please visit [email or web address]. Please follow instruction sheet, as given.    Follow-Up: At [facility], you and your health needs are our priority.  As part of our continuing mission to provide you with exceptional heart care, we have created designated Provider Care Teams.  These Care Teams include your primary Cardiologist (physician) and Advanced Practice Providers (APPs -  Physician Assistants and Nurse Practitioners) who all work together to provide you with the care you need, when you need it.  We recommend signing up for the patient portal called "MyChart".  Sign up information is provided on this After Visit Summary.  MyChart is used to connect with patients for Virtual Visits (Telemedicine).  Patients are able to view lab/test results, encounter notes, upcoming appointments, etc.  Non-urgent messages can be sent to your provider as well.   To learn more about what you can do with MyChart, go to [email or web address].    Your next appointment:   6 month(s)  Provider:   DR. Johney Frame

## 2022-12-24 ENCOUNTER — Telehealth (HOSPITAL_COMMUNITY): Payer: Self-pay | Admitting: *Deleted

## 2022-12-24 NOTE — Telephone Encounter (Signed)
Left generic message for pt to return call

## 2022-12-25 ENCOUNTER — Telehealth (HOSPITAL_COMMUNITY): Payer: Self-pay | Admitting: *Deleted

## 2022-12-25 NOTE — Telephone Encounter (Signed)
Close encounter 

## 2023-01-01 ENCOUNTER — Ambulatory Visit: Payer: Medicare Other

## 2023-01-01 ENCOUNTER — Ambulatory Visit (HOSPITAL_COMMUNITY): Payer: Medicare Other | Attending: Cardiology

## 2023-01-01 ENCOUNTER — Telehealth: Payer: Self-pay | Admitting: *Deleted

## 2023-01-01 DIAGNOSIS — I509 Heart failure, unspecified: Secondary | ICD-10-CM

## 2023-01-01 DIAGNOSIS — Z953 Presence of xenogenic heart valve: Secondary | ICD-10-CM | POA: Diagnosis not present

## 2023-01-01 DIAGNOSIS — I2510 Atherosclerotic heart disease of native coronary artery without angina pectoris: Secondary | ICD-10-CM

## 2023-01-01 DIAGNOSIS — Z79899 Other long term (current) drug therapy: Secondary | ICD-10-CM | POA: Diagnosis present

## 2023-01-01 LAB — MYOCARDIAL PERFUSION IMAGING
LV dias vol: 51 mL (ref 46–106)
LV sys vol: 22 mL
Nuc Stress EF: 58 %
Peak HR: 96 {beats}/min
Rest HR: 77 {beats}/min
Rest Nuclear Isotope Dose: 10.9 mCi
SDS: 4
SRS: 1
SSS: 5
ST Depression (mm): 0 mm
Stress Nuclear Isotope Dose: 32.4 mCi
TID: 0.97

## 2023-01-01 MED ORDER — TECHNETIUM TC 99M TETROFOSMIN IV KIT
10.9000 | PACK | Freq: Once | INTRAVENOUS | Status: AC | PRN
  Administered 2023-01-01: 10.9 via INTRAVENOUS

## 2023-01-01 MED ORDER — REGADENOSON 0.4 MG/5ML IV SOLN
0.4000 mg | Freq: Once | INTRAVENOUS | Status: AC
  Administered 2023-01-01: 0.4 mg via INTRAVENOUS

## 2023-01-01 MED ORDER — TECHNETIUM TC 99M TETROFOSMIN IV KIT
32.4000 | PACK | Freq: Once | INTRAVENOUS | Status: AC | PRN
  Administered 2023-01-01: 32.4 via INTRAVENOUS

## 2023-01-01 NOTE — Telephone Encounter (Signed)
-----   Message from Freada Bergeron, MD sent at 01/01/2023  4:49 PM EDT ----- Her stress test looks great. This is great news! No further testing needed at this time and will continue with the medications.  How is she doing on the entresto?

## 2023-01-01 NOTE — Telephone Encounter (Signed)
Noted placed in her medication list about cutting entresto in 1/2 per Dr. Johney Frame.

## 2023-01-01 NOTE — Telephone Encounter (Signed)
  [name], LPN [id number]  [id number] PM EDT Back to Top    Spoke with Dr. Johney Frame on the phone and she advised that the pt can cut her Entresto in 1/2 and if the dizziness doesn't go away, then we can stop it.     Endorsed this to the pt and she is aware that if the dizziness continues to occur with this, then to let us know and we can stop the [facility].   Pt verbalized understanding and agrees with this plan.   [name], LPN [id number]  [id number] PM EDT     The patient has been notified of the result and verbalized understanding.  All questions (if any) were answered.   Asked the pt how she is doing on the entresto and she said last couple morning when she wakes up, she is feeling dizzy.  She said the dose may need to be reduced.  She said she monitored her AM BP's for 2 days and on 3/26 was 95/66 and 3/27 was 100/65.  She said she is tolerating PM dose well (has no pressures to report then).  She said Dr. Johney Frame said if she experienced this, she could always cut the dose in 1/2 to see if that helps.  She is aware that I will reach out to Dr. Johney Frame for further instruction about this.  She is aware that a triage nurse will follow-up with her accordingly thereafter.   Pt verbalized understanding and agrees with this plan

## 2023-01-02 LAB — BASIC METABOLIC PANEL
BUN/Creatinine Ratio: 20 (ref 12–28)
BUN: 18 mg/dL (ref 8–27)
CO2: 22 mmol/L (ref 20–29)
Calcium: 9.6 mg/dL (ref 8.7–10.3)
Chloride: 103 mmol/L (ref 96–106)
Creatinine, Ser: 0.88 mg/dL (ref 0.57–1.00)
Glucose: 94 mg/dL (ref 70–99)
Potassium: 4.4 mmol/L (ref 3.5–5.2)
Sodium: 142 mmol/L (ref 134–144)
eGFR: 64 mL/min/{1.73_m2} (ref >59)

## 2023-01-05 ENCOUNTER — Telehealth: Payer: Self-pay | Admitting: Cardiology

## 2023-01-05 MED ORDER — LOSARTAN POTASSIUM 25 MG PO TABS: 12.5000 mg | ORAL_TABLET | Freq: Every day | ORAL | 3 refills | Status: DC

## 2023-01-05 NOTE — Telephone Encounter (Signed)
Pt c/o medication issue:  1. Name of Medication:   sacubitril-valsartan (ENTRESTO) 24-26 MG    2. How are you currently taking this medication (dosage and times per day)?    3. Are you having a reaction (difficulty breathing--STAT)? no  4. What is your medication issue? Patient states this medication would make her dizzy. Would like to put on a different medication. Please advise

## 2023-01-05 NOTE — Telephone Encounter (Signed)
Patient stated she called last week and complain of dizziness since taking entresto. She was advised to decrease the medication however, patient was still experiencing dizziness. Patient stated she stop taking the medication last week Thursday/Friday. She has not experienced dizziness since discontinuing the medication. BP this AM 138/90. Will forward to MD and nurse.

## 2023-01-05 NOTE — Telephone Encounter (Signed)
Called and spoke with patient.   Per Dr. Johney Frame: Stop Delene Loll. Start Losartan 12.5mg  daily at bedtime.  Medication list updated. Losartan sent to pharmacy of choice.   Instructed patient to notify our office if dizziness returns when starting Losartan.  Patient verbalized understanding and expressed appreciation for call.

## 2023-03-11 ENCOUNTER — Ambulatory Visit: Payer: Medicare Other | Attending: Cardiology

## 2023-03-11 DIAGNOSIS — I2510 Atherosclerotic heart disease of native coronary artery without angina pectoris: Secondary | ICD-10-CM

## 2023-03-11 DIAGNOSIS — E782 Mixed hyperlipidemia: Secondary | ICD-10-CM

## 2023-03-11 DIAGNOSIS — Z79899 Other long term (current) drug therapy: Secondary | ICD-10-CM

## 2023-03-11 LAB — LIPID PANEL
Chol/HDL Ratio: 2 ratio (ref 0.0–4.4)
Cholesterol, Total: 143 mg/dL (ref 100–199)
HDL: 71 mg/dL (ref >39)
LDL Chol Calc (NIH): 58 mg/dL (ref 0–99)
Triglycerides: 67 mg/dL (ref 0–149)
VLDL Cholesterol Cal: 14 mg/dL (ref 5–40)

## 2023-04-10 ENCOUNTER — Encounter (HOSPITAL_COMMUNITY): Payer: Self-pay | Admitting: Student

## 2023-04-10 ENCOUNTER — Encounter: Payer: Self-pay | Admitting: Family Medicine

## 2023-04-10 ENCOUNTER — Emergency Department (HOSPITAL_COMMUNITY): Payer: Medicare Other

## 2023-04-10 ENCOUNTER — Inpatient Hospital Stay (HOSPITAL_COMMUNITY)
Admission: EM | Admit: 2023-04-10 | Discharge: 2023-04-14 | DRG: 243 | Disposition: A | Discharge status: Home / Self Care | Payer: Medicare Other | Source: Ambulatory Visit | Attending: Cardiovascular Disease | Admitting: Cardiovascular Disease

## 2023-04-10 ENCOUNTER — Telehealth: Payer: Self-pay | Admitting: Cardiology

## 2023-04-10 ENCOUNTER — Ambulatory Visit: Payer: Medicare Other | Admitting: Family Medicine

## 2023-04-10 VITALS — BP 122/70 | HR 40 | Temp 97.7°F | Ht 63.0 in | Wt 122.0 lb

## 2023-04-10 DIAGNOSIS — I350 Nonrheumatic aortic (valve) stenosis: Secondary | ICD-10-CM

## 2023-04-10 DIAGNOSIS — Z7982 Long term (current) use of aspirin: Secondary | ICD-10-CM

## 2023-04-10 DIAGNOSIS — R001 Bradycardia, unspecified: Secondary | ICD-10-CM

## 2023-04-10 DIAGNOSIS — I429 Cardiomyopathy, unspecified: Secondary | ICD-10-CM | POA: Diagnosis present

## 2023-04-10 DIAGNOSIS — I2510 Atherosclerotic heart disease of native coronary artery without angina pectoris: Secondary | ICD-10-CM

## 2023-04-10 DIAGNOSIS — R202 Paresthesia of skin: Secondary | ICD-10-CM | POA: Diagnosis not present

## 2023-04-10 DIAGNOSIS — I441 Atrioventricular block, second degree: Secondary | ICD-10-CM | POA: Diagnosis not present

## 2023-04-10 DIAGNOSIS — I5022 Chronic systolic (congestive) heart failure: Secondary | ICD-10-CM | POA: Diagnosis not present

## 2023-04-10 DIAGNOSIS — E782 Mixed hyperlipidemia: Secondary | ICD-10-CM

## 2023-04-10 DIAGNOSIS — Z953 Presence of xenogenic heart valve: Secondary | ICD-10-CM

## 2023-04-10 DIAGNOSIS — H9192 Unspecified hearing loss, left ear: Secondary | ICD-10-CM | POA: Diagnosis present

## 2023-04-10 DIAGNOSIS — Z951 Presence of aortocoronary bypass graft: Secondary | ICD-10-CM

## 2023-04-10 DIAGNOSIS — Z902 Acquired absence of lung [part of]: Secondary | ICD-10-CM

## 2023-04-10 DIAGNOSIS — I110 Hypertensive heart disease with heart failure: Secondary | ICD-10-CM | POA: Diagnosis present

## 2023-04-10 DIAGNOSIS — Z8249 Family history of ischemic heart disease and other diseases of the circulatory system: Secondary | ICD-10-CM

## 2023-04-10 DIAGNOSIS — M8580 Other specified disorders of bone density and structure, unspecified site: Secondary | ICD-10-CM | POA: Diagnosis present

## 2023-04-10 DIAGNOSIS — Z888 Allergy status to other drugs, medicaments and biological substances status: Secondary | ICD-10-CM

## 2023-04-10 DIAGNOSIS — Z9049 Acquired absence of other specified parts of digestive tract: Secondary | ICD-10-CM

## 2023-04-10 DIAGNOSIS — E785 Hyperlipidemia, unspecified: Secondary | ICD-10-CM | POA: Diagnosis present

## 2023-04-10 DIAGNOSIS — Z85118 Personal history of other malignant neoplasm of bronchus and lung: Secondary | ICD-10-CM

## 2023-04-10 DIAGNOSIS — Z85818 Personal history of malignant neoplasm of other sites of lip, oral cavity, and pharynx: Secondary | ICD-10-CM

## 2023-04-10 DIAGNOSIS — Z823 Family history of stroke: Secondary | ICD-10-CM

## 2023-04-10 DIAGNOSIS — Z79899 Other long term (current) drug therapy: Secondary | ICD-10-CM

## 2023-04-10 LAB — CBC WITH DIFFERENTIAL/PLATELET
Abs Immature Granulocytes: 0.02 10*3/uL (ref 0.00–0.07)
Basophils Absolute: 0 10*3/uL (ref 0.0–0.1)
Basophils Relative: 1 %
Eosinophils Absolute: 0.1 10*3/uL (ref 0.0–0.5)
Eosinophils Relative: 1 %
HCT: 37.8 % (ref 36.0–46.0)
Hemoglobin: 12.9 g/dL (ref 12.0–15.0)
Immature Granulocytes: 0 %
Lymphocytes Relative: 21 %
Lymphs Abs: 1.3 10*3/uL (ref 0.7–4.0)
MCH: 32.1 pg (ref 26.0–34.0)
MCHC: 34.1 g/dL (ref 30.0–36.0)
MCV: 94 fL (ref 80.0–100.0)
Monocytes Absolute: 0.4 10*3/uL (ref 0.1–1.0)
Monocytes Relative: 7 %
Neutro Abs: 4.4 10*3/uL (ref 1.7–7.7)
Neutrophils Relative %: 70 %
Platelets: 185 10*3/uL (ref 150–400)
RBC: 4.02 MIL/uL (ref 3.87–5.11)
RDW: 13.3 % (ref 11.5–15.5)
WBC: 6.2 10*3/uL (ref 4.0–10.5)
nRBC: 0 % (ref 0.0–0.2)

## 2023-04-10 LAB — URINALYSIS, ROUTINE W REFLEX MICROSCOPIC
Bilirubin Urine: NEGATIVE
Glucose, UA: NEGATIVE mg/dL
Hgb urine dipstick: NEGATIVE
Ketones, ur: NEGATIVE mg/dL
Leukocytes,Ua: NEGATIVE
Nitrite: NEGATIVE
Protein, ur: NEGATIVE mg/dL
Specific Gravity, Urine: 1.008 (ref 1.005–1.030)
pH: 7 (ref 5.0–8.0)

## 2023-04-10 LAB — CBC
HCT: 35.2 % — ABNORMAL LOW (ref 36.0–46.0)
Hemoglobin: 11.9 g/dL — ABNORMAL LOW (ref 12.0–15.0)
MCH: 31.1 pg (ref 26.0–34.0)
MCHC: 33.8 g/dL (ref 30.0–36.0)
MCV: 91.9 fL (ref 80.0–100.0)
Platelets: 164 10*3/uL (ref 150–400)
RBC: 3.83 MIL/uL — ABNORMAL LOW (ref 3.87–5.11)
RDW: 13.2 % (ref 11.5–15.5)
WBC: 6 10*3/uL (ref 4.0–10.5)
nRBC: 0 % (ref 0.0–0.2)

## 2023-04-10 LAB — COMPREHENSIVE METABOLIC PANEL
ALT: 27 U/L (ref 0–44)
AST: 33 U/L (ref 15–41)
Albumin: 3.9 g/dL (ref 3.5–5.0)
Alkaline Phosphatase: 65 U/L (ref 38–126)
Anion gap: 10 (ref 5–15)
BUN: 16 mg/dL (ref 8–23)
CO2: 22 mmol/L (ref 22–32)
Calcium: 9 mg/dL (ref 8.9–10.3)
Chloride: 107 mmol/L (ref 98–111)
Creatinine, Ser: 1.05 mg/dL — ABNORMAL HIGH (ref 0.44–1.00)
GFR, Estimated: 52 mL/min — ABNORMAL LOW (ref >60)
Glucose, Bld: 101 mg/dL — ABNORMAL HIGH (ref 70–99)
Potassium: 3.8 mmol/L (ref 3.5–5.1)
Sodium: 139 mmol/L (ref 135–145)
Total Bilirubin: 1 mg/dL (ref 0.3–1.2)
Total Protein: 6.6 g/dL (ref 6.5–8.1)

## 2023-04-10 LAB — CREATININE, SERUM
Creatinine, Ser: 1.11 mg/dL — ABNORMAL HIGH (ref 0.44–1.00)
GFR, Estimated: 49 mL/min — ABNORMAL LOW (ref >60)

## 2023-04-10 LAB — TROPONIN I (HIGH SENSITIVITY)
Troponin I (High Sensitivity): 16 ng/L (ref <18)
Troponin I (High Sensitivity): 21 ng/L — ABNORMAL HIGH (ref <18)

## 2023-04-10 LAB — BRAIN NATRIURETIC PEPTIDE: B Natriuretic Peptide: 768.4 pg/mL — ABNORMAL HIGH (ref 0.0–100.0)

## 2023-04-10 MED ORDER — ONDANSETRON HCL 4 MG/2ML IJ SOLN: 4.0000 mg | Freq: Four times a day (QID) | INTRAMUSCULAR | Status: DC | PRN

## 2023-04-10 MED ORDER — ATROPINE SULFATE 1 MG/ML IV SOLN: 0.4000 mg | INTRAVENOUS | Status: DC | PRN

## 2023-04-10 MED ORDER — EZETIMIBE 10 MG PO TABS
10.0000 mg | ORAL_TABLET | Freq: Every day | ORAL | Status: DC
  Administered 2023-04-11 – 2023-04-14 (×4): 10 mg via ORAL
  Filled 2023-04-10 (×4): qty 1

## 2023-04-10 MED ORDER — ASPIRIN 81 MG PO TBEC
81.0000 mg | DELAYED_RELEASE_TABLET | Freq: Every day | ORAL | Status: DC
  Administered 2023-04-10 – 2023-04-11 (×2): 81 mg via ORAL
  Filled 2023-04-10 (×2): qty 1

## 2023-04-10 MED ORDER — ATORVASTATIN CALCIUM 40 MG PO TABS
40.0000 mg | ORAL_TABLET | Freq: Every day | ORAL | Status: DC
  Administered 2023-04-11 – 2023-04-14 (×4): 40 mg via ORAL
  Filled 2023-04-10 (×4): qty 1

## 2023-04-10 MED ORDER — HEPARIN SODIUM (PORCINE) 5000 UNIT/ML IJ SOLN
5000.0000 [IU] | Freq: Three times a day (TID) | INTRAMUSCULAR | Status: AC
  Administered 2023-04-10 – 2023-04-12 (×7): 5000 [IU] via SUBCUTANEOUS
  Filled 2023-04-10 (×7): qty 1

## 2023-04-10 MED ORDER — ACETAMINOPHEN 325 MG PO TABS
650.0000 mg | ORAL_TABLET | ORAL | Status: DC | PRN
  Administered 2023-04-14: 650 mg via ORAL
  Filled 2023-04-10: qty 2

## 2023-04-10 NOTE — H&P (Addendum)
Cardiology Admission History and Physical   Patient ID: TYLENE Manning MRN: [id number]; DOB: 1935-05-09   Admission date: 04/10/2023  PCP:  Joaquim Nam, MD   [facility] Providers Cardiologist:  Meriam Sprague, MD   {  Chief Complaint:  bradycardia/dizziness, 2:1 heart block   Patient Profile:   Whitney Manning is a 88 y.o. female with hypertension, hyperlipidemia, lung cancer status post RUL lobectomy and XRT, severe aortic stenosis status post AVR, aortic arch reconstruction, chronic HFrEF who is being seen 04/10/2023 for the evaluation of symptomatic bradycardia secondary to 2:1 heart block.  History of Present Illness:   [name] is currently followed by Dr. Shari Prows for severe aortic stenosis with AVR in 04/2009.  Also had ascending aortic arch reconstruction with single-vessel bypass with a LIMA to LAD during same time.  Last echocardiogram was in March 2024, LVEF 40 to 45% with well-functioning aortic valve prosthesis with a mean gradient of 2.Had subsequent normal Myoview for cardiomyopathy and was started on Entresto, but stopped due to hyopotension. Also not able to tolerate BB due to itchiness.  Myoview showed LVEF of 50%. At her at last office visit patient had no complaints and was doing well.    Currently patient is being seen due to bradycardia noted at outpatient facility.  Heart rate was 38, she showed 2:1 heart block.  On-call cardiologist was notified and patient was advised to go to the hospital.  Patient states that for the past 1 week she has had some dizziness without any other associated symptoms.  She states that these episodes would occur intermittently and she would have to lean against an object.  She denies falling, beta-blocker use, chest pain, syncopal episodes.  No other significant labs or imaging.   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)   CHOLECYSTECTOMY     COLONOSCOPY     LUNG LOBECTOMY  2016   at [facility], for pulmonary nodule   salivary gland surgery for cancer on the left parotid       Medications Prior to Admission: Prior to Admission medications   Medication Sig Start Date End Date Taking? Authorizing Provider  aspirin EC 81 MG tablet Take 1 tablet (81 mg total) by mouth daily. Swallow whole. 10/13/22   Meriam Sprague, MD  atorvastatin (LIPITOR) 40 MG tablet Take 1 tablet (40 mg total) by mouth daily. 10/13/22   Meriam Sprague, MD  Cholecalciferol (VITAMIN D) 2000 UNITS CAPS Take 1 capsule (2,000 Units total) by mouth daily. 02/25/11   Stacie Glaze, MD  co-enzyme Q-10 30 MG capsule Take 100 mg by mouth every other day.     [provider]  ezetimibe (ZETIA) 10 MG tablet Take 1 tablet (10 mg total) by mouth daily. 12/09/22   Meriam Sprague, MD  fish oil-omega-3 fatty acids 1000 MG capsule Take 2 g by mouth 2 (two) times daily.      [provider]  losartan (COZAAR) 25 MG tablet Take 0.5 tablets (12.5 mg total) by mouth at bedtime.  01/05/23   Meriam Sprague, MD  triamcinolone cream (KENALOG) 0.1 % Apply 1 application topically 2 (two) times daily as needed. For itching skin 06/03/21   Joaquim Nam, MD     Allergies:    Allergies  Allergen Reactions   Entresto [Sacubitril-Valsartan]     dizziness   Fosamax [Alendronate Sodium] Other (See Comments)    Nosebleeds.     Metoprolol     Possible cause of itching.      Social History:   Social History   Socioeconomic History   Marital status: Married    Spouse name: Not on file   Number of children: Not on file   Years of education: Not on file   Highest education level: Not  on file  Occupational History   Not on file  Tobacco Use   Smoking status: Never   Smokeless tobacco: Never  Substance and Sexual Activity   Alcohol use: Yes    Comment: rare   Drug use: No   Sexual activity: Yes  Other Topics Concern   Not on file  Social History Narrative   All siblings deceased   First husband died of colon cancer.  Remarried 1998.    1 son and 1 grandson   Retired, prev worked with Community education officer, then at [employer]   Social Determinants of [employer]: Low Risk  (04/30/2022)   Overall Financial Resource Strain (CARDIA)    Difficulty of Paying Living Expenses: Not hard at all  Food Insecurity: No Food Insecurity (04/30/2022)   Hunger Vital Sign    Worried About Running Out of Food in the Last Year: Never true    Ran Out of Food in the Last Year: Never true  Transportation Needs: No Transportation Needs (04/30/2022)   PRAPARE - Administrator, Civil Service (Medical): No    Lack of Transportation (Non-Medical): No  Physical Activity: Sufficiently Active (04/30/2022)   Exercise Vital Sign    Days of Exercise per Week: 5 days    Minutes of Exercise per Session: 30 min  Stress: No Stress Concern Present (04/30/2022)   [employer] of Occupational Health - Occupational Stress Questionnaire    Feeling of Stress : Not at all  Social Connections: Moderately Integrated (04/30/2022)   Social Connection and Isolation Panel [NHANES]    Frequency of Communication with Friends and Family: More than three times a week    Frequency of Social Gatherings with Friends and Family: Once a week    Attends Religious Services: More than 4 times per year    Active Member of [employer] or Organizations: No    Attends Banker Meetings: Never    Marital Status: Married  Catering manager Violence: Not At Risk (04/30/2022)   Humiliation, Afraid, Rape, and Kick questionnaire    Fear of Current or Ex-Partner: No    Emotionally Abused: No     Physically Abused: No    Sexually Abused: No    Family History:  The patient's family history includes Heart disease in her father; Stroke in her mother. There is no history of Breast cancer or Colon cancer.    ROS:  Please see the history of present illnessAll other ROS reviewed and negative.     Physical Exam/Data:   Vitals:   04/10/23 1512 04/10/23 1517  BP:  (!) 167/46  Pulse:  (!) 39  Resp:  12  SpO2:  100%  Weight: 55 kg   Height: 5'  3" (1.6 m)    No intake or output data in the 24 hours ending 04/10/23 1559    04/10/2023    3:12 PM 04/10/2023   11:52 AM 01/01/2023    7:19 AM  Last 3 Weights  Weight (lbs) 121 lb 4.1 oz 122 lb 123 lb  Weight (kg) 55 kg 55.339 kg 55.792 kg     Body mass index is 21.48 kg/m.  General:  Well nourished, well developed, in no acute distress HEENT: normal Neck: + JVD Vascular: No carotid bruits; Distal pulses 2+ bilaterally   Cardiac: Bradycardic Lungs:  clear to auscultation bilaterally, no wheezing, rhonchi or rales  Abd: soft, nontender, no hepatomegaly  Ext: no edema Musculoskeletal:  No deformities, BUE and BLE strength normal and equal Skin: warm and dry  Neuro:  CNs 2-12 intact, no focal abnormalities noted Psych:  Normal affect    EKG:  The ECG that was done was personally reviewed and demonstrates 2-1 AV block.  Heart rate 37.  Left bundle branch block.  Relevant CV Studies: Echocardiogram 12/08/2022 1. Left ventricular ejection fraction, by estimation, is 40 to 45%. The  left ventricle has mildly decreased function. The left ventricle  demonstrates regional wall motion abnormalities (see scoring  diagram/findings for description). Septal dyskinesis.  Left ventricular diastolic parameters are consistent with Grade I  diastolic dysfunction (impaired relaxation).   2. Right ventricular systolic function is normal. The right ventricular  size is normal. Tricuspid regurgitation signal is inadequate for assessing  PA pressure.    3. The mitral valve is degenerative. Trivial mitral valve regurgitation.  Severe mitral annular calcification.   4. The aortic valve has been repaired/replaced. Aortic valve  regurgitation is trivial. There is a bioprosthetic valve present in the  aortic position. Procedure Date: 2010. Echo findings are consistent with  normal structure and function of the aortic  valve prosthesis. Aortic valve mean gradient measures 2.0 mmHg.   5. The inferior vena cava is normal in size with greater than 50%  respiratory variability, suggesting right atrial pressure of 3 mmHg.   Myocardial perfusion scan  LV perfusion is normal. There is no evidence of ischemia. There is no evidence of infarction. Apical thinning artifact.   Left ventricular function is normal. Nuclear stress EF: 58 %. The left ventricular ejection fraction is normal (55-65%). End diastolic cavity size is normal.   The study is normal. The study is low risk.  Laboratory Data:  High Sensitivity Troponin:  No results for input(s): "TROPONINIHS" in the last 720 hours.    ChemistryNo results for input(s): "NA", "K", "CL", "CO2", "GLUCOSE", "BUN", "CREATININE", "CALCIUM", "MG", "GFRNONAA", "GFRAA", "ANIONGAP" in the last 168 hours.  No results for input(s): "PROT", "ALBUMIN", "AST", "ALT", "ALKPHOS", "BILITOT" in the last 168 hours. Lipids No results for input(s): "CHOL", "TRIG", "HDL", "LABVLDL", "LDLCALC", "CHOLHDL" in the last 168 hours. HematologyNo results for input(s): "WBC", "RBC", "HGB", "HCT", "MCV", "MCH", "MCHC", "RDW", "PLT" in the last 168 hours. Thyroid No results for input(s): "TSH", "FREET4" in the last 168 hours. BNPNo results for input(s): "BNP", "PROBNP" in the last 168 hours.  DDimer No results for input(s): "DDIMER" in the last 168 hours.   Radiology/Studies:  DG Chest 2 View  Result Date: 04/10/2023 CLINICAL DATA:  Bradycardia EXAM: CHEST - 2 VIEW COMPARISON:  Chest x-ray dated December 30, 2019 FINDINGS: Cardiac and  mediastinal contours are within normal limits. Prior median sternotomy. Mild left basilar atelectasis. No evidence of pleural effusion or pneumothorax. IMPRESSION: No active  cardiopulmonary disease. Electronically Signed   By: Allegra Lai M.D.   On: 04/10/2023 15:55     Assessment and Plan:   2:1 AV block Symptomatic bradycardia She is symptomatic with complaints of dizziness for the past 1 week.  Heart rate is around 35.  Denies any falls or episodes of syncope.  No use of beta-blocker or other obvious causes.  Likely will need pacemaker for symptomatic bradycardia.  Will need EP evaluation. Continue close monitoring on telemetry, will order subq pacer just in case.   Chronic HFrEF Echo done in March 2024 showed LVEF of 40 to 45% with septal akinesis but normal functioning aortic valve prosthesis.  Subsequent Myoview did not show any signs of ischemia.  Has no anginal complaints so likely nonischemic.   Overall euvolemic.  On exam did note some mild JVD.  Otherwise no other overt signs of hypervolemia and not complaining of any shortness of breath.  Continue to monitor this, defer diuresis in the setting of symptomatic bradycardia/minimal symptoms. Will order BNP PTA losartan 12.5 mg.  Hold antihypertensives. Not able to tolerate beta-blocker due to itchiness and now heart block Would benefit from SGLT2 inhibitor, once more stable.  Severe AS status post AVR in 2010 Valve functioning properly on echocardiogram in March 2024.  Mean gradient 2.  CAD status post LIMA to LAD Continue aspirin and statin.  No anginal complaints  Hyperlipidemia Continue atorvastatin 40 mg daily and zetia.    Risk Assessment/Risk Scores:     New York Heart Association (NYHA) Functional Class NYHA Class II    Code Status: Full Code  Severity of Illness: The appropriate patient status for this patient is OBSERVATION. Observation status is judged to be reasonable and necessary in order to provide  the required intensity of service to ensure the patient's safety. The patient's presenting symptoms, physical exam findings, and initial radiographic and laboratory data in the context of their medical condition is felt to place them at decreased risk for further clinical deterioration. Furthermore, it is anticipated that the patient will be medically stable for discharge from the hospital within 2 midnights of admission.    For questions or updates, please contact [facility] Please consult [email or web address] for contact info under   Signed, Abagail Kitchens, PA-C  04/10/2023 3:59 PM    Patient seen and examined, note reviewed with the signed Advanced Practice Provider. I personally reviewed laboratory data, imaging studies and relevant notes. I independently examined the patient and formulated the important aspects of the plan. I have personally discussed the plan with the patient and/or family. Comments or changes to the note/plan are indicated below.   Patient seen signed by her bedside in the ED her husband and daughter are at the bedside.  Symptomatic due to 1 AV block Symptomatic bradycardia Chronic heart failure with reduced ejection fraction Severe aortic stenosis status post AVR in 2010 Coronary artery disease Hyperlipidemia   2-1 AV block and appears to be clinically symptomatic -indication for  permanent placement implantation.  Needs to be evaluated by our EP team.  Discussed with the patient and her husband no questions at this time. Will get an echocardiogram to reassess LVEF. Clinically appears to be euvolemic. No anginal symptoms at this time.  Will be admitted for greater than 2 midnight stay.    Thomasene Ripple DO, MS [facility] Attending Cardiologist [facility]  [address] [place], [place] [address] [phone] Website: [email or web address]

## 2023-04-10 NOTE — Telephone Encounter (Signed)
I spoke with patient's PCP about 2-1 heart block with left bundle branch block and symptoms.  I advised referral to emergency department.

## 2023-04-10 NOTE — ED Triage Notes (Signed)
Came in from doctor's office for right foot. Found to be bradycardic in the 40s. Dizziness for 1 week. Alert and oriented x 4.

## 2023-04-10 NOTE — Progress Notes (Signed)
Bradycardia at OV.  Recheck 38.  Sometimes can be lightheaded, not consistently.  No rate limiting meds.  No CP.  She can get occ get some dyspnea upon standing, then it resolves and she can walk normally.  Sleeping on 2 pillows at baseline.    No R foot pain but has a  "funny feeling."  Started about 1 week ago. She prev had pain in R hip but it didn't shoot down to the leg.  No hip pain now.  Feels like the R foot is "going to sleep" but not absent sensation.  H/o similar but that self resolved.   No known injury. Sensation change is constant now.  No R hand or facial sx.  No L sided sx.  No foot drop no weakness.  No back pain.    Meds, vitals, and allergies reviewed.   ROS: Per HPI unless specifically indicated in ROS section   Nad Nat Neck supple, no LA Huston Foley but regular.  Ctab Abd soft, not ttp Normal S/S B feet with normal light touch and monofilament.   Normal DP pulses B.  She has chronic facial changes from prior surgery, not a new neurologic event.  O/w CN2-12 wnl .  S/S wnl B x4 Normal DTRs B.   EKG d/w pt.  2:1 block with bradycardia and LBBB.   Current Outpatient Medications on File Prior to Visit  Medication Sig Dispense Refill   aspirin EC 81 MG tablet Take 1 tablet (81 mg total) by mouth daily. Swallow whole. 90 tablet 3   atorvastatin (LIPITOR) 40 MG tablet Take 1 tablet (40 mg total) by mouth daily. 90 tablet 2   Cholecalciferol (VITAMIN D) 2000 UNITS CAPS Take 1 capsule (2,000 Units total) by mouth daily. 30 capsule    co-enzyme Q-10 30 MG capsule Take 100 mg by mouth every other day.      ezetimibe (ZETIA) 10 MG tablet Take 1 tablet (10 mg total) by mouth daily. 90 tablet 2   fish oil-omega-3 fatty acids 1000 MG capsule Take 2 g by mouth 2 (two) times daily.       losartan (COZAAR) 25 MG tablet Take 0.5 tablets (12.5 mg total) by mouth at bedtime. 45 tablet 3   triamcinolone cream (KENALOG) 0.1 % Apply 1 application topically 2 (two) times daily as needed. For  itching skin 30 g 1   No current facility-administered medications on file prior to visit.   40 minutes were devoted to patient care in this encounter (this includes time spent reviewing the patient's file/history, interviewing and examining the patient, counseling/reviewing plan with patient).

## 2023-04-10 NOTE — ED Notes (Signed)
The pt is alert and oriented no pain laughing with her family

## 2023-04-10 NOTE — Assessment & Plan Note (Signed)
EKG dw pt.  911 called and will send to ER.  Rationale d/w pt.  Not CP.  Not on rate limiting meds.  EKG d/w pt.  2:1 block with bradycardia and LBBB.

## 2023-04-10 NOTE — ED Notes (Signed)
ED TO INPATIENT HANDOFF REPORT  ED Nurse Name and Phone #: [phone]   S Name/Age/Gender Whitney Manning 88 y.o. female Room/Bed: 008C/008C  Code Status   Code Status: Full Code  Home/SNF/Other Home Patient oriented to: self, place, time, and situation Is this baseline? Yes   Triage Complete: Triage complete  Chief Complaint Bradycardia [R00.1]  Triage Note Came in from doctor's office for right foot. Found to be bradycardic in the 40s. Dizziness for 1 week. Alert and oriented x 4.    Allergies Allergies  Allergen Reactions   Entresto [Sacubitril-Valsartan] Other (See Comments)    Dizziness  Lightheaded    Fosamax [Alendronate Sodium] Other (See Comments)    Epistaxis    Lopressor [Metoprolol] Itching    Level of Care/Admitting Diagnosis ED Disposition     ED Disposition  Admit   Condition  --   Comment  Hospital Area: [facility] [[id number]]  Level of Care: Progressive [102]  Admit to Progressive based on following criteria: CARDIOVASCULAR & THORACIC of moderate stability with acute coronary syndrome symptoms/low risk myocardial infarction/hypertensive urgency/arrhythmias/heart failure potentially compromising stability and stable post cardiovascular intervention patients.  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   Past Surgical History:  Procedure  Laterality Date   CARDIAC VALVE REPLACEMENT     aortic valve ( pig)   CHOLECYSTECTOMY     COLONOSCOPY     LUNG LOBECTOMY  2016   at [facility], for pulmonary nodule   salivary gland surgery for cancer on the left parotid       A IV Location/Drains/Wounds Patient Lines/Drains/Airways Status     Active Line/Drains/Airways     Name Placement date Placement time Site Days   Peripheral IV 04/10/23 20 G Anterior;Left;Proximal Antecubital 04/10/23  1508  Antecubital  less than 1            Intake/Output Last 24 hours No intake or output data in the 24 hours ending 04/10/23 1858  Labs/Imaging Results for orders placed or performed during the hospital encounter of 04/10/23 (from the past 48 hour(s))  CBC with Differential     Status: None   Collection Time: 04/10/23  3:15 PM  Result Value Ref Range   WBC 6.2 4.0 - 10.5 K/uL   RBC 4.02 3.87 - 5.11 MIL/uL   Hemoglobin 12.9 12.0 - 15.0 g/dL   HCT 10.2 72.5 - 36.6 %   MCV 94.0 80.0 - 100.0 fL   MCH 32.1 26.0 - 34.0 pg   MCHC 34.1 30.0 - 36.0 g/dL   RDW 44.0 34.7 - 42.5 %   Platelets 185 150 - 400 K/uL   nRBC 0.0 0.0 - 0.2 %   Neutrophils Relative % 70 %   Neutro Abs 4.4 1.7 - 7.7 K/uL   Lymphocytes Relative 21 %   Lymphs Abs 1.3 0.7 -  4.0 K/uL   Monocytes Relative 7 %   Monocytes Absolute 0.4 0.1 - 1.0 K/uL   Eosinophils Relative 1 %   Eosinophils Absolute 0.1 0.0 - 0.5 K/uL   Basophils Relative 1 %   Basophils Absolute 0.0 0.0 - 0.1 K/uL   Immature Granulocytes 0 %   Abs Immature Granulocytes 0.02 0.00 - 0.07 K/uL    Comment: Performed at [facility], [address], [place], [place] [address]  Comprehensive metabolic panel     Status: Abnormal   Collection Time: 04/10/23  3:15 PM  Result Value Ref Range   Sodium 139 135 - 145 mmol/L   Potassium 3.8 3.5 - 5.1 mmol/L   Chloride 107 98 - 111 mmol/L   CO2 22 22 - 32 mmol/L   Glucose, Bld 101 (H) 70 - 99 mg/dL    Comment: Glucose reference range applies only to samples  taken after fasting for at least 8 hours.   BUN 16 8 - 23 mg/dL   Creatinine, Ser 0.98 (H) 0.44 - 1.00 mg/dL   Calcium 9.0 8.9 - 11.9 mg/dL   Total Protein 6.6 6.5 - 8.1 g/dL   Albumin 3.9 3.5 - 5.0 g/dL   AST 33 15 - 41 U/L   ALT 27 0 - 44 U/L   Alkaline Phosphatase 65 38 - 126 U/L   Total Bilirubin 1.0 0.3 - 1.2 mg/dL   GFR, Estimated 52 (L) >60 mL/min    Comment: (NOTE) Calculated using the CKD-EPI Creatinine Equation (2021)    Anion gap 10 5 - 15    Comment: Performed at [facility], [address], [place], [place] [address]  Troponin I (High Sensitivity)     Status: None   Collection Time: 04/10/23  3:15 PM  Result Value Ref Range   Troponin I (High Sensitivity) 16 <18 ng/L    Comment: (NOTE) Elevated high sensitivity troponin I (hsTnI) values and significant  changes across serial measurements may suggest ACS but many other  chronic and acute conditions are known to elevate hsTnI results.  Refer to the "Links" section for chest pain algorithms and additional  guidance. Performed at [facility], [address] [place], [place] [address]   Urinalysis, Routine w reflex microscopic -Urine, Clean Catch     Status: Abnormal   Collection Time: 04/10/23  5:35 PM  Result Value Ref Range   Color, Urine STRAW (A) YELLOW   APPearance CLEAR CLEAR   Specific Gravity, Urine 1.008 1.005 - 1.030   pH 7.0 5.0 - 8.0   Glucose, UA NEGATIVE NEGATIVE mg/dL   Hgb urine dipstick NEGATIVE NEGATIVE   Bilirubin Urine NEGATIVE NEGATIVE   Ketones, ur NEGATIVE NEGATIVE mg/dL   Protein, ur NEGATIVE NEGATIVE mg/dL   Nitrite NEGATIVE NEGATIVE   Leukocytes,Ua NEGATIVE NEGATIVE    Comment: Performed at [facility], [address] [place], [place] [address]  CBC     Status: Abnormal   Collection Time: 04/10/23  6:03 PM  Result Value Ref Range   WBC 6.0 4.0 - 10.5 K/uL   RBC 3.83 (L) 3.87 - 5.11 MIL/uL   Hemoglobin 11.9 (L) 12.0 - 15.0 g/dL   HCT 78.4 (L) 69.6 - 29.5 %    MCV 91.9 80.0 - 100.0 fL   MCH 31.1 26.0 - 34.0 pg   MCHC 33.8 30.0 - 36.0 g/dL   RDW 28.4 13.2 - 44.0 %   Platelets 164 150 - 400 K/uL  nRBC 0.0 0.0 - 0.2 %    Comment: Performed at [facility], [address] [place], [place] [address]   DG Chest 2 View  Result Date: 04/10/2023 CLINICAL DATA:  Bradycardia EXAM: CHEST - 2 VIEW COMPARISON:  Chest x-ray dated December 30, 2019 FINDINGS: Cardiac and mediastinal contours are within normal limits. Prior median sternotomy. Mild left basilar atelectasis. No evidence of pleural effusion or pneumothorax. IMPRESSION: No active cardiopulmonary disease. Electronically Signed   By: Allegra Lai M.D.   On: 04/10/2023 15:55    Pending Labs Unresulted Labs (From admission, onward)     Start     Ordered   04/11/23 0500  Basic metabolic panel  Daily,   R      04/10/23 1737   04/11/23 0500  CBC  Daily,   R      04/10/23 1737   04/10/23 1737  Creatinine, serum  (heparin)  Once,   R       Comments: Baseline for heparin therapy IF NOT ALREADY DRAWN.    04/10/23 1737   04/10/23 1737  Brain natriuretic peptide  Once,   R        04/10/23 1737            Vitals/Pain Today's Vitals   04/10/23 1512 04/10/23 1517 04/10/23 1529 04/10/23 1615  BP:  (!) 167/46  (!) 123/38  Pulse:  (!) 39  (!) 33  Resp:  12  (!) 21  SpO2:  100%  100%  Weight: 55 kg     Height: 5\' 3"  (1.6 m)     PainSc: 0-No pain  4      Isolation Precautions No active isolations  Medications Medications  aspirin EC tablet 81 mg (has no administration in time range)  atorvastatin (LIPITOR) tablet 40 mg (has no administration in time range)  ezetimibe (ZETIA) tablet 10 mg (has no administration in time range)  acetaminophen (TYLENOL) tablet 650 mg (has no administration in time range)  ondansetron (ZOFRAN) injection 4 mg (has no administration in time range)  atropine injection 0.4 mg (has no administration in time range)  heparin injection 5,000 Units (has no  administration in time range)    Mobility walks with device     Focused Assessments Cardiac Assessment Handoff:    No results found for: "CKTOTAL", "CKMB", "CKMBINDEX", "TROPONINI" No results found for: "DDIMER" Does the Patient currently have chest pain? No    R Recommendations: See Admitting Provider Note  Report given to:   Additional Notes:

## 2023-04-10 NOTE — Telephone Encounter (Signed)
STAT if HR is under 50 or over 120 (normal HR is 60-100 beats per minute)  What is your heart rate? 40  Do you have a log of your heart rate readings (document readings)?    Do you have any other symptoms? 2 and 1 blockage

## 2023-04-10 NOTE — ED Provider Notes (Signed)
[place] EMERGENCY DEPARTMENT AT [facility] Provider Note   CSN: [id number] Arrival date & time: 04/10/23  1457     History Chief Complaint  Patient presents with   Bradycardia    HPI Whitney Manning is a 88 y.o. female presenting for bradycardia referred by her PCP who noted a low HR at her office. She states that her BP has been running low for the last month as she has been charting. Gets worse when standing up. Has not started any new medications, has cut down on simvastatin. Was hospitalized many  years ago for Syncope when she needed her porcine aortic valve replaced.   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   Patient's recorded medical, surgical, social, medication list and allergies were reviewed in the Snapshot window as part of the initial history.   Review of Systems   Review of Systems  Constitutional:  Negative for appetite change, chills, diaphoresis, fatigue and fever.  HENT:  Negative for congestion.   Eyes:  Positive for visual disturbance.  Respiratory:  Positive for shortness of breath. Negative for cough and chest tightness.   Cardiovascular:  Negative for chest pain, palpitations and leg swelling.  Gastrointestinal:  Negative for abdominal pain, constipation, diarrhea, nausea and vomiting.  Endocrine: Negative for cold intolerance, heat intolerance, polydipsia, polyphagia and polyuria.  Genitourinary:  Negative for difficulty urinating, dysuria and flank pain.  Musculoskeletal:  Negative for back pain.  Skin:  Negative for pallor.  Neurological:  Positive for dizziness and light-headedness. Negative for syncope.    Physical Exam Updated Vital Signs BP (!) 123/38   Pulse (!) 33   Resp (!) 21   Ht 5\' 3"  (1.6  m)   Wt 55 kg   SpO2 100%   BMI 21.48 kg/m  Physical Exam Constitutional:      General: She is not in acute distress.    Appearance: Normal appearance. She is not ill-appearing.  HENT:     Nose: No congestion.  Cardiovascular:     Rate and Rhythm: Bradycardia present.     Heart sounds: No murmur heard.    No friction rub. No gallop.  Pulmonary:     Effort: No respiratory distress.  Chest:     Chest wall: No tenderness.  Abdominal:     General: There is no distension.     Tenderness: There is no abdominal tenderness.  Neurological:     Mental Status: She is alert and oriented to person, place, and time.    ED Course/ Medical Decision Making/ A&P      Medical Decision Making:    Whitney Manning is a 88 y.o. female who presented to the ED today with concerns for bradycardia, referred by her PCP.  HR in the 40s, EKG concern for LBBB 2:1.   Additional history discussed with patient's family/caregivers.   Complete initial physical exam performed, notably the patient had a benign exam except for reports of dizziness and SOB. ROS as above.      Reviewed and confirmed nursing documentation for past medical history, family history, social history.    Initial Assessment:   With the patient's presentation of symptomatic bradycardia not on any  rate limiting medications there is concern for a cardiac etiology such as an arrhythmia or valve failure (current one is porcine) vs orthostatic hypotension vs infectious etiology.    This is most consistent with an acute complicated illness  Initial Plan:  Screening labs including CBC and Metabolic panel to evaluate for infectious or metabolic etiology of disease.  Urinalysis with reflex culture ordered to evaluate for UTI or relevant urologic/nephrologic pathology.  CXR to evaluate for structural/infectious intrathoracic pathology.  EKG to evaluate for cardiac pathology.. Cardiology consult placed. Objective evaluation as below reviewed  with plan for close reassessment  Initial Study Results:   Laboratory  All laboratory results reviewed without evidence of clinically relevant pathology.    Results for orders placed or performed during the hospital encounter of 04/10/23  CBC with Differential  Result Value Ref Range   WBC 6.2 4.0 - 10.5 K/uL   RBC 4.02 3.87 - 5.11 MIL/uL   Hemoglobin 12.9 12.0 - 15.0 g/dL   HCT 09.8 11.9 - 14.7 %   MCV 94.0 80.0 - 100.0 fL   MCH 32.1 26.0 - 34.0 pg   MCHC 34.1 30.0 - 36.0 g/dL   RDW 82.9 56.2 - 13.0 %   Platelets 185 150 - 400 K/uL   nRBC 0.0 0.0 - 0.2 %   Neutrophils Relative % 70 %   Neutro Abs 4.4 1.7 - 7.7 K/uL   Lymphocytes Relative 21 %   Lymphs Abs 1.3 0.7 - 4.0 K/uL   Monocytes Relative 7 %   Monocytes Absolute 0.4 0.1 - 1.0 K/uL   Eosinophils Relative 1 %   Eosinophils Absolute 0.1 0.0 - 0.5 K/uL   Basophils Relative 1 %   Basophils Absolute 0.0 0.0 - 0.1 K/uL   Immature Granulocytes 0 %   Abs Immature Granulocytes 0.02 0.00 - 0.07 K/uL   DG Chest 2 View  Result Date: 04/10/2023 CLINICAL DATA:  Bradycardia EXAM: CHEST - 2 VIEW COMPARISON:  Chest x-ray dated December 30, 2019 FINDINGS: Cardiac and mediastinal contours are within normal limits. Prior median sternotomy. Mild left basilar atelectasis. No evidence of pleural effusion or pneumothorax. IMPRESSION: No active cardiopulmonary disease. Electronically Signed   By: Allegra Lai M.D.   On: 04/10/2023 15:55    EKG EKG was reviewed independently. Concerns for LBBB.   Radiology  All images reviewed independently.  Agree with radiology report at this time.    Consults:  Case discussed with Cardiology who agrees with admission.  Reassessment and Plan:    Whitney Manning is an 88 yo female with a PMH significant for aortic valve replacement (porcine x2) and hyperlipidemia.  Presents with symptomatic bradycardia showing LBBB on EKG. She is not on any rate limiting drugs. Cardiology consulted, the patient will be  admitted for possible placement of a pacemaker.   Clinical Impression:  1. Bradycardia      Admit   Final Clinical Impression(s) / ED Diagnoses Final diagnoses:  Bradycardia    Rx / DC Orders ED Discharge Orders     None         Hassan Rowan, [place], MD 04/10/23 1702    Jacalyn Lefevre, MD 04/11/23 0830

## 2023-04-10 NOTE — ED Notes (Signed)
I have called [phone] telling them we will be up in 10 minutes

## 2023-04-10 NOTE — Assessment & Plan Note (Signed)
This doesn't appear typical for CVA and we can address this if her sx persist.  Addressing bradycardia in the meantime.  She agrees.  This isn't an acute change

## 2023-04-11 ENCOUNTER — Other Ambulatory Visit: Payer: Self-pay

## 2023-04-11 ENCOUNTER — Observation Stay (HOSPITAL_BASED_OUTPATIENT_CLINIC_OR_DEPARTMENT_OTHER): Payer: Medicare Other

## 2023-04-11 DIAGNOSIS — R001 Bradycardia, unspecified: Secondary | ICD-10-CM | POA: Diagnosis not present

## 2023-04-11 DIAGNOSIS — I441 Atrioventricular block, second degree: Secondary | ICD-10-CM | POA: Diagnosis not present

## 2023-04-11 DIAGNOSIS — I5022 Chronic systolic (congestive) heart failure: Secondary | ICD-10-CM | POA: Diagnosis not present

## 2023-04-11 LAB — ECHOCARDIOGRAM COMPLETE
Height: 63 in
S' Lateral: 2.8 cm
Weight: 1964.74 oz

## 2023-04-11 LAB — BASIC METABOLIC PANEL
Anion gap: 10 (ref 5–15)
BUN: 20 mg/dL (ref 8–23)
CO2: 23 mmol/L (ref 22–32)
Calcium: 8.8 mg/dL — ABNORMAL LOW (ref 8.9–10.3)
Chloride: 106 mmol/L (ref 98–111)
Creatinine, Ser: 1.13 mg/dL — ABNORMAL HIGH (ref 0.44–1.00)
GFR, Estimated: 48 mL/min — ABNORMAL LOW (ref >60)
Glucose, Bld: 94 mg/dL (ref 70–99)
Potassium: 3.8 mmol/L (ref 3.5–5.1)
Sodium: 139 mmol/L (ref 135–145)

## 2023-04-11 LAB — CBC
HCT: 32.2 % — ABNORMAL LOW (ref 36.0–46.0)
Hemoglobin: 11 g/dL — ABNORMAL LOW (ref 12.0–15.0)
MCH: 31.6 pg (ref 26.0–34.0)
MCHC: 34.2 g/dL (ref 30.0–36.0)
MCV: 92.5 fL (ref 80.0–100.0)
Platelets: 163 10*3/uL (ref 150–400)
RBC: 3.48 MIL/uL — ABNORMAL LOW (ref 3.87–5.11)
RDW: 13.2 % (ref 11.5–15.5)
WBC: 5.8 10*3/uL (ref 4.0–10.5)
nRBC: 0 % (ref 0.0–0.2)

## 2023-04-11 NOTE — Care Management Obs Status (Signed)
MEDICARE OBSERVATION STATUS NOTIFICATION   Patient Details  Name: Whitney Manning MRN: [id number] Date of Birth: [birth year]   Medicare Observation Status Notification Given:  Yes    Ronny Bacon, RN 04/11/2023, 3:51 PM

## 2023-04-11 NOTE — Progress Notes (Signed)
Progress Note  Patient Name: Whitney Manning Date of Encounter: 04/11/2023  Primary Cardiologist: Meriam Sprague, MD   Subjective   Doing well. No fatigue/shortness of breath lying flat.  Inpatient Medications    Scheduled Meds:  aspirin EC  81 mg Oral Daily   atorvastatin  40 mg Oral Daily   ezetimibe  10 mg Oral Daily   heparin  5,000 Units Subcutaneous Q8H   Continuous Infusions:  PRN Meds: acetaminophen, atropine, ondansetron (ZOFRAN) IV   Vital Signs    Vitals:   04/10/23 2200 04/10/23 2354 04/11/23 0400 04/11/23 0736  BP: (!) 138/50 (!) 134/50 (!) 111/38 (!) 117/41  Pulse: 60 (!) 42 (!) 42   Resp: 12 16 16    Temp: 97.8 F (36.6 C) 98.3 F (36.8 C) 98.5 F (36.9 C) 98.1 F (36.7 C)  TempSrc: Oral Oral Oral Oral  SpO2: 96% 96% 97%   Weight:      Height:       No intake or output data in the 24 hours ending 04/11/23 1150 Filed Weights   04/10/23 1512 04/10/23 1955  Weight: 55 kg 55.7 kg    Telemetry    2:1 AV block; no pauses - Personally Reviewed  ECG    2:1 AV block - Personally Reviewed  Physical Exam   GEN: No acute distress.   Neck: No JVD Cardiac: brady RR, no murmurs, rubs, or gallops.  Respiratory: Clear to auscultation bilaterally. GI: Soft, nontender, non-distended  MS: No edema; No deformity. Neuro:  Nonfocal  Psych: Normal affect   Labs    Chemistry Recent Labs  Lab 04/10/23 1515 04/10/23 1803 04/11/23 0225  NA 139  --  139  K 3.8  --  3.8  CL 107  --  106  CO2 22  --  23  GLUCOSE 101*  --  94  BUN 16  --  20  CREATININE 1.05* 1.11* 1.13*  CALCIUM 9.0  --  8.8*  PROT 6.6  --   --   ALBUMIN 3.9  --   --   AST 33  --   --   ALT 27  --   --   ALKPHOS 65  --   --   BILITOT 1.0  --   --   GFRNONAA 52* 49* 48*  ANIONGAP 10  --  10     Hematology Recent Labs  Lab 04/10/23 1515 04/10/23 1803 04/11/23 0225  WBC 6.2 6.0 5.8  RBC 4.02 3.83* 3.48*  HGB 12.9 11.9* 11.0*  HCT 37.8 35.2* 32.2*  MCV 94.0 91.9  92.5  MCH 32.1 31.1 31.6  MCHC 34.1 33.8 34.2  RDW 13.3 13.2 13.2  PLT 185 164 163    Cardiac EnzymesNo results for input(s): "TROPONINI" in the last 168 hours. No results for input(s): "TROPIPOC" in the last 168 hours.   BNP Recent Labs  Lab 04/10/23 1803  BNP 768.4*     DDimer No results for input(s): "DDIMER" in the last 168 hours.   Summary of Pertinent studies    TTE: 12/08/2022 TTE reviewed in Epic EF 40-45% Repeat TTE pending  Cardiac cath:  Imaging:  Labs: Reviewed in Epic  Patient Profile     88 y.o. female  with hypertension, hyperlipidemia, lung cancer status post RUL lobectomy and XRT, severe aortic stenosis status post AVR, aortic arch reconstruction, chronic HFrEF admitted with 2:1 AV block.  Assessment & Plan    2:1 AV block No reversible cause identified She  is symptomatic with fatigue, lightheadedness Will plan for dual chamber pacemaker with conduction system pacing on Monday, 7/8  Chronic CHFrEF EF has been mildly depressed. Compensated and euvolemic at present BNP is 768.4; no baseline available  Severe AS s/p AVR in 2010 Normal function on TTE in March  CAD s/p LIMA to LAD Continue aspirin and statin    For questions or updates, please contact [facility] Please consult [email or web address] for contact info under Cardiology/STEMI.      Signed, Maurice Small, MD 04/11/2023, 11:50 AM

## 2023-04-11 NOTE — Plan of Care (Signed)

## 2023-04-11 NOTE — Progress Notes (Signed)
*  PRELIMINARY RESULTS* Echocardiogram 2D Echocardiogram has been performed.  Whitney Manning 04/11/2023, 12:20 PM

## 2023-04-12 DIAGNOSIS — Z823 Family history of stroke: Secondary | ICD-10-CM | POA: Diagnosis not present

## 2023-04-12 DIAGNOSIS — I429 Cardiomyopathy, unspecified: Secondary | ICD-10-CM | POA: Diagnosis not present

## 2023-04-12 DIAGNOSIS — H9192 Unspecified hearing loss, left ear: Secondary | ICD-10-CM | POA: Diagnosis not present

## 2023-04-12 DIAGNOSIS — M8580 Other specified disorders of bone density and structure, unspecified site: Secondary | ICD-10-CM | POA: Diagnosis not present

## 2023-04-12 DIAGNOSIS — I110 Hypertensive heart disease with heart failure: Secondary | ICD-10-CM | POA: Diagnosis not present

## 2023-04-12 DIAGNOSIS — Z9049 Acquired absence of other specified parts of digestive tract: Secondary | ICD-10-CM | POA: Diagnosis not present

## 2023-04-12 DIAGNOSIS — E785 Hyperlipidemia, unspecified: Secondary | ICD-10-CM | POA: Diagnosis not present

## 2023-04-12 DIAGNOSIS — Z953 Presence of xenogenic heart valve: Secondary | ICD-10-CM | POA: Diagnosis not present

## 2023-04-12 DIAGNOSIS — Z85118 Personal history of other malignant neoplasm of bronchus and lung: Secondary | ICD-10-CM | POA: Diagnosis not present

## 2023-04-12 DIAGNOSIS — R001 Bradycardia, unspecified: Secondary | ICD-10-CM | POA: Diagnosis not present

## 2023-04-12 DIAGNOSIS — Z8249 Family history of ischemic heart disease and other diseases of the circulatory system: Secondary | ICD-10-CM | POA: Diagnosis not present

## 2023-04-12 DIAGNOSIS — Z7982 Long term (current) use of aspirin: Secondary | ICD-10-CM | POA: Diagnosis not present

## 2023-04-12 DIAGNOSIS — I2510 Atherosclerotic heart disease of native coronary artery without angina pectoris: Secondary | ICD-10-CM | POA: Diagnosis not present

## 2023-04-12 DIAGNOSIS — Z951 Presence of aortocoronary bypass graft: Secondary | ICD-10-CM | POA: Diagnosis not present

## 2023-04-12 DIAGNOSIS — Z888 Allergy status to other drugs, medicaments and biological substances status: Secondary | ICD-10-CM | POA: Diagnosis not present

## 2023-04-12 DIAGNOSIS — Z85818 Personal history of malignant neoplasm of other sites of lip, oral cavity, and pharynx: Secondary | ICD-10-CM | POA: Diagnosis not present

## 2023-04-12 DIAGNOSIS — Z79899 Other long term (current) drug therapy: Secondary | ICD-10-CM | POA: Diagnosis not present

## 2023-04-12 DIAGNOSIS — I5022 Chronic systolic (congestive) heart failure: Secondary | ICD-10-CM | POA: Diagnosis not present

## 2023-04-12 DIAGNOSIS — Z902 Acquired absence of lung [part of]: Secondary | ICD-10-CM | POA: Diagnosis not present

## 2023-04-12 DIAGNOSIS — I441 Atrioventricular block, second degree: Secondary | ICD-10-CM | POA: Diagnosis not present

## 2023-04-12 LAB — BASIC METABOLIC PANEL
Anion gap: 6 (ref 5–15)
BUN: 17 mg/dL (ref 8–23)
CO2: 24 mmol/L (ref 22–32)
Calcium: 8.6 mg/dL — ABNORMAL LOW (ref 8.9–10.3)
Chloride: 109 mmol/L (ref 98–111)
Creatinine, Ser: 1.05 mg/dL — ABNORMAL HIGH (ref 0.44–1.00)
GFR, Estimated: 52 mL/min — ABNORMAL LOW (ref >60)
Glucose, Bld: 92 mg/dL (ref 70–99)
Potassium: 4.2 mmol/L (ref 3.5–5.1)
Sodium: 139 mmol/L (ref 135–145)

## 2023-04-12 LAB — CBC
HCT: 33.1 % — ABNORMAL LOW (ref 36.0–46.0)
Hemoglobin: 11.2 g/dL — ABNORMAL LOW (ref 12.0–15.0)
MCH: 30.7 pg (ref 26.0–34.0)
MCHC: 33.8 g/dL (ref 30.0–36.0)
MCV: 90.7 fL (ref 80.0–100.0)
Platelets: 166 10*3/uL (ref 150–400)
RBC: 3.65 MIL/uL — ABNORMAL LOW (ref 3.87–5.11)
RDW: 13.2 % (ref 11.5–15.5)
WBC: 6.7 10*3/uL (ref 4.0–10.5)
nRBC: 0 % (ref 0.0–0.2)

## 2023-04-12 LAB — SURGICAL PCR SCREEN
MRSA, PCR: NEGATIVE
Staphylococcus aureus: NEGATIVE

## 2023-04-12 MED ORDER — SODIUM CHLORIDE 0.9 % IV SOLN: INTRAVENOUS | Status: DC

## 2023-04-12 MED ORDER — CHLORHEXIDINE GLUCONATE 4 % EX SOLN
60.0000 mL | Freq: Once | CUTANEOUS | Status: AC
  Administered 2023-04-13: 4 via TOPICAL
  Filled 2023-04-12: qty 60

## 2023-04-12 MED ORDER — CHLORHEXIDINE GLUCONATE 4 % EX SOLN
60.0000 mL | Freq: Once | CUTANEOUS | Status: AC
  Administered 2023-04-12: 4 via TOPICAL
  Filled 2023-04-12: qty 60

## 2023-04-12 MED ORDER — SODIUM CHLORIDE 0.9 % IV SOLN
80.0000 mg | INTRAVENOUS | Status: AC
  Administered 2023-04-13: 80 mg

## 2023-04-12 MED ORDER — CEFAZOLIN SODIUM-DEXTROSE 2-4 GM/100ML-% IV SOLN
2.0000 g | INTRAVENOUS | Status: AC
  Administered 2023-04-13: 2 g via INTRAVENOUS

## 2023-04-12 NOTE — Progress Notes (Signed)
Progress Note  Patient Name: Whitney Manning Date of Encounter: 04/12/2023  Primary Cardiologist: Meriam Sprague, MD   Subjective   Doing well. No fatigue/shortness of breath lying flat.  Inpatient Medications    Scheduled Meds:  aspirin EC  81 mg Oral Daily   atorvastatin  40 mg Oral Daily   ezetimibe  10 mg Oral Daily   heparin  5,000 Units Subcutaneous Q8H   Continuous Infusions:  PRN Meds: acetaminophen, atropine, ondansetron (ZOFRAN) IV   Vital Signs    Vitals:   04/12/23 0000 04/12/23 0317 04/12/23 0400 04/12/23 0746  BP:  (!) 128/54  (!) 146/48  Pulse:  (!) 41  (!) 38  Resp:  14  16  Temp:  97.8 F (36.6 C)  97.8 F (36.6 C)  TempSrc:  Oral  Oral  SpO2: 96% 96% 97% 98%  Weight:      Height:       No intake or output data in the 24 hours ending 04/12/23 0754 Filed Weights   04/10/23 1512 04/10/23 1955  Weight: 55 kg 55.7 kg    Telemetry    2:1 AV block; no pauses - Personally Reviewed  ECG    2:1 AV block - Personally Reviewed  Physical Exam   GEN: No acute distress.   Neck: No JVD Cardiac: brady RR, no murmurs, rubs, or gallops.  Respiratory: Clear to auscultation bilaterally. GI: Soft, nontender, non-distended  MS: No edema; No deformity. Neuro:  Nonfocal  Psych: Normal affect   Labs    Chemistry Recent Labs  Lab 04/10/23 1515 04/10/23 1803 04/11/23 0225 04/12/23 0208  NA 139  --  139 139  K 3.8  --  3.8 4.2  CL 107  --  106 109  CO2 22  --  23 24  GLUCOSE 101*  --  94 92  BUN 16  --  20 17  CREATININE 1.05* 1.11* 1.13* 1.05*  CALCIUM 9.0  --  8.8* 8.6*  PROT 6.6  --   --   --   ALBUMIN 3.9  --   --   --   AST 33  --   --   --   ALT 27  --   --   --   ALKPHOS 65  --   --   --   BILITOT 1.0  --   --   --   GFRNONAA 52* 49* 48* 52*  ANIONGAP 10  --  10 6     Hematology Recent Labs  Lab 04/10/23 1803 04/11/23 0225 04/12/23 0208  WBC 6.0 5.8 6.7  RBC 3.83* 3.48* 3.65*  HGB 11.9* 11.0* 11.2*  HCT 35.2* 32.2*  33.1*  MCV 91.9 92.5 90.7  MCH 31.1 31.6 30.7  MCHC 33.8 34.2 33.8  RDW 13.2 13.2 13.2  PLT 164 163 166    Cardiac EnzymesNo results for input(s): "TROPONINI" in the last 168 hours. No results for input(s): "TROPIPOC" in the last 168 hours.   BNP Recent Labs  Lab 04/10/23 1803  BNP 768.4*     DDimer No results for input(s): "DDIMER" in the last 168 hours.   Summary of Pertinent studies    TTE: 12/08/2022 TTE reviewed in Epic EF 40-45% 04/12/2023 TTE EF 45-50%  Cardiac cath:  Imaging:  Labs: Reviewed in Epic  Patient Profile     88 y.o. female  with hypertension, hyperlipidemia, lung cancer status post RUL lobectomy and XRT, severe aortic stenosis status post AVR, aortic arch  reconstruction, chronic HFrEF admitted with 2:1 AV block.  Assessment & Plan    2:1 AV block No reversible cause identified She is symptomatic with fatigue, lightheadedness Will plan for dual chamber pacemaker with conduction system pacing on Monday, 7/8  I discussed the indication for the procedure and the logistics, risks, potential benefit, and after care. I specifically explained that risks include but are not limited to infection, bleeding,damage to blood vessels, lung, and the heart -- but risk of prolonged hospitalization, need for surgery, or the event of stroke, heart attack, or death are low but not zero.   Chronic CHFrEF EF has been mildly depressed. Compensated and euvolemic at present BNP is 768.4; no baseline available  Severe AS s/p AVR in 2010 Normal valve function on TTE  CAD s/p LIMA to LAD Continue statin, zetia Hold aspirin for pacemaker    For questions or updates, please contact [facility] Please consult [email or web address] for contact info under Cardiology/STEMI.      Signed, Maurice Small, MD 04/12/2023, 7:54 AM

## 2023-04-12 NOTE — Plan of Care (Signed)

## 2023-04-13 ENCOUNTER — Inpatient Hospital Stay (HOSPITAL_COMMUNITY)
Admission: EM | Disposition: A | Discharge status: Home / Self Care | Payer: Self-pay | Source: Ambulatory Visit | Attending: Cardiology

## 2023-04-13 ENCOUNTER — Other Ambulatory Visit: Payer: Self-pay

## 2023-04-13 HISTORY — PX: PACEMAKER IMPLANT: EP1218

## 2023-04-13 LAB — BASIC METABOLIC PANEL
Anion gap: 7 (ref 5–15)
BUN: 21 mg/dL (ref 8–23)
CO2: 25 mmol/L (ref 22–32)
Calcium: 8.7 mg/dL — ABNORMAL LOW (ref 8.9–10.3)
Chloride: 106 mmol/L (ref 98–111)
Creatinine, Ser: 1.11 mg/dL — ABNORMAL HIGH (ref 0.44–1.00)
GFR, Estimated: 49 mL/min — ABNORMAL LOW (ref >60)
Glucose, Bld: 97 mg/dL (ref 70–99)
Potassium: 4.8 mmol/L (ref 3.5–5.1)
Sodium: 138 mmol/L (ref 135–145)

## 2023-04-13 LAB — CBC
HCT: 37.1 % (ref 36.0–46.0)
Hemoglobin: 12.3 g/dL (ref 12.0–15.0)
MCH: 31.1 pg (ref 26.0–34.0)
MCHC: 33.2 g/dL (ref 30.0–36.0)
MCV: 93.7 fL (ref 80.0–100.0)
Platelets: 168 10*3/uL (ref 150–400)
RBC: 3.96 MIL/uL (ref 3.87–5.11)
RDW: 13.2 % (ref 11.5–15.5)
WBC: 7.1 10*3/uL (ref 4.0–10.5)
nRBC: 0 % (ref 0.0–0.2)

## 2023-04-13 SURGERY — PACEMAKER IMPLANT

## 2023-04-13 MED ORDER — MIDAZOLAM HCL 5 MG/5ML IJ SOLN
INTRAMUSCULAR | Status: DC | PRN
  Administered 2023-04-13: 1 mg via INTRAVENOUS

## 2023-04-13 MED ORDER — CEFAZOLIN SODIUM-DEXTROSE 2-4 GM/100ML-% IV SOLN
INTRAVENOUS | Status: AC
  Filled 2023-04-13: qty 100

## 2023-04-13 MED ORDER — HEPARIN (PORCINE) IN NACL 1000-0.9 UT/500ML-% IV SOLN
INTRAVENOUS | Status: DC | PRN
  Administered 2023-04-13: 500 mL

## 2023-04-13 MED ORDER — FENTANYL CITRATE (PF) 100 MCG/2ML IJ SOLN
INTRAMUSCULAR | Status: AC
  Filled 2023-04-13: qty 2

## 2023-04-13 MED ORDER — SALINE SPRAY 0.65 % NA SOLN
1.0000 | NASAL | Status: DC | PRN
  Filled 2023-04-13: qty 44

## 2023-04-13 MED ORDER — MIDAZOLAM HCL 5 MG/5ML IJ SOLN
INTRAMUSCULAR | Status: AC
  Filled 2023-04-13: qty 5

## 2023-04-13 MED ORDER — LIDOCAINE HCL 1 % IJ SOLN
INTRAMUSCULAR | Status: AC
  Filled 2023-04-13: qty 60

## 2023-04-13 MED ORDER — LIDOCAINE HCL (PF) 1 % IJ SOLN
INTRAMUSCULAR | Status: DC | PRN
  Administered 2023-04-13: 50 mL

## 2023-04-13 MED ORDER — FENTANYL CITRATE (PF) 100 MCG/2ML IJ SOLN
INTRAMUSCULAR | Status: DC | PRN
  Administered 2023-04-13: 25 ug via INTRAVENOUS

## 2023-04-13 MED ORDER — CEFAZOLIN SODIUM-DEXTROSE 1-4 GM/50ML-% IV SOLN
1.0000 g | Freq: Three times a day (TID) | INTRAVENOUS | Status: DC
  Administered 2023-04-14 (×2): 1 g via INTRAVENOUS
  Filled 2023-04-13 (×3): qty 50

## 2023-04-13 MED ORDER — SODIUM CHLORIDE 0.9 % IV SOLN
INTRAVENOUS | Status: AC
  Filled 2023-04-13: qty 2

## 2023-04-13 SURGICAL SUPPLY — 14 items
CABLE SURGICAL S-101-97-12 (CABLE) ×1 IMPLANT
CATH CPS LOCATOR 3D SM (CATHETERS) IMPLANT
HELIX LOCKING TOOL (MISCELLANEOUS) ×1
KIT MICROPUNCTURE NIT STIFF (SHEATH) IMPLANT
LEAD ULTIPACE 52 LPA1231/52 (Lead) IMPLANT
LEAD ULTIPACE 65 LPA1231/65 (Lead) IMPLANT
PACEMAKER ASSURITY DR-RF (Pacemaker) IMPLANT
PAD DEFIB RADIO PHYSIO CONN (PAD) ×1 IMPLANT
SHEATH 7FR PRELUDE SNAP 13 (SHEATH) IMPLANT
SHEATH 9FR PRELUDE SNAP 13 (SHEATH) IMPLANT
SLITTER AGILIS HISPRO (INSTRUMENTS) IMPLANT
TOOL HELIX LOCKING (MISCELLANEOUS) IMPLANT
TRAY PACEMAKER INSERTION (PACKS) ×1 IMPLANT
WIRE HI TORQ VERSACORE-J 145CM (WIRE) IMPLANT

## 2023-04-13 NOTE — Progress Notes (Signed)
  Patient Name: Whitney Manning Date of Encounter: 04/13/2023  Primary Cardiologist: Meriam Sprague, MD Electrophysiologist: None  Interval Summary   The patient is doing well today.  No new complaints. Remains in Advanced AV block.   Inpatient Medications    Scheduled Meds:  atorvastatin  40 mg Oral Daily   chlorhexidine  60 mL Topical Once   ezetimibe  10 mg Oral Daily   gentamicin (GARAMYCIN) 80 mg in sodium chloride 0.9 % 500 mL irrigation  80 mg Irrigation On Call   Continuous Infusions:  sodium chloride 50 mL/hr at 04/13/23 0653   sodium chloride 50 mL/hr at 04/13/23 0652    ceFAZolin (ANCEF) IV     PRN Meds: acetaminophen, atropine, ondansetron (ZOFRAN) IV   Vital Signs    Vitals:   04/12/23 2008 04/13/23 0009 04/13/23 0456 04/13/23 0754  BP: (!) 162/52 (!) 140/46 (!) 113/42 (!) 145/45  Pulse: (!) 44 (!) 36 (!) 57   Resp: 18 16 15    Temp:  97.9 F (36.6 C) 97.9 F (36.6 C) 97.9 F (36.6 C)  TempSrc:  Oral Oral Oral  SpO2: 97% 95% 95%   Weight:      Height:       No intake or output data in the 24 hours ending 04/13/23 0825 Filed Weights   04/10/23 1512 04/10/23 1955  Weight: 55 kg 55.7 kg    Physical Exam    GEN- The patient is elderly appearing, alert and oriented x 3 today.   Lungs- Clear to ausculation bilaterally, normal work of breathing Cardiac-  Slow and regular  rate and rhythm, no murmurs, rubs or gallops GI- soft, NT, ND, + BS Extremities- no clubbing or cyanosis. No edema  Telemetry    Likely CHB in 50-60s (personally reviewed)  Hospital Course    88 y.o. female  with hypertension, hyperlipidemia, lung cancer status post RUL lobectomy and XRT, severe aortic stenosis status post AVR, aortic arch reconstruction, chronic HFrEF admitted with 2:1 AV block.   Assessment & Plan    Advanced AV block 2:1 AV block on arrival, now with likely CHB Explained risks, benefits, and alternatives to PPM implantation, including but not limited to  bleeding, infection, pneumothorax, pericardial effusion, lead dislodgement, heart attack, stroke, or death.  Pt verbalized understanding and agrees to proceed.  Chronic CHFrEF LVEF 45-50% 04/11/2023  Volume status stable on exam.    Severe AS s/p AVR in 2010 Normal valve function on TTE   CAD s/p LIMA to LAD Continue statin and zetia ASA on hold for pacemaker   For questions or updates, please contact [facility] Please consult [email or web address] for contact info under Cardiology/STEMI.  Signed, Graciella Freer, PA-C  04/13/2023, 8:25 AM

## 2023-04-13 NOTE — Discharge Instructions (Signed)
After Your Pacemaker   You have a Abbott Pacemaker  ACTIVITY Do not lift your arm above shoulder height for 1 week after your procedure. After 7 days, you may progress as below.  You should remove your sling 24 hours after your procedure, unless otherwise instructed by your provider.     Monday April 20, 2023  Tuesday April 21, 2023 Wednesday April 22, 2023 Thursday April 23, 2023   Do not lift, push, pull, or carry anything over 10 pounds with the affected arm until 6 weeks (Monday May 25, 2023 ) after your procedure.   You may drive AFTER your wound check, unless you have been told otherwise by your provider.   Ask your healthcare provider when you can go back to work   INCISION/Dressing If you are on a blood thinner such as Coumadin, Xarelto, Eliquis, Plavix, or Pradaxa please confirm with your provider when this should be resumed.   If large square, outer bandage is left in place, this can be removed after 24 hours from your procedure. Do not remove steri-strips or glue as below.   If a PRESSURE DRESSING (a bulky dressing that usually goes up over your shoulder) was applied or left in place, please follow instructions given by your provider on when to return to have this removed.   Monitor your Pacemaker site for redness, swelling, and drainage. Call the device clinic at [phone] if you experience these symptoms or fever/chills.  If your incision is sealed with Steri-strips or staples, you may shower 7 days after your procedure or when told by your provider. Do not remove the steri-strips or let the shower hit directly on your site. You may wash around your site with soap and water.    If you were discharged in a sling, please do not wear this during the day more than 48 hours after your surgery unless otherwise instructed. This may increase the risk of stiffness and soreness in your shoulder.   Avoid lotions, ointments, or perfumes over your incision until it is  well-healed.  You may use a hot tub or a pool AFTER your wound check appointment if the incision is completely closed.  Pacemaker Alerts:  Some alerts are vibratory and others beep. These are NOT emergencies. Please call our office to let us know. If this occurs at night or on weekends, it can wait until the next business day. Send a remote transmission.  If your device is capable of reading fluid status (for heart failure), you will be offered monthly monitoring to review this with you.   DEVICE MANAGEMENT Remote monitoring is used to monitor your pacemaker from home. This monitoring is scheduled every 91 days by our office. It allows [place] to keep an eye on the functioning of your device to ensure it is working properly. You will routinely see your Electrophysiologist annually (more often if necessary).   You should receive your ID card for your new device in 4-8 weeks. Keep this card with you at all times once received. Consider wearing a medical alert bracelet or necklace.  Your Pacemaker may be MRI compatible. This will be discussed at your next office visit/wound check.  You should avoid contact with strong electric or magnetic fields.   Do not use amateur (ham) radio equipment or electric (arc) welding torches. MP3 player headphones with magnets should not be used. Some devices are safe to use if held at least 12 inches (30 cm) from your Pacemaker. These include power tools, lawn  mowers, and speakers. If you are unsure if something is safe to use, ask your health care provider.  When using your cell phone, hold it to the ear that is on the opposite side from the Pacemaker. Do not leave your cell phone in a pocket over the Pacemaker.  You may safely use electric blankets, heating pads, computers, and microwave ovens.  Call the office right away if: You have chest pain. You feel more short of breath than you have felt before. You feel more light-headed than you have felt before. Your  incision starts to open up.  This information is not intended to replace advice given to you by your health care provider. Make sure you discuss any questions you have with your health care provider.

## 2023-04-13 NOTE — TOC CM/SW Note (Signed)
Transition of Care [facility]) - Inpatient Brief Assessment   Patient Details  Name: Whitney Manning MRN: [id number] Date of Birth: August 30, 1935  Transition of [facility] CM/SW Contact:    Gala Lewandowsky, RN Phone Number: 04/13/2023, 4:00 PM   Clinical Narrative: Patient presented for bradycardia and dizziness-AV Block. Plan for pacemaker implant. Case Manager will continue to follow for transition of care needs as the patient progresses.   Transition of Care Asessment: Insurance and Status: Insurance coverage has been reviewed Patient has primary care physician: Yes Prior/Current Home Services: No current home services Social Determinants of Health Reivew: SDOH reviewed no interventions necessary Readmission risk has been reviewed: Yes Transition of care needs: transition of care needs identified, TOC will continue to follow

## 2023-04-13 NOTE — Plan of Care (Signed)
  Problem: Education: Goal: Understanding of cardiac disease, CV risk reduction, and recovery process will improve Outcome: Progressing Goal: Individualized Educational Video(s) Outcome: Progressing   Problem: Activity: Goal: Ability to tolerate increased activity will improve Outcome: Progressing   Problem: Cardiac: Goal: Ability to achieve and maintain adequate cardiovascular perfusion will improve Outcome: Progressing   Problem: Health Behavior/Discharge Planning: Goal: Ability to safely manage health-related needs after discharge will improve Outcome: Progressing   Problem: Education: Goal: Knowledge of General Education information will improve Description: Including pain rating scale, medication(s)/side effects and non-pharmacologic comfort measures Outcome: Progressing   Problem: Health Behavior/Discharge Planning: Goal: Ability to manage health-related needs will improve Outcome: Progressing   Problem: Clinical Measurements: Goal: Ability to maintain clinical measurements within normal limits will improve Outcome: Progressing Goal: Will remain free from infection Outcome: Progressing Goal: Diagnostic test results will improve Outcome: Progressing Goal: Respiratory complications will improve Outcome: Progressing Goal: Cardiovascular complication will be avoided Outcome: Progressing   Problem: Activity: Goal: Risk for activity intolerance will decrease Outcome: Progressing   Problem: Nutrition: Goal: Adequate nutrition will be maintained Outcome: Progressing   Problem: Coping: Goal: Level of anxiety will decrease Outcome: Progressing   Problem: Elimination: Goal: Will not experience complications related to bowel motility Outcome: Progressing Goal: Will not experience complications related to urinary retention Outcome: Progressing   Problem: Pain Managment: Goal: General experience of comfort will improve Outcome: Progressing   Problem:  Safety: Goal: Ability to remain free from injury will improve Outcome: Progressing   Problem: Skin Integrity: Goal: Risk for impaired skin integrity will decrease Outcome: Progressing   Problem: Education: Goal: Knowledge of cardiac device and self-care will improve Outcome: Progressing Goal: Ability to safely manage health related needs after discharge will improve Outcome: Progressing Goal: Individualized Educational Video(s) Outcome: Progressing   Problem: Cardiac: Goal: Ability to achieve and maintain adequate cardiopulmonary perfusion will improve Outcome: Progressing   

## 2023-04-14 ENCOUNTER — Encounter (HOSPITAL_COMMUNITY): Payer: Self-pay | Admitting: Cardiovascular Disease

## 2023-04-14 ENCOUNTER — Inpatient Hospital Stay (HOSPITAL_COMMUNITY): Payer: Medicare Other

## 2023-04-14 LAB — BASIC METABOLIC PANEL
Anion gap: 8 (ref 5–15)
BUN: 20 mg/dL (ref 8–23)
CO2: 21 mmol/L — ABNORMAL LOW (ref 22–32)
Calcium: 8.1 mg/dL — ABNORMAL LOW (ref 8.9–10.3)
Chloride: 107 mmol/L (ref 98–111)
Creatinine, Ser: 0.98 mg/dL (ref 0.44–1.00)
GFR, Estimated: 57 mL/min — ABNORMAL LOW (ref >60)
Glucose, Bld: 102 mg/dL — ABNORMAL HIGH (ref 70–99)
Potassium: 3.5 mmol/L (ref 3.5–5.1)
Sodium: 136 mmol/L (ref 135–145)

## 2023-04-14 LAB — CBC
HCT: 33.5 % — ABNORMAL LOW (ref 36.0–46.0)
Hemoglobin: 11.3 g/dL — ABNORMAL LOW (ref 12.0–15.0)
MCH: 31 pg (ref 26.0–34.0)
MCHC: 33.7 g/dL (ref 30.0–36.0)
MCV: 92 fL (ref 80.0–100.0)
Platelets: 157 10*3/uL (ref 150–400)
RBC: 3.64 MIL/uL — ABNORMAL LOW (ref 3.87–5.11)
RDW: 13.1 % (ref 11.5–15.5)
WBC: 7.6 10*3/uL (ref 4.0–10.5)
nRBC: 0 % (ref 0.0–0.2)

## 2023-04-14 MED ORDER — ACETAMINOPHEN 325 MG PO TABS: 650.0000 mg | ORAL_TABLET | ORAL | Status: DC | PRN

## 2023-04-14 MED ORDER — ASPIRIN 81 MG PO TBEC
81.00 mg | DELAYED_RELEASE_TABLET | Freq: Every day | ORAL | 3 refills | Status: AC
Start: 2023-04-18 — End: ?

## 2023-04-14 MED FILL — Gentamicin Sulfate Inj 40 MG/ML: INTRAMUSCULAR | Qty: 80 | Status: AC

## 2023-04-14 NOTE — Plan of Care (Signed)
  Problem: Education: Goal: Understanding of cardiac disease, CV risk reduction, and recovery process will improve Outcome: Progressing Goal: Individualized Educational Video(s) Outcome: Progressing   Problem: Activity: Goal: Ability to tolerate increased activity will improve Outcome: Progressing   Problem: Health Behavior/Discharge Planning: Goal: Ability to safely manage health-related needs after discharge will improve Outcome: Progressing   Problem: Health Behavior/Discharge Planning: Goal: Ability to manage health-related needs will improve Outcome: Progressing   Problem: Clinical Measurements: Goal: Will remain free from infection Outcome: Progressing   Problem: Activity: Goal: Risk for activity intolerance will decrease Outcome: Progressing   Problem: Elimination: Goal: Will not experience complications related to bowel motility Outcome: Progressing   Problem: Pain Managment: Goal: General experience of comfort will improve Outcome: Progressing   Problem: Safety: Goal: Ability to remain free from injury will improve Outcome: Progressing   Problem: Skin Integrity: Goal: Risk for impaired skin integrity will decrease Outcome: Progressing   Problem: Education: Goal: Knowledge of cardiac device and self-care will improve Outcome: Progressing Goal: Ability to safely manage health related needs after discharge will improve Outcome: Progressing Goal: Individualized Educational Video(s) Outcome: Progressing

## 2023-04-14 NOTE — Discharge Summary (Signed)
ELECTROPHYSIOLOGY PROCEDURE DISCHARGE SUMMARY    Patient ID: Whitney Manning,  MRN: [id number], DOB/AGE: 86/04/1935 86 y.o.  Admit date: 04/10/2023 Discharge date: 04/14/2023  Primary Care Physician: Joaquim Nam, MD  Primary Cardiologist: Meriam Sprague, MD  Electrophysiologist: Dr. 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 Implantation of a Abbott Dual Chamber PPM on 7/8 by Dr. Nelly Laurence. The patient received a Abbott Assurity [id number] with a Abbott Ultipace 1231-52 right atrial lead and a Abbott Ultipace 1231-65 right ventricular lead.  There were no immediate post procedure complications.   3. CXR on 04/14/2023 demonstrated no pneumothorax status post device implantation.       Brief HPI: Whitney Manning is a 88 y.o. female was admitted for symptomatic bradycardia and electrophysiology team asked to see for consideration of PPM implantation.  Past medical history includes above.  The patient has had symptomatic bradycardia without reversible causes identified.  Risks, benefits, and alternatives to PPM implantation were reviewed with the patient who wished to proceed.   Hospital Course:  The patient was admitted and underwent implantation of a Abbott dual chamber PPM with details as outlined above.  She was monitored on telemetry overnight which demonstrated appropriate pacing.  Left chest was without hematoma or ecchymosis.  The device  was interrogated and found to be functioning normally.  CXR was obtained and demonstrated no pneumothorax status post device implantation.  Wound care, arm mobility, and restrictions were reviewed with the patient.  The patient was examined and considered stable for discharge to home.    Anticoagulation resumption This patient is not on anticoagulation     Physical Exam: Vitals:   04/13/23 2030 04/14/23 0003 04/14/23 0030 04/14/23 0355  BP: (!) 146/58 127/65 (!) 143/61 (!) 139/59  Pulse: 66 65 82 64  Resp: 20 16 10 16   Temp: 97.7 F (36.5 C) 98.4 F (36.9 C)  98.2 F (36.8 C)  TempSrc: Oral Oral  Oral  SpO2: 99% 96% 97% 97%  Weight:      Height:        GEN- NAD. A&O x 3.  HEENT: Normocephalic, atraumatic Lungs- CTAB, Normal effort.  Heart- RRR, No M/G/R.  GI- Soft, NT, ND.  Extremities- No clubbing, cyanosis, or edema;  Skin- warm and dry, no rash or lesion, left chest without hematoma/ecchymosis  Discharge Medications:  Allergies as of 04/14/2023       Reactions   Entresto [sacubitril-valsartan] Other (See Comments)   Dizziness  Lightheaded    Fosamax [alendronate Sodium] Other (See Comments)   Epistaxis    Lopressor [metoprolol] Itching        Medication List     TAKE these medications    acetaminophen 325 MG tablet Commonly known as: TYLENOL Take 2 tablets (650 mg total) by mouth every 4 (four) hours as needed for headache or mild pain.   aspirin EC 81 MG tablet Take 1 tablet (81 mg total) by mouth daily. Swallow whole. Start  taking on: April 18, 2023 What changed: These instructions start on April 18, 2023. If you are unsure what to do until then, ask your doctor or other care provider.   atorvastatin 40 MG tablet Commonly known as: LIPITOR Take 1 tablet (40 mg total) by mouth daily.   COQ10 PO Take 1 capsule by mouth daily.   ezetimibe 10 MG tablet Commonly known as: ZETIA Take 1 tablet (10 mg total) by mouth daily.   losartan 25 MG  tablet Commonly known as: COZAAR Take 0.5 tablets (12.5 mg total) by mouth at bedtime. What changed: when to take this   Vitamin D 50 MCG (2000 UT) Caps Take 1 capsule (2,000 Units total) by mouth daily.        Disposition:    Follow-up Information     [facility] Follow up.   Specialty: Cardiology Why: on 7/24 at 0840 for post pacemaker check Contact information: [address], [id number] [place] [address] [phone]                Duration of Discharge Encounter: Greater than 30 minutes including physician time.  Dustin Flock, PA-C  04/14/2023 7:17 AM

## 2023-04-14 NOTE — Plan of Care (Signed)
  Problem: Education: Goal: Understanding of cardiac disease, CV risk reduction, and recovery process will improve Outcome: Progressing   Problem: Activity: Goal: Ability to tolerate increased activity will improve Outcome: Progressing   Problem: Health Behavior/Discharge Planning: Goal: Ability to safely manage health-related needs after discharge will improve Outcome: Progressing   Problem: Health Behavior/Discharge Planning: Goal: Ability to manage health-related needs will improve Outcome: Progressing   Problem: Activity: Goal: Risk for activity intolerance will decrease Outcome: Progressing   Problem: Pain Managment: Goal: General experience of comfort will improve Outcome: Progressing   Problem: Skin Integrity: Goal: Risk for impaired skin integrity will decrease Outcome: Progressing   Problem: Education: Goal: Knowledge of cardiac device and self-care will improve Outcome: Progressing Goal: Ability to safely manage health related needs after discharge will improve Outcome: Progressing   Problem: Cardiac: Goal: Ability to achieve and maintain adequate cardiovascular perfusion will improve Outcome: Completed/Met   Problem: Education: Goal: Knowledge of General Education information will improve Description: Including pain rating scale, medication(s)/side effects and non-pharmacologic comfort measures Outcome: Completed/Met   Problem: Clinical Measurements: Goal: Ability to maintain clinical measurements within normal limits will improve Outcome: Completed/Met Goal: Diagnostic test results will improve Outcome: Completed/Met Goal: Cardiovascular complication will be avoided Outcome: Completed/Met   Problem: Nutrition: Goal: Adequate nutrition will be maintained Outcome: Completed/Met   Problem: Coping: Goal: Level of anxiety will decrease Outcome: Completed/Met   Problem: Elimination: Goal: Will not experience complications related to urinary  retention Outcome: Completed/Met   Problem: Cardiac: Goal: Ability to achieve and maintain adequate cardiopulmonary perfusion will improve Outcome: Completed/Met   Problem: Clinical Measurements: Goal: Respiratory complications will improve Outcome: Not Applicable

## 2023-04-14 NOTE — Plan of Care (Signed)
  Problem: Education: Goal: Understanding of cardiac disease, CV risk reduction, and recovery process will improve 04/14/2023 0858 by Herma Carson, RN Outcome: Adequate for Discharge 04/14/2023 0805 by Herma Carson, RN Outcome: Progressing Goal: Individualized Educational Video(s) 04/14/2023 0858 by Herma Carson, RN Outcome: Adequate for Discharge 04/14/2023 0805 by Herma Carson, RN Outcome: Progressing   Problem: Activity: Goal: Ability to tolerate increased activity will improve 04/14/2023 0858 by Herma Carson, RN Outcome: Adequate for Discharge 04/14/2023 0805 by Herma Carson, RN Outcome: Progressing   Problem: Health Behavior/Discharge Planning: Goal: Ability to safely manage health-related needs after discharge will improve 04/14/2023 0858 by Herma Carson, RN Outcome: Adequate for Discharge 04/14/2023 0805 by Herma Carson, RN Outcome: Progressing   Problem: Health Behavior/Discharge Planning: Goal: Ability to manage health-related needs will improve 04/14/2023 0858 by Herma Carson, RN Outcome: Adequate for Discharge 04/14/2023 0805 by Herma Carson, RN Outcome: Progressing   Problem: Clinical Measurements: Goal: Will remain free from infection 04/14/2023 0858 by Herma Carson, RN Outcome: Adequate for Discharge 04/14/2023 0805 by Herma Carson, RN Outcome: Progressing   Problem: Activity: Goal: Risk for activity intolerance will decrease 04/14/2023 0858 by Herma Carson, RN Outcome: Adequate for Discharge 04/14/2023 0805 by Herma Carson, RN Outcome: Progressing   Problem: Elimination: Goal: Will not experience complications related to bowel motility 04/14/2023 0858 by Herma Carson, RN Outcome: Adequate for Discharge 04/14/2023 0805 by Herma Carson, RN Outcome: Progressing   Problem: Pain Managment: Goal: General experience of comfort will improve 04/14/2023 0858 by Herma Carson, RN Outcome: Adequate for Discharge 04/14/2023 0805 by Herma Carson, RN Outcome: Progressing   Problem: Safety: Goal: Ability to remain free from injury will improve 04/14/2023 0858 by Herma Carson, RN Outcome: Adequate for Discharge 04/14/2023 0805 by Herma Carson, RN Outcome: Progressing   Problem: Skin Integrity: Goal: Risk for impaired skin integrity will decrease 04/14/2023 0858 by Herma Carson, RN Outcome: Adequate for Discharge 04/14/2023 0805 by Herma Carson, RN Outcome: Progressing   Problem: Education: Goal: Knowledge of cardiac device and self-care will improve 04/14/2023 0858 by Herma Carson, RN Outcome: Adequate for Discharge 04/14/2023 0805 by Herma Carson, RN Outcome: Progressing Goal: Ability to safely manage health related needs after discharge will improve 04/14/2023 0858 by Herma Carson, RN Outcome: Adequate for Discharge 04/14/2023 0805 by Herma Carson, RN Outcome: Progressing Goal: Individualized Educational Video(s) 04/14/2023 0858 by Herma Carson, RN Outcome: Adequate for Discharge 04/14/2023 0805 by Herma Carson, RN Outcome: Progressing

## 2023-04-15 ENCOUNTER — Telehealth: Payer: Self-pay

## 2023-04-15 NOTE — Transitions of Care (Post Inpatient/ED Visit) (Signed)
   04/15/2023  Name: Whitney Manning MRN: [id number] DOB: [birth year]  Today's TOC FU Call Status: Today's TOC FU Call Status:: Successful TOC FU Call Competed TOC FU Call Complete Date: 04/15/23  Transition Care Management Follow-up Telephone Call Date of Discharge: 04/14/23 Discharge Facility: [facility]) Type of Discharge: Inpatient Admission Primary Inpatient Discharge Diagnosis:: Bradycardia How have you been since you were released from the hospital?: Better Any questions or concerns?: No  Items Reviewed: Did you receive and understand the discharge instructions provided?: Yes Medications obtained,verified, and reconciled?: Yes (Medications Reviewed) Any new allergies since your discharge?: No Dietary orders reviewed?: Yes Do you have support at home?: Yes  Medications Reviewed Today: Medications Reviewed Today     Reviewed by Merleen Nicely, LPN (Licensed Practical Nurse) on 04/15/23 at 1246  Med List Status: <None>   Medication Order Taking? Sig Documenting Provider Last Dose Status Informant  acetaminophen (TYLENOL) 325 MG tablet [id number] Yes Take 2 tablets (650 mg total) by mouth every 4 (four) hours as needed for headache or mild pain. Graciella Freer, PA-C Taking Active   aspirin EC 81 MG tablet [id number] Yes Take 1 tablet (81 mg total) by mouth daily. Swallow whole. Graciella Freer, PA-C Taking Active   atorvastatin (LIPITOR) 40 MG tablet [id number] Yes Take 1 tablet (40 mg total) by mouth daily. Meriam Sprague, MD Taking Active Self, Child, Pharmacy Records  Cholecalciferol (VITAMIN D) 2000 UNITS CAPS [id number] Yes Take 1 capsule (2,000 Units total) by mouth daily. Stacie Glaze, MD Taking Active Self, Child  Coenzyme Q10 (COQ10 PO) [id number] Yes Take 1 capsule by mouth daily. [provider] Taking Active Self, Child  ezetimibe (ZETIA) 10 MG tablet [id number] Yes Take 1 tablet (10 mg total) by mouth daily. Meriam Sprague, MD  Taking Active Self, Child, Pharmacy Records  losartan (COZAAR) 25 MG tablet [id number] Yes Take 0.5 tablets (12.5 mg total) by mouth at bedtime.  Patient taking differently: Take 12.5 mg by mouth daily.   Meriam Sprague, MD Taking Active Self, Child, Pharmacy Records            Home Care and Equipment/Supplies: Were Home Health Services Ordered?: No Any new equipment or medical supplies ordered?: No  Functional Questionnaire: Do you need assistance with bathing/showering or dressing?: No Do you need assistance with meal preparation?: No Do you need assistance with eating?: No Do you have difficulty maintaining continence: No Do you need assistance with getting out of bed/getting out of a chair/moving?: No Do you have difficulty managing or taking your medications?: No  Follow up appointments reviewed: PCP Follow-up appointment confirmed?: No MD Provider Line Number:[phone] Given: Yes Specialist Hospital Follow-up appointment confirmed?: Yes Follow-Up Specialty Provider:: cardiology Do you need transportation to your follow-up appointment?: No Do you understand care options if your condition(s) worsen?: Yes-patient verbalized understanding    SIGNATURE  Woodfin Ganja LPN [facility] Nurse Health Advisor Direct Dial [phone]

## 2023-04-27 NOTE — Patient Instructions (Signed)
   After Your Pacemaker   Monitor your pacemaker site for redness, swelling, and drainage. Call the device clinic at [phone] if you experience these symptoms or fever/chills.  Your incision was closed with Steri-strips or staples:  You may shower 7 days after your procedure and wash your incision with soap and water. Avoid lotions, ointments, or perfumes over your incision until it is well-healed.  You may use a hot tub or a pool after your wound check appointment if the incision is completely closed.  Do not lift, push or pull greater than 10 pounds with the affected arm until 6 weeks after your procedure.  UNTIL AFTER AUGUST 21ST. There are no other restrictions in arm movement after your wound check appointment.  You may drive, unless driving has been restricted by your healthcare providers.  Remote monitoring is used to monitor your pacemaker from home. This monitoring is scheduled every 91 days by our office. It allows [place] to keep an eye on the functioning of your device to ensure it is working properly. You will routinely see your Electrophysiologist annually (more often if necessary).

## 2023-04-29 ENCOUNTER — Ambulatory Visit: Payer: Medicare Other | Attending: Internal Medicine

## 2023-04-29 DIAGNOSIS — I442 Atrioventricular block, complete: Secondary | ICD-10-CM

## 2023-04-29 LAB — CUP PACEART INCLINIC DEVICE CHECK
Battery Remaining Longevity: 133 mo
Battery Voltage: 3.11 V
Brady Statistic RA Percent Paced: 1.1 %
Brady Statistic RV Percent Paced: 97 %
Date Time Interrogation Session: 20240724130629
Implantable Lead Connection Status: 753985
Implantable Lead Connection Status: 753985
Implantable Lead Implant Date: 20240708
Implantable Lead Implant Date: 20240708
Implantable Lead Location: 753859
Implantable Lead Location: 753860
Implantable Pulse Generator Implant Date: 20240708
Lead Channel Impedance Value: 450 Ohm
Lead Channel Impedance Value: 575 Ohm
Lead Channel Pacing Threshold Amplitude: 0.75 V
Lead Channel Pacing Threshold Amplitude: 0.75 V
Lead Channel Pacing Threshold Amplitude: 0.75 V
Lead Channel Pacing Threshold Amplitude: 0.75 V
Lead Channel Pacing Threshold Pulse Width: 0.5 ms
Lead Channel Pacing Threshold Pulse Width: 0.5 ms
Lead Channel Pacing Threshold Pulse Width: 0.5 ms
Lead Channel Pacing Threshold Pulse Width: 0.5 ms
Lead Channel Sensing Intrinsic Amplitude: 12 mV
Lead Channel Sensing Intrinsic Amplitude: 2.7 mV
Lead Channel Setting Pacing Amplitude: 0.875
Lead Channel Setting Pacing Amplitude: 1.75 V
Lead Channel Setting Pacing Pulse Width: 0.5 ms
Lead Channel Setting Sensing Sensitivity: 4 mV
Pulse Gen Model: 2272
Pulse Gen Serial Number: 5827861

## 2023-04-29 NOTE — Progress Notes (Signed)
Wound check appointment. Steri-strips removed. Wound without redness or edema. Incision edges approximated, wound well healed. Device interrogation by Raj Janus, RN.  Normal device function. Thresholds, sensing, and impedances consistent with implant measurements. Device programmed auto capture programmed on for extra safety margin until 3 month visit. Histogram distribution appropriate for patient and level of activity. No mode switches or high ventricular rates noted. Patient educated about wound care, arm mobility, lifting restrictions. ROV in 3 months with implanting physician.

## 2023-05-06 ENCOUNTER — Ambulatory Visit (INDEPENDENT_AMBULATORY_CARE_PROVIDER_SITE_OTHER): Payer: Medicare Other

## 2023-05-06 VITALS — Ht 64.0 in | Wt 120.0 lb

## 2023-05-06 DIAGNOSIS — Z0000 Encounter for general adult medical examination without abnormal findings: Secondary | ICD-10-CM | POA: Diagnosis not present

## 2023-05-06 NOTE — Progress Notes (Signed)
Subjective:   Whitney Manning is a 88 y.o. female who presents for Medicare Annual (Subsequent) preventive examination.  Visit Complete: Virtual  I connected with  Sabino Niemann on 05/06/23 by a audio enabled telemedicine application and verified that I am speaking with the correct person using two identifiers.  Patient Location: Home  Provider Location: Home Office  I discussed the limitations of evaluation and management by telemedicine. The patient expressed understanding and agreed to proceed.  Vital Signs: Unable to obtain new vitals due to this being a telehealth visit.   Review of Systems      Cardiac Risk Factors include: advanced age (>42men, >59 women);dyslipidemia;sedentary lifestyle     Objective:    Today's Vitals   05/06/23 1118  Weight: 120 lb (54.4 kg)  Height: 5\' 4"  (1.626 m)   Body mass index is 20.6 kg/m.     05/06/2023   11:26 AM 04/10/2023    8:00 PM 04/10/2023    3:13 PM 04/30/2022   11:01 AM 04/25/2021    3:08 PM  Advanced Directives  Does Patient Have a Medical Advance Directive? Yes  No No No  Type of Estate agent of [place];Living will      Copy of Healthcare Power of Attorney in Chart? No - copy requested      Would patient like information on creating a medical advance directive?  No - Patient declined  No - Patient declined No - Patient declined    Current Medications (verified) Outpatient Encounter Medications as of 05/06/2023  Medication Sig   acetaminophen (TYLENOL) 325 MG tablet Take 2 tablets (650 mg total) by mouth every 4 (four) hours as needed for headache or mild pain.   aspirin EC 81 MG tablet Take 1 tablet (81 mg total) by mouth daily. Swallow whole.   atorvastatin (LIPITOR) 40 MG tablet Take 1 tablet (40 mg total) by mouth daily.   Cholecalciferol (VITAMIN D) 2000 UNITS CAPS Take 1 capsule (2,000 Units total) by mouth daily.   Coenzyme Q10 (COQ10 PO) Take 1 capsule by mouth daily.   ezetimibe (ZETIA) 10  MG tablet Take 1 tablet (10 mg total) by mouth daily.   losartan (COZAAR) 25 MG tablet Take 0.5 tablets (12.5 mg total) by mouth at bedtime. (Patient taking differently: Take 12.5 mg by mouth daily.)   No facility-administered encounter medications on file as of 05/06/2023.    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     Family History  Problem Relation Age of Onset   Stroke Mother    Heart disease Father    Breast cancer Neg Hx  Colon cancer Neg Hx    Social History   Socioeconomic History   Marital status: Married    Spouse name: Not on file   Number of children: Not on file   Years of education: Not on file   Highest education level: Not on file  Occupational History   Not on file  Tobacco Use   Smoking status: Never   Smokeless tobacco: Never  Substance and Sexual Activity   Alcohol use: Yes    Comment: rare   Drug use: No   Sexual activity: Yes  Other Topics Concern   Not on file  Social History Narrative   All siblings deceased   First husband died of colon cancer.  Remarried 1998.    1 son and 1 grandson   Retired, prev worked with Community education officer, then at [employer]   Social Determinants  of [employer]: Low Risk  (05/06/2023)   Overall Financial Resource Strain (CARDIA)    Difficulty of Paying Living Expenses: Not hard at all  Food Insecurity: No Food Insecurity (05/06/2023)   Hunger Vital Sign    Worried About Running Out of Food in the Last Year: Never true    Ran Out of Food in the Last Year: Never true  Transportation Needs: No Transportation Needs (05/06/2023)   PRAPARE - Administrator, Civil Service (Medical): No    Lack of Transportation (Non-Medical): No  Physical Activity: Insufficiently Active (05/06/2023)   Exercise Vital Sign    Days of Exercise per Week: 3 days    Minutes of Exercise per Session: 20 min  Stress: No Stress Concern Present (05/06/2023)   [employer] of Occupational Health - Occupational Stress Questionnaire    Feeling of Stress : Not at all  Social Connections: Moderately Integrated (05/06/2023)   Social Connection and Isolation Panel [NHANES]    Frequency of Communication with Friends and Family: More than three times a week    Frequency of Social Gatherings with Friends and Family: More than three times a week    Attends Religious Services: More than 4 times per year    Active Member of [employer] or Organizations: No    Attends Engineer, structural: Never    Marital Status: Married    Tobacco Counseling Counseling given: Not Answered   Clinical Intake:  Pre-visit preparation completed: Yes  Pain : No/denies pain     BMI - recorded: 20.6 Nutritional Status: BMI of 19-24  Normal Nutritional Risks: None Diabetes: No  How often do you need to have someone help you when you read instructions, pamphlets, or other written materials from your doctor or pharmacy?: 1 - Never  Interpreter Needed?: No  Information entered by :: C.Cleveland Paiz LPN   Activities of Daily Living    05/06/2023   11:28 AM 04/10/2023    8:00 PM  In your present state of health, do you have any difficulty performing  the following activities:  Hearing? 0 1  Vision? 0 0  Difficulty concentrating or making decisions? 0 0  Walking or climbing stairs? 0 0  Dressing or bathing? 0 0  Doing errands, shopping? 0 0  Preparing Food and eating ? N   Using the Toilet? N   In the past six months, have you accidently leaked urine? N   Do you have problems with loss of bowel control? N   Managing your Medications? N   Managing your Finances? N   Housekeeping or managing your  Housekeeping? N     Patient Care Team: Joaquim Nam, MD as PCP - General (Family Medicine) Meriam Sprague, MD as PCP - Cardiology (Cardiology)  Indicate any recent Medical Services you may have received from other than [facility] providers in the past year (date may be approximate).     Assessment:   This is a routine wellness examination for Himani.  Hearing/Vision screen Hearing Screening - Comments:: Deaf in left ear, wears aids Vision Screening - Comments:: Readers - Unknown Provider - UTD on exams  Dietary issues and exercise activities discussed:     Goals Addressed             This Visit's Progress    Patient Stated       Stay active.       Depression Screen    05/06/2023   11:25 AM 04/30/2022   10:59 AM 04/25/2021    3:09 PM 04/19/2020    8:30 AM 04/18/2019    9:51 AM 04/09/2018    8:55 AM 04/06/2017    9:02 AM  PHQ 2/9 Scores  PHQ - 2 Score 0 0 0 0 0 0 0  PHQ- 9 Score  0 0        Fall Risk    05/06/2023   11:27 AM 04/30/2022   11:01 AM 04/25/2021    3:08 PM 04/19/2020    8:30 AM 04/18/2019    9:51 AM  Fall Risk   Falls in the past year? 0 0 0 0 0  Number falls in past yr: 0 0 0 0   Injury with Fall? 0 0 0 0   Risk for fall due to : No Fall Risks No Fall Risks Medication side effect    Follow up Falls prevention discussed;Falls evaluation completed Falls evaluation completed Falls evaluation completed;Falls prevention discussed      MEDICARE RISK AT HOME:  Medicare Risk at Home - 05/06/23 1128      Any stairs in or around the home? Yes    If so, are there any without handrails? No    Home free of loose throw rugs in walkways, pet beds, electrical cords, etc? Yes    Adequate lighting in your home to reduce risk of falls? Yes    Life alert? No    Use of a cane, walker or w/c? No    Grab bars in the bathroom? Yes    Shower chair or bench in shower? No    Elevated toilet seat or a handicapped toilet? No             TIMED UP AND GO:  Was the test performed?  No    Cognitive Function:    04/25/2021    3:13 PM  MMSE - Mini Mental State Exam  Orientation to time 5  Orientation to Place 5  Registration 3  Attention/ Calculation 5  Recall 3  Language- repeat 1        05/06/2023   11:29 AM 04/30/2022   11:02 AM  6CIT Screen  What Year? 0 points 0 points  What month? 0 points 0 points  What time? 0 points 0 points  Count back from 20 0 points 0 points  Months in reverse 0 points 0 points  Repeat phrase 6 points 0 points  Total Score 6 points 0 points    Immunizations Immunization History  Administered Date(s) Administered   Fluad Quad(high Dose 65+) 07/24/2022   Influenza Split 07/16/2011   Influenza Whole  10/06/2001, 07/04/2009   Influenza, High Dose Seasonal PF 07/01/2018, 07/06/2019, 07/17/2021   Influenza,inj,Quad PF,6+ Mos 09/21/2013, 08/03/2015, 07/16/2017   Influenza-Unspecified 07/16/2011, 08/07/2016, 07/01/2018   Moderna Sars-Covid-2 Vaccination 10/19/2019, 11/16/2019   PFIZER(Purple Top)SARS-COV-2 Vaccination 09/07/2020   Pfizer Covid-19 Vaccine Bivalent Booster 49yrs & up 09/04/2021   Pneumococcal Conjugate-13 04/03/2015   Pneumococcal Polysaccharide-23 07/04/2009   Td 10/06/2001, 09/21/2013   Zoster Recombinant(Shingrix) 11/26/2018, 01/21/2019   Zoster, Live 03/03/2007    TDAP status: Up to date  Flu Vaccine status: Up to date  Pneumococcal vaccine status: Up to date  Covid-19 vaccine status: Information provided on how to obtain  vaccines.   Qualifies for Shingles Vaccine? Yes   Zostavax completed Yes   Shingrix Completed?: Yes  Screening Tests Health Maintenance  Topic Date Due   COVID-19 Vaccine (5 - 2023-24 season) 06/06/2022   INFLUENZA VACCINE  05/07/2023   DTaP/Tdap/Td (3 - Tdap) 09/22/2023   Medicare Annual Wellness (AWV)  05/05/2024   Pneumonia Vaccine 66+ Years old  Completed   DEXA SCAN  Completed   Zoster Vaccines- Shingrix  Completed   HPV VACCINES  Aged Out    Health Maintenance  Health Maintenance Due  Topic Date Due   COVID-19 Vaccine (5 - 2023-24 season) 06/06/2022    Colorectal cancer screening: No longer required.   Mammogram status: Completed 09/02/22. Repeat every year  Bone Density status: Completed 04/17/15. Results reflect: Bone density results: OSTEOPENIA. Repeat every pt declined years.  Lung Cancer Screening: (Low Dose CT Chest recommended if Age 68-80 years, 20 pack-year currently smoking OR have quit w/in 15years.) does not qualify.   Lung Cancer Screening Referral: no  Additional Screening:  Hepatitis C Screening: does not qualify; Completed no  Vision Screening: Recommended annual ophthalmology exams for early detection of glaucoma and other disorders of the eye. Is the patient up to date with their annual eye exam?  Yes  Who is the provider or what is the name of the office in which the patient attends annual eye exams? Unknown provider If pt is not established with a provider, would they like to be referred to a provider to establish care? Yes .   Dental Screening: Recommended annual dental exams for proper oral hygiene    Community Resource Referral / Chronic Care Management: CRR required this visit?  No   CCM required this visit?  No     Plan:     I have personally reviewed and noted the following in the patient's chart:   Medical and social history Use of alcohol, tobacco or illicit drugs  Current medications and supplements including opioid  prescriptions. Patient is not currently taking opioid prescriptions. Functional ability and status Nutritional status Physical activity Advanced directives List of other physicians Hospitalizations, surgeries, and ER visits in previous 12 months Vitals Screenings to include cognitive, depression, and falls Referrals and appointments  In addition, I have reviewed and discussed with patient certain preventive protocols, quality metrics, and best practice recommendations. A written personalized care plan for preventive services as well as general preventive health recommendations were provided to patient.     Maryan Puls, LPN   1/61/0960   After Visit Summary: (MyChart) Due to this being a telephonic visit, the after visit summary with patients personalized plan was offered to patient via MyChart   Nurse Notes: none

## 2023-05-06 NOTE — Patient Instructions (Signed)
Whitney Manning , Thank you for taking time to come for your Medicare Wellness Visit. I appreciate your ongoing commitment to your health goals. Please review the following plan we discussed and let me know if I can assist you in the future.   Referrals/Orders/Follow-Ups/Clinician Recommendations: Aim for 30 minutes of exercise or brisk walking, 6-8 glasses of water, and 5 servings of fruits and vegetables each day.   This is a list of the screening recommended for you and due dates:  Health Maintenance  Topic Date Due   COVID-19 Vaccine (5 - 2023-24 season) 06/06/2022   Medicare Annual Wellness Visit  05/01/2023   Flu Shot  05/07/2023   DTaP/Tdap/Td vaccine (3 - Tdap) 09/22/2023   Pneumonia Vaccine  Completed   DEXA scan (bone density measurement)  Completed   Zoster (Shingles) Vaccine  Completed   HPV Vaccine  Aged Out    Advanced directives: (Copy Requested) Please bring a copy of your health care power of attorney and living will to the office to be added to your chart at your convenience.  Next Medicare Annual Wellness Visit scheduled for next year: Yes  Preventive Care 63 Years and Older, Female Preventive care refers to lifestyle choices and visits with your health care provider that can promote health and wellness. What does preventive care include? A yearly physical exam. This is also called an annual well check. Dental exams once or twice a year. Routine eye exams. Ask your health care provider how often you should have your eyes checked. Personal lifestyle choices, including: Daily care of your teeth and gums. Regular physical activity. Eating a healthy diet. Avoiding tobacco and drug use. Limiting alcohol use. Practicing safe sex. Taking low-dose aspirin every day. Taking vitamin and mineral supplements as recommended by your health care provider. What happens during an annual well check? The services and screenings done by your health care provider during your annual well  check will depend on your age, overall health, lifestyle risk factors, and family history of disease. Counseling  Your health care provider may ask you questions about your: Alcohol use. Tobacco use. Drug use. Emotional well-being. Home and relationship well-being. Sexual activity. Eating habits. History of falls. Memory and ability to understand (cognition). Work and work Astronomer. Reproductive health. Screening  You may have the following tests or measurements: Height, weight, and BMI. Blood pressure. Lipid and cholesterol levels. These may be checked every 5 years, or more frequently if you are over 67 years old. Skin check. Lung cancer screening. You may have this screening every year starting at age 22 if you have a 30-pack-year history of smoking and currently smoke or have quit within the past 15 years. Fecal occult blood test (FOBT) of the stool. You may have this test every year starting at age 18. Flexible sigmoidoscopy or colonoscopy. You may have a sigmoidoscopy every 5 years or a colonoscopy every 10 years starting at age 41. Hepatitis C blood test. Hepatitis B blood test. Sexually transmitted disease (STD) testing. Diabetes screening. This is done by checking your blood sugar (glucose) after you have not eaten for a while (fasting). You may have this done every 1-3 years. Bone density scan. This is done to screen for osteoporosis. You may have this done starting at age 30. Mammogram. This may be done every 1-2 years. Talk to your health care provider about how often you should have regular mammograms. Talk with your health care provider about your test results, treatment options, and if necessary, the  need for more tests. Vaccines  Your health care provider may recommend certain vaccines, such as: Influenza vaccine. This is recommended every year. Tetanus, diphtheria, and acellular pertussis (Tdap, Td) vaccine. You may need a Td booster every 10 years. Zoster  vaccine. You may need this after age 62. Pneumococcal 13-valent conjugate (PCV13) vaccine. One dose is recommended after age 70. Pneumococcal polysaccharide (PPSV23) vaccine. One dose is recommended after age 57. Talk to your health care provider about which screenings and vaccines you need and how often you need them. This information is not intended to replace advice given to you by your health care provider. Make sure you discuss any questions you have with your health care provider. Document Released: 10/19/2015 Document Revised: 06/11/2016 Document Reviewed: 07/24/2015 Elsevier Interactive Patient Education  2017 [employer].  Fall Prevention in the Home Falls can cause injuries. They can happen to people of all ages. There are many things you can do to make your home safe and to help prevent falls. What can I do on the outside of my home? Regularly fix the edges of walkways and driveways and fix any cracks. Remove anything that might make you trip as you walk through a door, such as a raised step or threshold. Trim any bushes or trees on the path to your home. Use bright outdoor lighting. Clear any walking paths of anything that might make someone trip, such as rocks or tools. Regularly check to see if handrails are loose or broken. Make sure that both sides of any steps have handrails. Any raised decks and porches should have guardrails on the edges. Have any leaves, snow, or ice cleared regularly. Use sand or salt on walking paths during winter. Clean up any spills in your garage right away. This includes oil or grease spills. What can I do in the bathroom? Use night lights. Install grab bars by the toilet and in the tub and shower. Do not use towel bars as grab bars. Use non-skid mats or decals in the tub or shower. If you need to sit down in the shower, use a plastic, non-slip stool. Keep the floor dry. Clean up any water that spills on the floor as soon as it happens. Remove  soap buildup in the tub or shower regularly. Attach bath mats securely with double-sided non-slip rug tape. Do not have throw rugs and other things on the floor that can make you trip. What can I do in the bedroom? Use night lights. Make sure that you have a light by your bed that is easy to reach. Do not use any sheets or blankets that are too big for your bed. They should not hang down onto the floor. Have a firm chair that has side arms. You can use this for support while you get dressed. Do not have throw rugs and other things on the floor that can make you trip. What can I do in the kitchen? Clean up any spills right away. Avoid walking on wet floors. Keep items that you use a lot in easy-to-reach places. If you need to reach something above you, use a strong step stool that has a grab bar. Keep electrical cords out of the way. Do not use floor polish or wax that makes floors slippery. If you must use wax, use non-skid floor wax. Do not have throw rugs and other things on the floor that can make you trip. What can I do with my stairs? Do not leave any items on the  stairs. Make sure that there are handrails on both sides of the stairs and use them. Fix handrails that are broken or loose. Make sure that handrails are as long as the stairways. Check any carpeting to make sure that it is firmly attached to the stairs. Fix any carpet that is loose or worn. Avoid having throw rugs at the top or bottom of the stairs. If you do have throw rugs, attach them to the floor with carpet tape. Make sure that you have a light switch at the top of the stairs and the bottom of the stairs. If you do not have them, ask someone to add them for you. What else can I do to help prevent falls? Wear shoes that: Do not have high heels. Have rubber bottoms. Are comfortable and fit you well. Are closed at the toe. Do not wear sandals. If you use a stepladder: Make sure that it is fully opened. Do not climb a  closed stepladder. Make sure that both sides of the stepladder are locked into place. Ask someone to hold it for you, if possible. Clearly mark and make sure that you can see: Any grab bars or handrails. First and last steps. Where the edge of each step is. Use tools that help you move around (mobility aids) if they are needed. These include: Canes. Walkers. Scooters. Crutches. Turn on the lights when you go into a dark area. Replace any light bulbs as soon as they burn out. Set up your furniture so you have a clear path. Avoid moving your furniture around. If any of your floors are uneven, fix them. If there are any pets around you, be aware of where they are. Review your medicines with your doctor. Some medicines can make you feel dizzy. This can increase your chance of falling. Ask your doctor what other things that you can do to help prevent falls. This information is not intended to replace advice given to you by your health care provider. Make sure you discuss any questions you have with your health care provider. Document Released: 07/19/2009 Document Revised: 02/28/2016 Document Reviewed: 10/27/2014 Elsevier Interactive Patient Education  2017 [employer].

## 2023-05-13 ENCOUNTER — Telehealth: Payer: Self-pay

## 2023-05-13 NOTE — Patient Outreach (Signed)
  Care Coordination   05/13/2023 Name: MARLENE BADENHOP MRN: [id number] DOB: [birth year]   Care Coordination Outreach Attempts:  An unsuccessful telephone outreach was attempted today to offer the patient information about available care coordination services. Contact answering phone states patient is not at home.  HIPAA compliant message left with call back phone number.   Follow Up Plan:  Additional outreach attempts will be made to offer the patient care coordination information and services.   Encounter Outcome:  Pt. Request to Call Back   Care Coordination Interventions:  No, not indicated    George Ina [facility] Care Coordination [phone] direct line

## 2023-05-13 NOTE — Patient Outreach (Signed)
  Care Coordination   Initial Visit Note   05/13/2023 Name: Whitney Manning MRN: [id number] DOB: 15-Sep-1935  Whitney Manning is a 88 y.o. year old female who sees Joaquim Nam, MD for primary care. I spoke with  Sabino Niemann by phone today.  What matters to the patients health and wellness today?  Patient denies having any nursing or community resource needs.     Goals Addressed             This Visit's Progress    COMPLETED: care coordination activities - no follow up needed.       Interventions Today    Flowsheet Row Most Recent Value  General Interventions   General Interventions Discussed/Reviewed General Interventions Discussed  [Care coordination services discussed. SDOH survey completed. AWV discussed and patient advised to contact provider office to schedule. Discussed vaccines. Advised to contact PCP office if care coordination services needed in the future.]              SDOH assessments and interventions completed:  Yes  SDOH Interventions Today    Flowsheet Row Most Recent Value  SDOH Interventions   Food Insecurity Interventions Intervention Not Indicated  Housing Interventions Intervention Not Indicated  Transportation Interventions Intervention Not Indicated        Care Coordination Interventions:  Yes, provided   Follow up plan: No further intervention required.   Encounter Outcome:  Pt. Visit Completed   George Ina RN,BSN,CCM [facility] Care Coordination [phone] direct line

## 2023-05-14 ENCOUNTER — Encounter: Payer: Self-pay | Admitting: Family Medicine

## 2023-05-14 ENCOUNTER — Ambulatory Visit: Payer: Medicare Other | Admitting: Family Medicine

## 2023-05-14 VITALS — BP 102/68 | HR 91 | Temp 97.8°F | Ht 64.0 in | Wt 119.0 lb

## 2023-05-14 DIAGNOSIS — Z8679 Personal history of other diseases of the circulatory system: Secondary | ICD-10-CM

## 2023-05-14 DIAGNOSIS — R413 Other amnesia: Secondary | ICD-10-CM | POA: Diagnosis not present

## 2023-05-14 LAB — CBC WITH DIFFERENTIAL/PLATELET
Basophils Absolute: 0 10*3/uL (ref 0.0–0.1)
Basophils Relative: 0.6 % (ref 0.0–3.0)
Eosinophils Absolute: 0.1 10*3/uL (ref 0.0–0.7)
Eosinophils Relative: 2.4 % (ref 0.0–5.0)
HCT: 41.5 % (ref 36.0–46.0)
Hemoglobin: 13.5 g/dL (ref 12.0–15.0)
Lymphocytes Relative: 17 % (ref 12.0–46.0)
Lymphs Abs: 1 10*3/uL (ref 0.7–4.0)
MCHC: 32.5 g/dL (ref 30.0–36.0)
MCV: 93.5 fl (ref 78.0–100.0)
Monocytes Absolute: 0.5 10*3/uL (ref 0.1–1.0)
Monocytes Relative: 8.4 % (ref 3.0–12.0)
Neutro Abs: 4.3 10*3/uL (ref 1.4–7.7)
Neutrophils Relative %: 71.6 % (ref 43.0–77.0)
Platelets: 224 10*3/uL (ref 150.0–400.0)
RBC: 4.44 Mil/uL (ref 3.87–5.11)
RDW: 13.4 % (ref 11.5–15.5)
WBC: 5.9 10*3/uL (ref 4.0–10.5)

## 2023-05-14 LAB — COMPREHENSIVE METABOLIC PANEL
ALT: 22 U/L (ref 0–35)
AST: 27 U/L (ref 0–37)
Albumin: 4.6 g/dL (ref 3.5–5.2)
Alkaline Phosphatase: 92 U/L (ref 39–117)
BUN: 18 mg/dL (ref 6–23)
CO2: 30 mEq/L (ref 19–32)
Calcium: 9.7 mg/dL (ref 8.4–10.5)
Chloride: 101 mEq/L (ref 96–112)
Creatinine, Ser: 1.04 mg/dL (ref 0.40–1.20)
GFR: 48.92 mL/min — ABNORMAL LOW (ref >60.00)
Glucose, Bld: 92 mg/dL (ref 70–99)
Potassium: 4.3 mEq/L (ref 3.5–5.1)
Sodium: 137 mEq/L (ref 135–145)
Total Bilirubin: 0.7 mg/dL (ref 0.2–1.2)
Total Protein: 7.2 g/dL (ref 6.0–8.3)

## 2023-05-14 LAB — VITAMIN B12: Vitamin B-12: 288 pg/mL (ref 211–911)

## 2023-05-14 LAB — TSH: TSH: 1.62 u[IU]/mL (ref 0.35–5.50)

## 2023-05-14 MED ORDER — LOSARTAN POTASSIUM 25 MG PO TABS: ORAL_TABLET | ORAL | Status: DC

## 2023-05-14 NOTE — Progress Notes (Signed)
Fatigued.  Can get lightheaded on standing.  Discussed taking 12.5mg  losartan and getting a pill cutter for convenience.  Discussed med dose options.  Still with tingling and "funny feeling" in the R foot.  Not in the L foot.  No R hand sx.    Memory changes d/w pt.  Pt's mother had memory changes, along with patient's older brother. Family noted changes in the last year and more in the last ~2 months.  Short term changes.    Meds, vitals, and allergies reviewed.   ROS: Per HPI unless specifically indicated in ROS section   Nad Ncat Neck supple, no LA CN 2-12 wnl B except for prev procedural changes that affect her smile, S/S wnl x4 Rrr Ctab Abd soft, not ttp Skin well-perfused and pacer insertion pocket well-healed.  MMSE 28/30, -1 for orientation and -1 for recall.  30 minutes were devoted to patient care in this encounter (this includes time spent reviewing the patient's file/history, interviewing and examining the patient, counseling/reviewing plan with patient).

## 2023-05-14 NOTE — Patient Instructions (Addendum)
Stop losartan and update me in about 10 days about your BP and how you feel.   Go to the lab on the way out.   If you have mychart we'll likely use that to update you.    Take care.  Glad to see you.

## 2023-05-17 ENCOUNTER — Other Ambulatory Visit: Payer: Self-pay | Admitting: Family Medicine

## 2023-05-17 DIAGNOSIS — R413 Other amnesia: Secondary | ICD-10-CM | POA: Insufficient documentation

## 2023-05-17 MED ORDER — VITAMIN B-12 1000 MCG PO TABS: 1000.0000 ug | ORAL_TABLET | Freq: Every day | ORAL | Status: DC

## 2023-05-17 NOTE — Assessment & Plan Note (Signed)
Routine cautions given to patient.  See notes on labs to exclude reversible causes.

## 2023-05-17 NOTE — Assessment & Plan Note (Signed)
Likely overtreated. Stop losartan and update me in about 10 days about blood pressure and status.  She agrees.

## 2023-06-02 ENCOUNTER — Encounter: Payer: Self-pay | Admitting: Family Medicine

## 2023-06-08 ENCOUNTER — Other Ambulatory Visit: Payer: Self-pay | Admitting: Family Medicine

## 2023-06-10 ENCOUNTER — Ambulatory Visit: Payer: Medicare Other | Admitting: Cardiology

## 2023-07-07 ENCOUNTER — Other Ambulatory Visit: Payer: Self-pay

## 2023-07-07 DIAGNOSIS — I350 Nonrheumatic aortic (valve) stenosis: Secondary | ICD-10-CM

## 2023-07-07 DIAGNOSIS — E782 Mixed hyperlipidemia: Secondary | ICD-10-CM

## 2023-07-07 DIAGNOSIS — Z953 Presence of xenogenic heart valve: Secondary | ICD-10-CM

## 2023-07-07 DIAGNOSIS — I2510 Atherosclerotic heart disease of native coronary artery without angina pectoris: Secondary | ICD-10-CM

## 2023-07-07 MED ORDER — ATORVASTATIN CALCIUM 40 MG PO TABS
40.0000 mg | ORAL_TABLET | Freq: Every day | ORAL | 0 refills | Status: DC
Start: 2023-07-07 — End: 2023-10-05

## 2023-07-14 ENCOUNTER — Ambulatory Visit: Payer: Medicare Other

## 2023-07-14 DIAGNOSIS — I442 Atrioventricular block, complete: Secondary | ICD-10-CM | POA: Diagnosis not present

## 2023-07-14 LAB — CUP PACEART REMOTE DEVICE CHECK
Battery Remaining Longevity: 127 mo
Battery Remaining Percentage: 95.5 %
Battery Voltage: 3.02 V
Brady Statistic AP VP Percent: 4.1 %
Brady Statistic AP VS Percent: 1 %
Brady Statistic AS VP Percent: 83 %
Brady Statistic AS VS Percent: 12 %
Brady Statistic RA Percent Paced: 4.8 %
Brady Statistic RV Percent Paced: 87 %
Date Time Interrogation Session: 20241008020014
Implantable Lead Connection Status: 753985
Implantable Lead Connection Status: 753985
Implantable Lead Implant Date: 20240708
Implantable Lead Implant Date: 20240708
Implantable Lead Location: 753859
Implantable Lead Location: 753860
Implantable Pulse Generator Implant Date: 20240708
Lead Channel Impedance Value: 510 Ohm
Lead Channel Impedance Value: 580 Ohm
Lead Channel Pacing Threshold Amplitude: 0.75 V
Lead Channel Pacing Threshold Amplitude: 1 V
Lead Channel Pacing Threshold Pulse Width: 0.5 ms
Lead Channel Pacing Threshold Pulse Width: 0.5 ms
Lead Channel Sensing Intrinsic Amplitude: 12 mV
Lead Channel Sensing Intrinsic Amplitude: 3.2 mV
Lead Channel Setting Pacing Amplitude: 1 V
Lead Channel Setting Pacing Amplitude: 2 V
Lead Channel Setting Pacing Pulse Width: 0.5 ms
Lead Channel Setting Sensing Sensitivity: 4 mV
Pulse Gen Model: 2272
Pulse Gen Serial Number: 5827861

## 2023-07-17 ENCOUNTER — Encounter: Payer: Medicare Other | Admitting: Cardiovascular Disease

## 2023-07-30 NOTE — Progress Notes (Signed)
Remote pacemaker transmission.   

## 2023-08-18 ENCOUNTER — Encounter: Payer: Self-pay | Admitting: Cardiovascular Disease

## 2023-08-18 ENCOUNTER — Ambulatory Visit: Payer: Medicare Other | Attending: Cardiovascular Disease | Admitting: Cardiovascular Disease

## 2023-08-18 VITALS — BP 122/86 | HR 78 | Ht 64.0 in | Wt 123.0 lb

## 2023-08-18 DIAGNOSIS — I442 Atrioventricular block, complete: Secondary | ICD-10-CM

## 2023-08-18 DIAGNOSIS — I5022 Chronic systolic (congestive) heart failure: Secondary | ICD-10-CM

## 2023-08-18 LAB — CUP PACEART INCLINIC DEVICE CHECK
Battery Remaining Longevity: 129 mo
Battery Voltage: 3.02 V
Brady Statistic RA Percent Paced: 7 %
Brady Statistic RV Percent Paced: 90 %
Date Time Interrogation Session: 20241112103046
Implantable Lead Connection Status: 753985
Implantable Lead Connection Status: 753985
Implantable Lead Implant Date: 20240708
Implantable Lead Implant Date: 20240708
Implantable Lead Location: 753859
Implantable Lead Location: 753860
Implantable Pulse Generator Implant Date: 20240708
Lead Channel Impedance Value: 487.5 Ohm
Lead Channel Impedance Value: 575 Ohm
Lead Channel Pacing Threshold Amplitude: 0.75 V
Lead Channel Pacing Threshold Amplitude: 0.75 V
Lead Channel Pacing Threshold Amplitude: 0.75 V
Lead Channel Pacing Threshold Amplitude: 0.75 V
Lead Channel Pacing Threshold Pulse Width: 0.5 ms
Lead Channel Pacing Threshold Pulse Width: 0.5 ms
Lead Channel Pacing Threshold Pulse Width: 0.5 ms
Lead Channel Pacing Threshold Pulse Width: 0.5 ms
Lead Channel Sensing Intrinsic Amplitude: 12 mV
Lead Channel Sensing Intrinsic Amplitude: 3.1 mV
Lead Channel Setting Pacing Amplitude: 1 V
Lead Channel Setting Pacing Amplitude: 1.875
Lead Channel Setting Pacing Pulse Width: 0.5 ms
Lead Channel Setting Sensing Sensitivity: 4 mV
Pulse Gen Model: 2272
Pulse Gen Serial Number: 5827861

## 2023-08-18 NOTE — Progress Notes (Signed)
  Electrophysiology Office Note:    Date:  08/18/2023   ID:  Whitney Manning, DOB 07-06-1935, MRN [id number]  PCP:  Joaquim Nam, MD   [facility] Providers Cardiologist:  Meriam Sprague, MD (Inactive)     Referring MD: Joaquim Nam, MD   History of Present Illness:    Whitney Manning is a 88 y.o. female with a medical history significant for second-degree heart block status post placement of an Abbott dual-chamber pacemaker, history of aortic valve repair who presents for device follow-up.     She was admitted in July 2024 with symptomatic second-degree heart block and underwent placement of an Abbott dual-chamber pacemaker with a left bundle branch lead.  Echo showed slightly reduced EF at 45 to 50%.  she has no device related complaints -- no new tenderness, drainage, redness.      Today, she is doing very well and has no complaints.  EKGs/Labs/Other Studies Reviewed Today:     Echocardiogram:  TTE April 11, 2023 EF 4550%, mild RV dysfunction, mild biatrial enlargement.  Severe mitral valve calcification without stenosis     EKG:   EKG Interpretation Date/Time:  Tuesday August 18 2023 10:05:19 EST Ventricular Rate:  78 PR Interval:  182 QRS Duration:  136 QT Interval:  434 QTC Calculation: 494 R Axis:   91  Text Interpretation: Atrial-sensed ventricular-paced rhythm When compared with ECG of 14-Apr-2023 06:44, Vent. rate has increased BY   9 BPM Confirmed by York Pellant [phone]) on 08/18/2023 10:19:41 AM     Physical Exam:    VS:  BP 122/86 (BP Location: Left Arm, Patient Position: Sitting, Cuff Size: Normal)   Pulse 78   Ht 5\' 4"  (1.626 m)   Wt 123 lb (55.8 kg)   SpO2 98%   BMI 21.11 kg/m     Wt Readings from Last 3 Encounters:  08/18/23 123 lb (55.8 kg)  05/14/23 119 lb (54 kg)  05/06/23 120 lb (54.4 kg)     GEN: Well nourished, well developed in no acute distress CARDIAC: RRR The device site is normal -- no tenderness,  edema, drainage, redness, threatened erosion.  RESPIRATORY:  Normal work of breathing MUSCULOSKELETAL: no edema    ASSESSMENT & PLAN:     Second-degree AV block Abbott dual-chamber pacemaker in place, normal function I reviewed the device interrogation today.  See Paceart for details She is not device dependent today Continue remote monitoring checks  History of aortic valve repair She will continue to follow in general cardiology clinic  Mildly reduced EF No signs/symptoms of CHF Will check TTE periodically     Signed, Maurice Small, MD  08/18/2023 10:27 AM    [facility]

## 2023-08-18 NOTE — Patient Instructions (Signed)
Medication Instructions:  Your physician recommends that you continue on your current medications as directed. Please refer to the Current Medication list given to you today. *If you need a refill on your cardiac medications before your next appointment, please call your pharmacy*   Follow-Up: At [facility], you and your health needs are our priority.  As part of our continuing mission to provide you with exceptional heart care, we have created designated Provider Care Teams.  These Care Teams include your primary Cardiologist (physician) and Advanced Practice Providers (APPs -  Physician Assistants and Nurse Practitioners) who all work together to provide you with the care you need, when you need it.  We recommend signing up for the patient portal called "MyChart".  Sign up information is provided on this After Visit Summary.  MyChart is used to connect with patients for Virtual Visits (Telemedicine).  Patients are able to view lab/test results, encounter notes, upcoming appointments, etc.  Non-urgent messages can be sent to your provider as well.   To learn more about what you can do with MyChart, go to [email or web address].    Your next appointment:   6 month(s)  Provider:   You will see one of the following Advanced Practice Providers on your designated Care Team:   Francis Dowse, Charlott Holler "Mardelle Matte" [employer], [place] Sherie Don, NP Canary Brim, NP  Then, Dr Nelly Laurence will plan to see you again in 1 year(s).

## 2023-08-31 ENCOUNTER — Other Ambulatory Visit: Payer: Self-pay

## 2023-08-31 DIAGNOSIS — E782 Mixed hyperlipidemia: Secondary | ICD-10-CM

## 2023-08-31 DIAGNOSIS — Z79899 Other long term (current) drug therapy: Secondary | ICD-10-CM

## 2023-08-31 DIAGNOSIS — I2510 Atherosclerotic heart disease of native coronary artery without angina pectoris: Secondary | ICD-10-CM

## 2023-08-31 MED ORDER — EZETIMIBE 10 MG PO TABS
10.0000 mg | ORAL_TABLET | Freq: Every day | ORAL | 2 refills | Status: DC
Start: 2023-08-31 — End: 2024-05-18

## 2023-09-21 ENCOUNTER — Encounter: Payer: Self-pay | Admitting: Cardiovascular Disease

## 2023-09-21 ENCOUNTER — Ambulatory Visit: Payer: Medicare Other | Attending: Cardiology | Admitting: Cardiovascular Disease

## 2023-09-21 VITALS — BP 110/66 | HR 92 | Ht 64.0 in | Wt 123.0 lb

## 2023-09-21 DIAGNOSIS — I10 Essential (primary) hypertension: Secondary | ICD-10-CM

## 2023-09-21 DIAGNOSIS — Z953 Presence of xenogenic heart valve: Secondary | ICD-10-CM | POA: Diagnosis not present

## 2023-09-21 DIAGNOSIS — Z950 Presence of cardiac pacemaker: Secondary | ICD-10-CM

## 2023-09-21 DIAGNOSIS — I441 Atrioventricular block, second degree: Secondary | ICD-10-CM

## 2023-09-21 DIAGNOSIS — I2510 Atherosclerotic heart disease of native coronary artery without angina pectoris: Secondary | ICD-10-CM

## 2023-09-21 DIAGNOSIS — Z85118 Personal history of other malignant neoplasm of bronchus and lung: Secondary | ICD-10-CM

## 2023-09-21 DIAGNOSIS — E7800 Pure hypercholesterolemia, unspecified: Secondary | ICD-10-CM

## 2023-09-21 DIAGNOSIS — I5022 Chronic systolic (congestive) heart failure: Secondary | ICD-10-CM

## 2023-09-21 NOTE — Progress Notes (Signed)
Cardiology Office Note:    Date:  09/25/2023   ID:  Whitney Manning, DOB Jun 28, 1935, MRN [id number]  PCP:  Joaquim Nam, MD   [facility] Providers Cardiologist:  Meriam Sprague, MD (Inactive) Electrophysiologist:  Maurice Small, MD     Referring MD: Joaquim Nam, MD   Chief Complaint  Patient presents with   Cardiac Valve Problem   Coronary Artery Disease  Establish new cardiology follow-up  History of Present Illness:    Whitney Manning is a 88 y.o. female with a hx of bioprosthetic aortic valve replacement and ascending aorta and aortic arch repair (2010), CAD s/p CABG (LIMA-LAD at time of AVR). 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 Devin Going departure.  Her husband Whitney Manning is also establishing as my patient.  She is accompanied today by her daughter-in-law.   She feels very well.  She has no cardiovascular complaints.  NYHA functional class I.  The patient specifically denies any chest pain at rest or with exertion, dyspnea at rest or with exertion, orthopnea, paroxysmal nocturnal dyspnea, syncope, palpitations, focal neurological deficits, intermittent claudication, lower extremity edema, unexplained weight gain, cough, hemoptysis or wheezing.  She is quite hard of hearing.  She has not had any falls or bleeding problems.  Her most recent hemoglobin is 13.5.  She has an excellent lipid profile with an HDL 71 and LDL of 58, normal triglycerides, and treatment with atorvastatin and Zetia.  Her blood pressure is very well-controlled.  She does not have diabetes mellitus and she has never smoked.  Her most recent  echocardiogram in July 2024 showed LVEF 45-50% with mild global hypokinesis and indeterminant diastolic parameters.  The right ventricle was also described as having mildly reduced systolic function and both atria are mildly dilated.  There were no other valvular abnormalities of significance.  In March 2024 she had a normal nuclear stress test with an EF calculated at 58%.  She was intolerant of Entresto.  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)   CHOLECYSTECTOMY     COLONOSCOPY     LUNG LOBECTOMY  2016   at [facility], for pulmonary nodule   PACEMAKER IMPLANT N/A 04/13/2023   Procedure: PACEMAKER IMPLANT;  Surgeon: Maurice Small, MD;  Location: [facility] INVASIVE CV LAB;  Service: Cardiovascular;  Laterality: N/A;   salivary gland surgery for cancer on the left parotid      Current Medications: No outpatient medications have been marked as taking for the 09/21/23 encounter (Office Visit) with Thurmon Fair, MD.     Allergies:   Entresto [sacubitril-valsartan], Fosamax [alendronate sodium], and Lopressor [metoprolol]  Social History   Socioeconomic History   Marital status: Married    Spouse name: Not on file   Number of children: Not on file   Years of education: Not on file   Highest education level: Not on file  Occupational History   Not on file  Tobacco Use   Smoking status: Never   Smokeless tobacco: Never  Substance and Sexual Activity   Alcohol use: Yes    Comment: rare   Drug use: No    Sexual activity: Yes  Other Topics Concern   Not on file  Social History Narrative   All siblings deceased   First husband died of colon cancer.  Remarried 1998.    1 son and 1 grandson   Retired, prev worked with Community education officer, then at [employer]   Social Drivers of [employer]: Low Risk  (05/06/2023)   Overall Financial Resource Strain (CARDIA)    Difficulty of Paying Living Expenses: Not hard at all  Food Insecurity: No Food Insecurity (05/13/2023)   Hunger Vital Sign    Worried About Running Out of Food in the Last Year: Never true    Ran Out of Food in the Last Year: Never true  Transportation Needs: No Transportation Needs (05/13/2023)   PRAPARE - Administrator, Civil Service (Medical): No    Lack of Transportation (Non-Medical): No  Physical Activity: Insufficiently Active (05/06/2023)   Exercise Vital Sign    Days of Exercise per Week: 3 days    Minutes of Exercise per Session: 20 min  Stress: No Stress Concern Present (05/06/2023)   [employer] of Occupational Health - Occupational Stress Questionnaire    Feeling of Stress : Not at all  Social Connections: Moderately Integrated (05/06/2023)   Social Connection and Isolation Panel [NHANES]    Frequency of Communication with Friends and Family: More than three times a week    Frequency of Social Gatherings with Friends and Family: More than three times a week    Attends Religious Services: More than 4 times per year    Active Member of [employer] or Organizations: No    Attends Banker Meetings: Never    Marital Status: Married     Family History: The patient's family history includes Heart disease in her father; Stroke in her mother. There is no history of Breast cancer or Colon cancer.  ROS:   Please see the history of present illness.     All other systems reviewed and are negative.  EKGs/Labs/Other Studies Reviewed:    The following studies were reviewed  today: Echocardiogram July 2024:   1. Patient appears to have 2:1 AV block during exam.   2. Septal hypokinesis . Left ventricular ejection fraction, by  estimation, is 45 to 50%. The left ventricle has mildly decreased  function. The left ventricle demonstrates global hypokinesis. Left  ventricular diastolic parameters are indeterminate.   3. Right ventricular systolic function is mildly reduced. The right  ventricular size is mildly enlarged.   4. Left atrial size was mildly dilated.   5. Right atrial size was mildly dilated.   6. The mitral valve is degenerative. No evidence of mitral valve  regurgitation. No evidence of mitral stenosis. Severe mitral annular  calcification.   7. The aortic valve has been repaired/replaced. Aortic valve  regurgitation is not visualized. No aortic stenosis is present.   8. The inferior vena cava is normal in size with <50% respiratory  variability,  suggesting right atrial pressure of 8 mmHg.       Recent Labs: 04/10/2023: B Natriuretic Peptide 768.4 05/14/2023: ALT 22; BUN 18; Creatinine, Ser 1.04; Hemoglobin 13.5; Platelets 224.0; Potassium 4.3; Sodium 137; TSH 1.62  Recent Lipid Panel    Component Value Date/Time   CHOL 143 03/11/2023 0843   TRIG 67 03/11/2023 0843   TRIG 93 10/12/2006 1008   HDL 71 03/11/2023 0843   CHOLHDL 2.0 03/11/2023 0843   CHOLHDL 3 05/01/2022 0958   VLDL 20.0 05/01/2022 0958   LDLCALC 58 03/11/2023 0843   LDLDIRECT 131.6 09/16/2012 1131     Risk Assessment/Calculations:             Physical Exam:    VS:  BP 110/66   Pulse 92   Ht 5\' 4"  (1.626 m)   Wt 123 lb (55.8 kg)   SpO2 95%   BMI 21.11 kg/m     Wt Readings from Last 3 Encounters:  09/21/23 123 lb (55.8 kg)  08/18/23 123 lb (55.8 kg)  05/14/23 119 lb (54 kg)     GEN: Very thin and petite, well nourished, well developed in no acute distress HEENT: Normal NECK: No JVD; No carotid bruits LYMPHATICS: No lymphadenopathy CARDIAC: Normal S1 and  S2, RRR, very faint systolic ejection murmur, no diastolic murmurs, rubs, gallops RESPIRATORY:  Clear to auscultation without rales, wheezing or rhonchi  ABDOMEN: Soft, non-tender, non-distended MUSCULOSKELETAL:  No edema; No deformity  SKIN: Warm and dry NEUROLOGIC:  Alert and oriented x 3 PSYCHIATRIC:  Normal affect   ASSESSMENT:    1. S/P aortic valve replacement with porcine valve   2. Coronary artery disease involving native coronary artery of native heart without angina pectoris   3. Chronic systolic heart failure (HCC)   4. Second degree atrioventricular block   5. Pacemaker   6. Hypercholesterolemia   7. Essential hypertension   8. History of lung cancer    PLAN:    In order of problems listed above:  AVR: Normal prosthetic function by echo in July of this year.  Aware of the need for endocarditis prophylaxis. CAD: Asymptomatic, discovered at the time of catheterization before AVR CHF: Minimally depressed left ventricular systolic function possibly in part due to abnormal electrical activation.  NYHA functional class I and euvolemic without diuretics. 2nd deg AV block: Had 90% atrial pacing at her last device download. Pacemaker: Normal device function, followed by Dr. Nelly Laurence. HLP: All lipid parameters are excellent on the current statin regimen.  Continue. HTN: Excellent control History of lung cancer: s/p lobectomy and XRT with curative intent in 2016.           Medication Adjustments/Labs and Tests Ordered: Current medicines are reviewed at length with the patient today.  Concerns regarding medicines are outlined above.  No orders of the defined types were placed in this encounter.  No orders of the defined types were placed in this encounter.   Patient Instructions  Medication Instructions:  No changes *If you need a refill on your cardiac medications before your next appointment, please call your pharmacy*  Follow-Up: At [facility], you and  your health needs are our priority.  As part of our continuing mission to provide you with exceptional heart care, we have created designated Provider Care Teams.  These Care Teams include your primary Cardiologist (physician) and Advanced Practice Providers (APPs -  Physician Assistants and Nurse Practitioners) who all work together to provide you with the care you need,  when you need it.  We recommend signing up for the patient portal called "MyChart".  Sign up information is provided on this After Visit Summary.  MyChart is used to connect with patients for Virtual Visits (Telemedicine).  Patients are able to view lab/test results, encounter notes, upcoming appointments, etc.  Non-urgent messages can be sent to your provider as well.   To learn more about what you can do with MyChart, go to [email or web address].    Your next appointment:   6 month(s)  Provider:   Dr Royann Shivers    Signed, Thurmon Fair, MD  09/25/2023 9:02 PM    [facility]

## 2023-09-21 NOTE — Patient Instructions (Signed)
Medication Instructions:  No changes *If you need a refill on your cardiac medications before your next appointment, please call your pharmacy*  Follow-Up: At [facility], you and your health needs are our priority.  As part of our continuing mission to provide you with exceptional heart care, we have created designated Provider Care Teams.  These Care Teams include your primary Cardiologist (physician) and Advanced Practice Providers (APPs -  Physician Assistants and Nurse Practitioners) who all work together to provide you with the care you need, when you need it.  We recommend signing up for the patient portal called "MyChart".  Sign up information is provided on this After Visit Summary.  MyChart is used to connect with patients for Virtual Visits (Telemedicine).  Patients are able to view lab/test results, encounter notes, upcoming appointments, etc.  Non-urgent messages can be sent to your provider as well.   To learn more about what you can do with MyChart, go to [email or web address].    Your next appointment:   6 month(s)  Provider:   Dr Royann Shivers

## 2023-09-25 ENCOUNTER — Encounter: Payer: Self-pay | Admitting: Cardiovascular Disease

## 2023-10-03 ENCOUNTER — Other Ambulatory Visit: Payer: Self-pay | Admitting: Cardiovascular Disease

## 2023-10-03 DIAGNOSIS — I2510 Atherosclerotic heart disease of native coronary artery without angina pectoris: Secondary | ICD-10-CM

## 2023-10-03 DIAGNOSIS — E782 Mixed hyperlipidemia: Secondary | ICD-10-CM

## 2023-10-03 DIAGNOSIS — Z953 Presence of xenogenic heart valve: Secondary | ICD-10-CM

## 2023-10-03 DIAGNOSIS — I350 Nonrheumatic aortic (valve) stenosis: Secondary | ICD-10-CM

## 2023-10-13 ENCOUNTER — Ambulatory Visit (INDEPENDENT_AMBULATORY_CARE_PROVIDER_SITE_OTHER): Payer: Medicare Other

## 2023-10-13 DIAGNOSIS — I441 Atrioventricular block, second degree: Secondary | ICD-10-CM | POA: Diagnosis not present

## 2023-10-14 LAB — CUP PACEART REMOTE DEVICE CHECK
Battery Remaining Longevity: 120 mo
Battery Remaining Percentage: 95.5 %
Battery Voltage: 3.01 V
Brady Statistic AP VP Percent: 9.2 %
Brady Statistic AP VS Percent: 1 %
Brady Statistic AS VP Percent: 91 %
Brady Statistic AS VS Percent: 1 %
Brady Statistic RA Percent Paced: 9.1 %
Brady Statistic RV Percent Paced: 99 %
Date Time Interrogation Session: 20250106020014
Implantable Lead Connection Status: 753985
Implantable Lead Connection Status: 753985
Implantable Lead Implant Date: 20240708
Implantable Lead Implant Date: 20240708
Implantable Lead Location: 753859
Implantable Lead Location: 753860
Implantable Pulse Generator Implant Date: 20240708
Lead Channel Impedance Value: 450 Ohm
Lead Channel Impedance Value: 560 Ohm
Lead Channel Pacing Threshold Amplitude: 0.625 V
Lead Channel Pacing Threshold Amplitude: 0.875 V
Lead Channel Pacing Threshold Pulse Width: 0.5 ms
Lead Channel Pacing Threshold Pulse Width: 0.5 ms
Lead Channel Sensing Intrinsic Amplitude: 12 mV
Lead Channel Sensing Intrinsic Amplitude: 3 mV
Lead Channel Setting Pacing Amplitude: 1.125
Lead Channel Setting Pacing Amplitude: 1.625
Lead Channel Setting Pacing Pulse Width: 0.5 ms
Lead Channel Setting Sensing Sensitivity: 4 mV
Pulse Gen Model: 2272
Pulse Gen Serial Number: 5827861

## 2023-11-19 ENCOUNTER — Encounter: Payer: Self-pay | Admitting: Family Medicine

## 2023-11-24 NOTE — Progress Notes (Signed)
 Remote pacemaker transmission.

## 2023-11-24 NOTE — Addendum Note (Signed)
Addended by: Geralyn Flash D on: 11/24/2023 12:25 PM   Modules accepted: Orders

## 2023-11-30 ENCOUNTER — Ambulatory Visit
Admission: RE | Admit: 2023-11-30 | Discharge: 2023-11-30 | Disposition: A | Discharge status: Home / Self Care | Payer: Medicare Other | Source: Ambulatory Visit | Attending: Family Medicine | Admitting: Family Medicine

## 2023-11-30 ENCOUNTER — Ambulatory Visit: Payer: Medicare Other | Admitting: Family Medicine

## 2023-11-30 VITALS — BP 160/82 | HR 87 | Temp 97.8°F | Ht 64.0 in | Wt 114.0 lb

## 2023-11-30 DIAGNOSIS — R413 Other amnesia: Secondary | ICD-10-CM

## 2023-11-30 NOTE — Progress Notes (Unsigned)
 D/w pt about restarting aspirin.    FH memory loss, mother and brother.  Family noted short term changes.  Repeating older stories.  Husband has been sick, stressor d/w pt.  She has been sleeping on the couch, looking after him (he had surgery recently).  Weight loss noted.  Taste affected by prev surgery.    Staying at home with her husband.  She isn't driving.  Meds, vitals, and allergies reviewed.   ROS: Per HPI unless specifically indicated in ROS section

## 2023-11-30 NOTE — Patient Instructions (Signed)
 CT scan when possible.  We'll update you about that.   Let me know if I can help with a conversation with your husband about your level of care at home.   Take care.  Glad to see you. Restart aspirin.  Let me know if you have any bleeding.

## 2023-12-02 ENCOUNTER — Other Ambulatory Visit: Payer: Self-pay | Admitting: Family Medicine

## 2023-12-02 DIAGNOSIS — R413 Other amnesia: Secondary | ICD-10-CM | POA: Insufficient documentation

## 2023-12-02 MED ORDER — DONEPEZIL HCL 5 MG PO TABS: 5.0000 mg | ORAL_TABLET | Freq: Every day | ORAL | Status: DC

## 2023-12-02 NOTE — Assessment & Plan Note (Signed)
 MMSE 28/30, -1 for orientation and -1 for recall (05/2023)  MMSE 24/30, -1 orientation, -2 attention, -3 recall. (11/2023)  Check CT, rationale d/w pt.  See notes in imaging.   Sleep deprivation and caring for her husband likely contribute but she had some changes that predate recent care concerns.   Staying at home with her husband. She isn't driving. Discussed her home situation in general, ie for her to decide about what level of care she and her husband would need, if they want to stay at home or relocate, etc. I didn't give her specific directions other than to consider her situation with her family.   Prev labs noted.  Ddx d/w pt.  See CT report.   40 minutes were devoted to patient care in this encounter (this includes time spent reviewing the patient's file/history, interviewing and examining the patient, counseling/reviewing plan with patient).

## 2023-12-04 ENCOUNTER — Telehealth: Payer: Self-pay | Admitting: Family Medicine

## 2023-12-04 NOTE — Telephone Encounter (Signed)
 See CT Head results for further communication

## 2023-12-04 NOTE — Telephone Encounter (Signed)
 Copied from CRM [id number]. Topic: General - Call Back - No Documentation >> Dec 03, 2023  5:08 PM Denese Killings wrote: Reason for CRM: Patient is returning a call from Ava.

## 2023-12-07 ENCOUNTER — Telehealth: Payer: Self-pay

## 2023-12-07 DIAGNOSIS — R413 Other amnesia: Secondary | ICD-10-CM

## 2023-12-07 NOTE — Telephone Encounter (Signed)
 Returned call to patients daughter in law after speaking with patient. Did advise Beth of all of Dr. Lianne Bushy recommendations and they are agreeable to the medication and the referral

## 2023-12-07 NOTE — Telephone Encounter (Signed)
 Copied from CRM [id number]. Topic: Clinical - Lab/Test Results >> Dec 07, 2023 11:09 AM Prudencio Pair wrote: Reason for CRM: Patient's daughter-in-law, Ashley Mariner, called in stating she brought her mother-in-law in last week for a visit & CT scans. States she hasn't heard from anyone in regards to results. Wants to see if they are needing to come in to see Dr. Para March to discuss the results & also wants to know what is the next steps. Please give her a call back to advise. CB #: [phone].

## 2023-12-09 ENCOUNTER — Telehealth: Payer: Self-pay

## 2023-12-09 NOTE — Telephone Encounter (Signed)
 Copied from CRM [id number]. Topic: Clinical - Medical Advice >> Dec 09, 2023  2:22 PM Martinique E wrote: Reason for CRM: Patient's daughter-in-law, Waynetta Sandy, called in regarding patient's sleep deprivation. Waynetta Sandy is wanting to know what route to take in order to address patient's sleep deprivation issues.

## 2023-12-09 NOTE — Telephone Encounter (Signed)
 Spoke with patients daughter in law and at this time the patient is not agreeable to the medication. But to please place the referral to neurology. When the referral is placed can add for them to call the daughter in law [phone] Ashley Mariner.   At this time they not sure what to do if she is refusing the medication

## 2023-12-09 NOTE — Telephone Encounter (Signed)
 Left message for Whitney Manning to return call to me.

## 2023-12-09 NOTE — Telephone Encounter (Signed)
 Closing encounter issue was addressed in another note

## 2023-12-09 NOTE — Telephone Encounter (Signed)
 I put in the referral.  She has the option to defer med start.  If she changes her mind, please let me know.  Thanks.

## 2023-12-09 NOTE — Addendum Note (Signed)
 Addended by: Joaquim Nam on: 12/09/2023 06:00 PM   Modules accepted: Orders

## 2023-12-10 ENCOUNTER — Encounter: Payer: Self-pay | Admitting: Physician Assistant

## 2023-12-10 NOTE — Telephone Encounter (Signed)
 Daughter in law Waynetta Sandy has been notified. Okay per Elease Hashimoto to advise.

## 2024-01-12 ENCOUNTER — Ambulatory Visit (INDEPENDENT_AMBULATORY_CARE_PROVIDER_SITE_OTHER): Payer: Medicare Other

## 2024-01-12 DIAGNOSIS — I442 Atrioventricular block, complete: Secondary | ICD-10-CM | POA: Diagnosis not present

## 2024-01-13 LAB — CUP PACEART REMOTE DEVICE CHECK
Battery Remaining Longevity: 118 mo
Battery Remaining Percentage: 95.5 %
Battery Voltage: 3.01 V
Brady Statistic AP VP Percent: 6.8 %
Brady Statistic AP VS Percent: 1 %
Brady Statistic AS VP Percent: 92 %
Brady Statistic AS VS Percent: 1 %
Brady Statistic RA Percent Paced: 6.7 %
Brady Statistic RV Percent Paced: 99 %
Date Time Interrogation Session: 20250408020014
Implantable Lead Connection Status: 753985
Implantable Lead Connection Status: 753985
Implantable Lead Implant Date: 20240708
Implantable Lead Implant Date: 20240708
Implantable Lead Location: 753859
Implantable Lead Location: 753860
Implantable Pulse Generator Implant Date: 20240708
Lead Channel Impedance Value: 490 Ohm
Lead Channel Impedance Value: 540 Ohm
Lead Channel Pacing Threshold Amplitude: 0.875 V
Lead Channel Pacing Threshold Amplitude: 0.875 V
Lead Channel Pacing Threshold Pulse Width: 0.5 ms
Lead Channel Pacing Threshold Pulse Width: 0.5 ms
Lead Channel Sensing Intrinsic Amplitude: 11.1 mV
Lead Channel Sensing Intrinsic Amplitude: 2.6 mV
Lead Channel Setting Pacing Amplitude: 1.125
Lead Channel Setting Pacing Amplitude: 1.875
Lead Channel Setting Pacing Pulse Width: 0.5 ms
Lead Channel Setting Sensing Sensitivity: 4 mV
Pulse Gen Model: 2272
Pulse Gen Serial Number: 5827861

## 2024-01-15 ENCOUNTER — Ambulatory Visit: Admitting: Physician Assistant

## 2024-01-15 ENCOUNTER — Encounter: Payer: Self-pay | Admitting: Physician Assistant

## 2024-01-15 ENCOUNTER — Ambulatory Visit

## 2024-01-15 VITALS — BP 124/74 | HR 86 | Resp 20 | Ht 64.0 in

## 2024-01-15 DIAGNOSIS — R413 Other amnesia: Secondary | ICD-10-CM

## 2024-01-15 MED ORDER — MEMANTINE HCL 5 MG PO TABS: ORAL_TABLET | ORAL | 11 refills | Status: DC

## 2024-01-15 NOTE — Progress Notes (Addendum)
 Assessment/Plan:     Whitney Manning is a very pleasant 89 y.o. 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 MoCA today is 18/30 .  Workup is in progress.  Prior CT scan of the head was unremarkable, a age-related changes without acute findings.  Patient is undergoing significant amount of stress, which may very well add to her memory concerns.  She is able to participate in her ADLs, but has no motivation to do so.    Memory Impairment  MRI brain without contrast to assess for underlying structural abnormality and assess vascular load  Neurocognitive testing to further evaluate cognitive concerns and determine other underlying cause of memory changes, including potential contribution from sleep, anxiety, attention, or depression among others   Start Memantine 5 mg: Take 1 tablet (5 mg at night) for 2 weeks, then increase to 1 tablet (5 mg) twice a day, side effects discussed Replenish B12 (288) Consider psychotherapy for situational anxiety and depression Recommend good control of cardiovascular risk factors.   Continue to control mood as per PCP Check hearing to help with comprehension Increase social and physical activities [employer], [employer]) [place] up in 2 months  Subjective:    The patient is accompanied by her family who supplements the history.    How long did patient have memory difficulties?  For about 1 year.  Patient reports some difficulty remembering new information, recent conversations, names. She no longer does calligraphy, read, crossword puzzles and not interested. repeats oneself?  Endorsed, especially over stories Disoriented when walking into a room?  Patient denies   Leaving objects in unusual places?  Per daughter reports that she lost the phone and found it  behind the dishes (but she was hiding it).  Wandering behavior? denies   Any personality changes, or depression, anxiety?  Husband is ill, which brought significant amount of stress and depression, agitation, irritability to her."I don't want to do anything". She does not get help of her children and it brings more stress to her.  Hallucinations or paranoia? Denies hallucinations. Paranoid about her phone, she is afraid of anyone, of strangers so she hides the phone so she can use it "just in case ".    Seizures? denies    Any sleep changes?  Does not sleep well, has been caring for her husband.  Denies vivid dreams, REM behavior or sleepwalking   Sleep apnea? Denies.   Any hygiene concerns?  Denies.   Independent of bathing and dressing? Endorsed  Does the patient need help with medications? Patient is in charge   Who is in charge of the finances?  Her husband is in charge     Any changes in appetite?  Eats because she has to. If her husband does not  want to, she does not eat. Patient have trouble swallowing?  Denies.   Does the patient cook? I used to cook, but I don't like eating as much. Any headaches?  Denies.   Chronic pain? Denies.   Ambulates with difficulty? Denies    Recent falls or head injuries? Denies.     Vision changes?  Denies any new issues.  Any strokelike symptoms? Denies.   Any tremors? Denies.   Any anosmia? Endorsed, since XRT to the neck.  Any incontinence of urine? Denies.   Any  bowel dysfunction? Denies.      Patient lives with husband   History of heavy alcohol intake? Denies.   History of heavy tobacco use? Denies.   Family history of dementia?  Mother and her older brother had dementia ?type   Does patient drive? No longer drives, due to increased stress and sleep   CT of the head personally reviewed 11/30/2023 without acute intracranial abnormality, mild cerebral atrophy within normal limits for age, normal ventricles, mild chronic small vessel ischemic  changes.  Allergies  Allergen Reactions   Entresto [Sacubitril-Valsartan] Other (See Comments)    Dizziness  Lightheaded    Fosamax [Alendronate Sodium] Other (See Comments)    Epistaxis    Lopressor [Metoprolol] Itching    Current Outpatient Medications  Medication Instructions   acetaminophen (TYLENOL) 650 mg, Oral, Every 4 hours PRN   aspirin EC 81 mg, Oral, Daily, Swallow whole.   atorvastatin (LIPITOR) 40 mg, Oral, Daily   Coenzyme Q10 (COQ10 PO) 1 capsule, Daily   cyanocobalamin (VITAMIN B12) 1,000 mcg, Oral, Daily   ezetimibe (ZETIA) 10 mg, Oral, Daily   memantine (NAMENDA) 5 MG tablet Take 1 tablet (5 mg at night) for 2 weeks, then increase to 1 tablet (5 mg) twice a day   Vitamin D 2,000 Units, Oral, Daily     VITALS:   Vitals:   01/15/24 0931  BP: 124/74  Pulse: 86  Resp: 20  SpO2: 97%  Height: 5\' 4"  (1.626 m)      PHYSICAL EXAM   HEENT:  Normocephalic, atraumatic.  The superficial temporal arteries are without ropiness or tenderness. Cardiovascular: Regular rate and rhythm. Lungs: Clear to auscultation bilaterally. Neck: There are no carotid bruits noted bilaterally.  NEUROLOGICAL:    01/15/2024   10:00 AM  Montreal Cognitive Assessment   Visuospatial/ Executive (0/5) 2  Naming (0/3) 3  Attention: Read list of digits (0/2) 1  Attention: Read list of letters (0/1) 1  Attention: Serial 7 subtraction starting at 100 (0/3) 2  Language: Repeat phrase (0/2) 1  Language : Fluency (0/1) 0  Abstraction (0/2) 2  Delayed Recall (0/5) 0  Orientation (0/6) 5  Total 17  Adjusted Score (based on education) 18       04/25/2021    3:13 PM  MMSE - Mini Mental State Exam  Orientation to time 5  Orientation to Place 5  Registration 3  Attention/ Calculation 5  Recall 3  Language- repeat 1     Orientation:  Alert and oriented to person, place and not to time. No aphasia or dysarthria. Fund of knowledge is appropriate. Recent and remote memory impaired.   Attention and concentration are reduced .  Able to name objects and unable to repeat phrases . 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 Extraocular muscles are intact and visual fields are full to confrontational testing. Speech is fluent and clear.  She giggles quite frequently during the interview.  No tongue deviation. Hearing is intact to conversational tone.    Tone: Tone is good throughout. Sensation: Sensation is intact to light touch.  Vibration is intact at the bilateral big toe.  Coordination: The patient has no difficulty with RAM's or FNF bilaterally. Normal finger to nose  Motor: Strength is 5/5 in the bilateral upper and lower extremities. There is no pronator drift. There are no fasciculations noted. DTR's: Deep tendon reflexes are 2/4 bilaterally. Gait and Station: The patient is able to ambulate without  difficulty. Gait is cautious and narrow. Stride length is normal         Thank you for allowing us  the opportunity to participate in the care of this nice patient. Please do not hesitate to contact us  for any questions or concerns.   Total time spent on today's visit was 45 minutes dedicated to this patient today, preparing to see patient, examining the patient, ordering tests and/or medications and counseling the patient, documenting clinical information in the EHR or other health record, independently interpreting results and communicating results to the patient/family, discussing treatment and goals, answering patient's questions and coordinating care.  Cc:  Donnie Galea, MD  Tex Filbert 01/15/2024 10:56 AM

## 2024-01-15 NOTE — Patient Instructions (Signed)
 It was a pleasure to see you today at our office.   Recommendations:  Neurocognitive evaluation at our office   Start Memantine 5 mg: Take 1 tablet (5 mg at night) for 2 weeks, then increase to 1 tablet (5 mg) twice a day   MRI of the brain, the radiology office will call you to arrange you appointment   Follow up in 2 months Recommend visiting the website : " Dementia Success [facility]" to better understand some behaviors related to memory loss.    For psychiatric meds, mood meds: Please have your primary care physician manage these medications.  If you have any severe symptoms of a stroke, or other severe issues such as confusion,severe chills or fever, etc call 911 or go to the ER as you may need to be evaluated further   For guidance regarding [facility] and if placement were needed at the facility, contact Social Worker tel: [phone]  For assessment of decision of mental capacity and competency:  Call Dr. Erick Blinks, geriatric psychiatrist at [phone]  Counseling regarding caregiver distress, including caregiver depression, anxiety and issues regarding community resources, adult day care programs, adult living facilities, or memory care questions:  please contact your  Primary Doctor's Social Worker   Whom to call: Memory  decline, memory medications: Call our office [phone]    [email or web address]   RECOMMENDATIONS FOR ALL PATIENTS WITH MEMORY PROBLEMS: 1. Continue to exercise (Recommend 30 minutes of walking everyday, or 3 hours every week) 2. Increase social interactions - continue going to [place] and enjoy social gatherings with friends and family 3. Eat healthy, avoid fried foods and eat more fruits and vegetables 4. Maintain adequate blood pressure, blood sugar, and blood cholesterol level. Reducing the risk of stroke and cardiovascular disease also helps promoting better memory. 5. Avoid  stressful situations. Live a simple life and avoid aggravations. Organize your time and prepare for the next day in anticipation. 6. Sleep well, avoid any interruptions of sleep and avoid any distractions in the bedroom that may interfere with adequate sleep quality 7. Avoid sugar, avoid sweets as there is a strong link between excessive sugar intake, diabetes, and cognitive impairment We discussed the Mediterranean diet, which has been shown to help patients reduce the risk of progressive memory disorders and reduces cardiovascular risk. This includes eating fish, eat fruits and green leafy vegetables, nuts like almonds and hazelnuts, walnuts, and also use olive oil. Avoid fast foods and fried foods as much as possible. Avoid sweets and sugar as sugar use has been linked to worsening of memory function.  There is always a concern of gradual progression of memory problems. If this is the case, then we may need to adjust level of care according to patient needs. Support, both to the patient and caregiver, should then be put into place.      You have been referred for a neuropsychological evaluation (i.e., evaluation of memory and thinking abilities). Please bring someone with you to this appointment if possible, as it is helpful for the doctor to hear from both you and another adult who knows you well. Please bring eyeglasses and hearing aids if you wear them.    The evaluation will take approximately 3 hours and has two parts:   The first part is a clinical interview with the neuropsychologist (Dr. Milbert Coulter or Dr. Robbie Lis). During the interview, the neuropsychologist will speak with you and the individual you brought to the appointment.    The second part  of the evaluation is testing with the doctor's technician Annabelle Harman or Selena Batten). During the testing, the technician will ask you to remember different types of material, solve problems, and answer some questionnaires. Your family member will not be present for  this portion of the evaluation.   Please note: We must reserve several hours of the neuropsychologist's time and the psychometrician's time for your evaluation appointment. As such, there is a No-Show fee of $100. If you are unable to attend any of your appointments, please contact our office as soon as possible to reschedule.      DRIVING: Regarding driving, in patients with progressive memory problems, driving will be impaired. We advise to have someone else do the driving if trouble finding directions or if minor accidents are reported. Independent driving assessment is available to determine safety of driving.   If you are interested in the driving assessment, you can contact the following:  [facility] in [place] [phone]  [facility] [phone]  [facility] [phone]  [facility] [phone] or [phone]   FALL PRECAUTIONS: Be cautious when walking. Scan the area for obstacles that may increase the risk of trips and falls. When getting up in the mornings, sit up at the edge of the bed for a few minutes before getting out of bed. Consider elevating the bed at the head end to avoid drop of blood pressure when getting up. Walk always in a well-lit room (use night lights in the walls). Avoid area rugs or power cords from appliances in the middle of the walkways. Use a walker or a cane if necessary and consider physical therapy for balance exercise. Get your eyesight checked regularly.  FINANCIAL OVERSIGHT: Supervision, especially oversight when making financial decisions or transactions is also recommended.  HOME SAFETY: Consider the safety of the kitchen when operating appliances like stoves, microwave oven, and blender. Consider having supervision and share cooking responsibilities until no longer able to participate in those. Accidents with firearms and other hazards in the house should be identified and addressed as  well.   ABILITY TO BE LEFT ALONE: If patient is unable to contact 911 operator, consider using LifeLine, or when the need is there, arrange for someone to stay with patients. Smoking is a fire hazard, consider supervision or cessation. Risk of wandering should be assessed by caregiver and if detected at any point, supervision and safe proof recommendations should be instituted.  MEDICATION SUPERVISION: Inability to self-administer medication needs to be constantly addressed. Implement a mechanism to ensure safe administration of the medications.      Mediterranean Diet A Mediterranean diet refers to food and lifestyle choices that are based on the traditions of countries located on the [place]. This way of eating has been shown to help prevent certain conditions and improve outcomes for people who have chronic diseases, like kidney disease and heart disease. What are tips for following this plan? Lifestyle  Cook and eat meals together with your family, when possible. Drink enough fluid to keep your urine clear or pale yellow. Be physically active every day. This includes: Aerobic exercise like running or swimming. Leisure activities like gardening, walking, or housework. Get 7-8 hours of sleep each night. If recommended by your health care provider, drink red wine in moderation. This means 1 glass a day for nonpregnant women and 2 glasses a day for men. A glass of wine equals 5 oz (150 mL). Reading food labels  Check the serving size of packaged foods.  For foods such as rice and pasta, the serving size refers to the amount of cooked product, not dry. Check the total fat in packaged foods. Avoid foods that have saturated fat or trans fats. Check the ingredients list for added sugars, such as corn syrup. Shopping  At the grocery store, buy most of your food from the areas near the walls of the store. This includes: Fresh fruits and vegetables (produce). Grains, beans, nuts, and  seeds. Some of these may be available in unpackaged forms or large amounts (in bulk). Fresh seafood. Poultry and eggs. Low-fat dairy products. Buy whole ingredients instead of prepackaged foods. Buy fresh fruits and vegetables in-season from local farmers markets. Buy frozen fruits and vegetables in resealable bags. If you do not have access to quality fresh seafood, buy precooked frozen shrimp or canned fish, such as tuna, salmon, or sardines. Buy small amounts of raw or cooked vegetables, salads, or olives from the deli or salad bar at your store. Stock your pantry so you always have certain foods on hand, such as olive oil, canned tuna, canned tomatoes, rice, pasta, and beans. Cooking  Cook foods with extra-virgin olive oil instead of using butter or other vegetable oils. Have meat as a side dish, and have vegetables or grains as your main dish. This means having meat in small portions or adding small amounts of meat to foods like pasta or stew. Use beans or vegetables instead of meat in common dishes like chili or lasagna. Experiment with different cooking methods. Try roasting or broiling vegetables instead of steaming or sauteing them. Add frozen vegetables to soups, stews, pasta, or rice. Add nuts or seeds for added healthy fat at each meal. You can add these to yogurt, salads, or vegetable dishes. Marinate fish or vegetables using olive oil, lemon juice, garlic, and fresh herbs. Meal planning  Plan to eat 1 vegetarian meal one day each week. Try to work up to 2 vegetarian meals, if possible. Eat seafood 2 or more times a week. Have healthy snacks readily available, such as: Vegetable sticks with hummus. Greek yogurt. Fruit and nut trail mix. Eat balanced meals throughout the week. This includes: Fruit: 2-3 servings a day Vegetables: 4-5 servings a day Low-fat dairy: 2 servings a day Fish, poultry, or lean meat: 1 serving a day Beans and legumes: 2 or more servings a week Nuts  and seeds: 1-2 servings a day Whole grains: 6-8 servings a day Extra-virgin olive oil: 3-4 servings a day Limit red meat and sweets to only a few servings a month What are my food choices? Mediterranean diet Recommended Grains: Whole-grain pasta. Brown rice. Bulgar wheat. Polenta. Couscous. Whole-wheat bread. Orpah Cobb. Vegetables: Artichokes. Beets. Broccoli. Cabbage. Carrots. Eggplant. Green beans. Chard. Kale. Spinach. Onions. Leeks. Peas. Squash. Tomatoes. Peppers. Radishes. Fruits: Apples. Apricots. Avocado. Berries. Bananas. Cherries. Dates. Figs. Grapes. Lemons. Melon. Oranges. Peaches. Plums. Pomegranate. Meats and other protein foods: Beans. Almonds. Sunflower seeds. Pine nuts. Peanuts. Cod. Salmon. Scallops. Shrimp. Tuna. Tilapia. Clams. Oysters. Eggs. Dairy: Low-fat milk. Cheese. Greek yogurt. Beverages: Water. Red wine. Herbal tea. Fats and oils: Extra virgin olive oil. Avocado oil. Grape seed oil. Sweets and desserts: [place] yogurt with honey. Baked apples. Poached pears. Trail mix. Seasoning and other foods: Basil. Cilantro. Coriander. Cumin. Mint. Parsley. Sage. Rosemary. Tarragon. Garlic. Oregano. Thyme. Pepper. Balsalmic vinegar. Tahini. Hummus. Tomato sauce. Olives. Mushrooms. Limit these Grains: Prepackaged pasta or rice dishes. Prepackaged cereal with added sugar. Vegetables: Deep fried potatoes (french fries). Fruits: Fruit canned  in syrup. Meats and other protein foods: Beef. Pork. Lamb. Poultry with skin. Hot dogs. Tomasa Blase. Dairy: Ice cream. Sour cream. Whole milk. Beverages: Juice. Sugar-sweetened soft drinks. Beer. Liquor and spirits. Fats and oils: Butter. Canola oil. Vegetable oil. Beef fat (tallow). Lard. Sweets and desserts: Cookies. Cakes. Pies. Candy. Seasoning and other foods: Mayonnaise. Premade sauces and marinades. The items listed may not be a complete list. Talk with your dietitian about what dietary choices are right for you. Summary The  Mediterranean diet includes both food and lifestyle choices. Eat a variety of fresh fruits and vegetables, beans, nuts, seeds, and whole grains. Limit the amount of red meat and sweets that you eat. Talk with your health care provider about whether it is safe for you to drink red wine in moderation. This means 1 glass a day for nonpregnant women and 2 glasses a day for men. A glass of wine equals 5 oz (150 mL). This information is not intended to replace advice given to you by your health care provider. Make sure you discuss any questions you have with your health care provider. Document Released: 05/15/2016 Document Revised: 06/17/2016 Document Reviewed: 05/15/2016 Elsevier Interactive Patient Education  2017 [employer].

## 2024-01-18 ENCOUNTER — Other Ambulatory Visit: Payer: Self-pay

## 2024-01-18 DIAGNOSIS — R413 Other amnesia: Secondary | ICD-10-CM

## 2024-01-19 ENCOUNTER — Encounter: Payer: Self-pay | Admitting: Cardiovascular Disease

## 2024-02-13 ENCOUNTER — Other Ambulatory Visit: Payer: Self-pay | Admitting: Physician Assistant

## 2024-02-16 ENCOUNTER — Ambulatory Visit (HOSPITAL_COMMUNITY)
Admission: RE | Admit: 2024-02-16 | Discharge: 2024-02-16 | Disposition: A | Discharge status: Home / Self Care | Source: Ambulatory Visit | Attending: Physician Assistant | Admitting: Physician Assistant

## 2024-02-16 DIAGNOSIS — R413 Other amnesia: Secondary | ICD-10-CM | POA: Insufficient documentation

## 2024-02-16 NOTE — CV Procedure (Signed)
  Device system confirmed to be MRI conditional, with implant date > 6 weeks ago, and no evidence of abandoned or epicardial leads in review of most recent CXR  Device last cleared by EP Provider: Creighton Doffing 02/10/24  Clearance is good through for 1 year as long as parameters remain stable at time of check. If pt undergoes a cardiac device procedure during that time, they should be re-cleared.   Tachy-therapies to be programmed off if applicable with device back to pre-MRI settings after completion of exam.  [employer] will be present for programming for the MRI.   Evert Hodgkins, RT  02/16/2024 12:26 PM

## 2024-02-17 ENCOUNTER — Ambulatory Visit: Payer: Self-pay | Admitting: Physician Assistant

## 2024-02-26 ENCOUNTER — Ambulatory Visit: Admitting: Psychology

## 2024-02-26 ENCOUNTER — Ambulatory Visit: Payer: Self-pay | Admitting: Psychology

## 2024-02-26 DIAGNOSIS — R413 Other amnesia: Secondary | ICD-10-CM

## 2024-02-26 DIAGNOSIS — F03A3 Unspecified dementia, mild, with mood disturbance: Secondary | ICD-10-CM

## 2024-02-26 DIAGNOSIS — R4189 Other symptoms and signs involving cognitive functions and awareness: Secondary | ICD-10-CM

## 2024-02-26 NOTE — Progress Notes (Signed)
   Psychometrician Note   Cognitive testing was administered to Whitney Manning by Arnulfo Larch, B.S. (psychometrist) under the supervision of Dr. Janice Meeter, Psy.D., licensed psychologist on 02/26/2024. Whitney Manning did not appear overtly distressed by the testing session per behavioral observation or responses across self-report questionnaires. Rest breaks were offered.    The battery of tests administered was selected by Dr. Janice Meeter, Psy.D. with consideration to Whitney Manning current level of functioning, the nature of her symptoms, emotional and behavioral responses during interview, level of literacy, observed level of motivation/effort, and the nature of the referral question. This battery was communicated to the psychometrist. Communication between Dr. Janice Meeter, Psy.D. and the psychometrist was ongoing throughout the evaluation and Dr. Janice Meeter, Psy.D. was immediately accessible at all times. Dr. Janice Meeter, Psy.D. provided supervision to the psychometrist on the date of this service to the extent necessary to assure the quality of all services provided.    Whitney Manning will return within approximately 1-2 weeks for an interactive feedback session with Dr. Donavon Fudge at which time her test performances, clinical impressions, and treatment recommendations will be reviewed in detail. Whitney Manning understands she can contact our office should she require our assistance before this time.  A total of 80 minutes of billable time were spent face-to-face with Whitney Manning by the psychometrist. This includes both test administration and scoring time. Billing for these services is reflected in the clinical report generated by Dr. Janice Meeter, Psy.D.  This note reflects time spent with the psychometrician and does not include test scores or any clinical interpretations made by Dr. Donavon Fudge. The full report will follow in a separate note.

## 2024-02-26 NOTE — Progress Notes (Unsigned)
 NEUROPSYCHOLOGICAL EVALUATION [place]  [place] Department of Neurology  Date of Evaluation: 02/26/2024  REASON FOR REFERRAL   [place] Welcome is an 89 year old, right-handed, White female with 12 years of formal education. She was referred for neuropsychological evaluation by Tex Filbert, PA-C, to assess current neurocognitive functioning, document potential cognitive deficits, and assist with treatment planning. This is her first neuropsychological evaluation.  SUMMARY OF RESULTS   Note: While she is believed to have put forth good effort on today's assessment, the testing battery was abbreviated due to limited endurance.  Premorbid cognitive abilities are estimated to be in the average range based on word reading and sociodemographic factors. Given this estimate, performance today was variable in the domains of language, visuospatial functioning, and learning/memory.   Specifically, both semantic and phonemic fluency were poor. Confrontation naming was inconsistent, performing weaker on a shorter task but within normal limits on a more extensive one. Performance on a visuoperceptual task was low, while visuoconstruction tasks were intact. Regarding verbal memory, she had difficulty encoding, recalling, and recognizing details from a short story. Her immediate recall of a word list was also limited, and she could only recall a single detail after a delay. However, her ability to recognize the information was relatively stronger. Regarding visual memory, she was unable to recall any details after a delay despite initially producing a perfect copy of the complex figure; she benefited mildly from recognition cues.  Remaining measures were within age-related expectations, including attention, processing speed, and judgment. On self-report questionnaires, she did not endorse clinically-elevated levels of depression, anxiety, or daytime sleepiness.  DIAGNOSTIC IMPRESSION    Results of the current evaluation indicated variability in language, visuospatial functioning, and learning/memory. 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 The overall clinical picture is concerning for a vascular etiology given the patient's history of multiple chronic vascular risk factors, along with cerebrovascular pathology observed on neuroimaging. While the cognitive profile does not fully align with classic Alzheimer's disease--given relatively preserved visuospatial skills, adequate confrontation naming, and some benefit from recognition cueing--the degree of anosognosia and the pervasiveness of learning/memory impairment warrant ongoing monitoring for a potential mixed vascular and neurodegenerative etiology. Impaired learning may partly explain the difficulties with both recall and recognition; however, her performance on the complex figure reflected forgetting despite intact initial encoding. Additional neurological workup (e.g., blood tests, CSF analysis, amyloid imaging) may help further clarify the underlying etiology of the observed deficits.  ICD-10 Codes: F03.A3 Mild dementia; R41.3 Memory deficit  RECOMMENDATIONS   In consultation with your doctor, schedule cognitive reevaluation on an as-needed basis to assess for cognitive decline and update treatment recommendations. Reevaluation should occur during a period of medical and affective stability.  Discuss the risks and benefits of continuing memantine  with your neurologist, as it has been prescribed but you do not appear to be taking it at this time.  Given your functional decline, increasing need for assistance, and the fact that your husband is elderly and experiencing health issues, along with prior medical recommendations for a change in living situation, it may no longer be safe for you both to live alone. I strongly encourage you to consider alternative  living arrangements--such as the apartment built by your son and daughter-in-law--where support is more readily available.  Continue to have a trusted family member assist with management of finances and medical appointments. Although no issues have been reported, it may be helpful for a  trusted family member to periodically review medications to ensure accuracy and prevent potential errors.  Given that family has observed persistent changes in mood--such as ongoing depression and anxiety--it may be helpful to discuss treatment options, including the possibility of starting a medication, with your primary care provider. At the very least, mood should be monitored to ensure it is not interfering with daily functioning, and if it is, treatment is strongly considered.  Manage vascular risk factors through a heart healthy diet (e.g., MIND, Mediterranean), physician-approved physical activity, and medication adherence.   Patient will likely benefit from structure and routine. She is also encouraged to engage in activities which keep her physically, socially, and cognitively active to promote healthy aging.  Additional resources  The [employer] Division of Aging works to promote the independence and enhance the dignity of [place] 's older adults, persons with disabilities and their families, through a community-based system of opportunities, services, benefits and protections: GeneBlogs.si  The Alzheimer's Association provides assistance and resources for individuals with a wide range of cognitive and functional impairments: LimitLaws.hu  Consider implementing compensatory strategies to maximize independence and maintain daily functioning. Examples include:   Adhere to routine. Compensatory strategies work best when they are used consistently. Use a planner, calendar, or white board that has the schedule and important events for the day clearly listed to reference and  cross off when tasks are complete.   Ask for written information, especially if it is new or unfamiliar (e.g., information provided at a doctor's appointment).   Create an organized environment. Keep items that can be easily misplaced in a sensible location and get into the habit of always returning the items to those places.   Pay attention and reduce distractions. Make a point of focusing attention on information you want to remember. One-on-one interaction is more likely to facilitate attention and minimize distraction. Make eye contact and repeat the information out loud after you hear it. Reduce interruptions or distractions especially when attempting to learn new information.   Create associations. When learning something new, think about and understand the information. Explain it in your own words or try to associate it with something you already know. Take notes to help remember important details.  Evaluate goals and plan accordingly. When confronted by many different tasks, begin by making a list that prioritizes each task and estimates the time it will take to complete. Break down complicated tasks into smaller, more manageable steps.   Focus on one task at a time and complete each task before starting another. Avoid multitasking.  DISPOSITION   Patient will follow up with the referring provider, [name]. No follow-up neuropsychological testing was scheduled at this time. Please feel free to refer the patient for repeated evaluation if she shows a significant change in neurocognitive status. She and her daughter-in-law will be provided verbal feedback in approximately one week regarding the findings and impression during this visit.  The remainder of the report includes the details of the patient's background and a table of results from the current evaluation, which support the summary and recommendations described above.  BACKGROUND   History of Presenting Illness: The following  information was obtained from a review of medical records and an interview with the patient and her daughter-in-law, Jerlene Moody. Patient established care with neurology on 01/15/2024 due to family concerns of memory difficulties over the last year. MoCA = 18/30. She was referred for neuropsychological evaluation accordingly.  Cognitive Functioning: During today's appointment, the patient denied experiencing any cognitive  difficulties. In contrast, her daughter-in-law reported noticeable cognitive changes over the past year. 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 Patient once enjoyed gardening, walking, and similar activities, but no longer does so, which her family sees as a meaningful change. According to the daughter-in-law, cognitive changes span multiple domains, including short-term memory, attention, processing speed, word/name finding, and executive functioning (e.g.,  planning, organizing, problem-solving). Regarding memory, she repeats questions, sometimes four to five times within a single conversation. While not severe or often, there have been episodes of disorientation, such as becoming confused in a familiar grocery store. Daughter-in-law mentioned that the patient does not leave the house frequently enough to know whether navigation difficulties would be more pronounced in unfamiliar settings.  Physical Functioning: Patient denied difficulties with sleep initiation and maintenance. Appetite is reduced. She reported a loss of both sense of smell and taste years ago. She has hearing loss in her left ear and wears bilateral hearing aids. Vision is stable. She occasionally experiences dizziness upon standing. Her family has also observed that she appears to be moving more slowly. However, she denied issues with balance or falls. She also denied experiencing any  tremor.  Emotional Functioning: Patient was unable to clearly articulate her mood, stating only, "I just do what I have to do." In contrast, her daughter-in-law described her as being persistently anxious and depressed. While the patient has expressed passive thoughts--such as wishing she would not wake up in the morning--she denied any active suicidal ideation. Notably, her daughter-in-law reported that, in anticipation of today's appointment, the patient called her approximately 50 times out of worry, which represents a significant change from her baseline behavior.  Imaging: MRI of the brain (02/16/2024) documented mild bilateral periventricular and subcortical white matter hyperintensities, most likely secondary to chronic microvascular ischemia; old microhemorrhages in the left frontal lobe and bilateral temporal lobes; and mild diffuse cerebral volume loss.  Other Relevant Medical History: Remarkable for hypertension, hyperlipidemia, atrial fibrillation, coronary artery disease, aortic stenosis, s/p pacemaker placement, s/p aortic valve replacement, osteoarthritis, osteopenia, and h/o cancer (i.e., lung, parotid gland, salivary gland). No history of stroke, CNS infection, head injury, or seizure was reported.  Current Medications: Per record, acetaminophen , aspirin , atorvastatin , coenzyme Q10, ezetimibe , vitamin B-12, and vitamin D3. Daughter-in-law noted that the patient is not taking memantine  at this time, despite it having been prescribed.  Functional Status: Patient independently performs all basic activities of daily living. Her husband manages the finances; this is longstanding. She independently manages her medications, although it is currently unclear whether she takes them reliably. She no longer drives. While the patient reports being able to cook, her daughter-in-law states that she does not actually cook. Daughter-in-law strongly believes that without assistance, the patient would be  unable to manage instrumental activities of daily living independently.  Family Neurological History: Remarkable for dementia (mother, brother)  Psychiatric History: History of depression, anxiety, prior mental health treatment, suicidal ideation, hallucinations, and psychiatric hospitalizations was not reported.  Substance Use History: Patient reported infrequent alcohol consumption. Current use of nicotine, marijuana, and illicit substances was denied.  Social and Developmental History: Patient was born in [place], [place]. History of perinatal complications and developmental delays was not reported. She is married to her second husband, having been widowed previously, and has an adult son. She currently lives with her husband; however, both their doctors have advised that they should not  be living together without additional support. Although the son has built an apartment on his property for them to move into, the husband has refused this option, so it is not being utilized at this time.  Educational and Occupational History: No history of childhood learning disability, special education services, or grade retention was reported. 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 She completed high school on time. 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  BEHAVIORAL OBSERVATIONS   Patient arrived on time and was accompanied by her daughter-in-law, Jerlene Moody. She ambulated independently and without gait disturbance. She was alert and oriented with the exception of the exact date. She was appropriately groomed and dressed for the setting. No significant sensory or motor abnormalities were observed. Vision and hearing (with hearing aids) were adequate for testing purposes. Speech was of normal rate, prosody, and volume. No conversational word-finding difficulties, paraphasic  errors, or dysarthria were observed. Comprehension was conversationally intact. Thought processes were linear, logical, and coherent. Thought content was organized and devoid of delusions. Insight appeared poor. Affect was congruent with anxious mood, and she expressed reluctance about being here. Consequently, the test battery was abbreviated. Nevertheless, she remained cooperative and gave adequate effort during testing, including on an embedded measure of performance validity. Results are thought to accurately reflect her cognitive functioning at this time.  NEUROPSYCHOLOGICAL TESTING RESULTS   Tests Administered: Animal Naming Test; Clock Drawing; Controlled Oral Word Association Test (COWAT): FAS; Epworth Sleepiness Scale (ESS); Geriatric Anxiety Scale-10 Item (GAS-10); Geriatric Depression Scale Short Form (GDS-SF); Neuropsychological Assessment Battery (NAB) Form 1 - Subtest(s): Naming, Judgement; Repeatable Battery for the Assessment of Neuropsychological Status Update (RBANS Update) - Form A; Test of Premorbid Functioning (TOPF); Trail Making Test (TMT); and Wechsler Adult Intelligence Scale Fifth Edition (WAIS-5) - Subtest(s): Clinical cytogeneticist.  Test results are provided in the table below. Whenever possible, the patient's scores were compared against age-, sex-, and education-corrected normative samples. Interpretive descriptions are based on the AACN consensus conference statement on uniform labeling (Guilmette et al., 2020).  COGNITIVE SCREENING RAW  RANGE  RBANS Total Score -- StdS=60 Exceptionally Low  RBANS Immediate Memory -- StdS=44 Exceptionally Low  RBANS Visuospatial/Constructional -- StdS=100 Average  RBANS Language -- StdS=57 Exceptionally Low  RBANS Attention -- StdS=88 Low Average  RBANS Delayed Memory -- StdS=52 Exceptionally Low  PREMORBID FUNCTIONING RAW  RANGE  TOPF 26 StdS=91 Average  ATTENTION & PROCESSING SPEED RAW  RANGE  RBANS Attention -- StdS=88 Low Average  Digit  Span -- ss=9 Average  Coding -- ss=7 Low Average  Trails A 33''0e T=58 High Average  EXECUTIVE FUNCTION RAW  RANGE  Trails B 214''3e T=42 Low Average  COWAT Letter Fluency 1+1+6 T=19 Exceptionally Low  NAB Judgement -- T=67 Above Average  LANGUAGE RAW  RANGE  RBANS Language -- StdS=57 Exceptionally Low  Picture Naming 7/10 <2%ile Exceptionally Low  Semantic Fluency 5 ss=2 Exceptionally Low  COWAT Letter Fluency 1+1+6 T=19 Exceptionally Low  Animal Naming Test 11 T=35 Below Average  NAB Naming Test 25/31  +2/6 with PC T=39 WNL  VISUOSPATIAL RAW    RBANS Visuospatial/Constructional -- StdS=100 Average  Figure Copy 20/20 ss=14 High Average  Line Orientation -- 3-9%ile Below Average to Low Average  WAIS-5 Block Design -- ss=13 High Average  Clock Drawing 7/10 -- WNL  VERBAL LEARNING & MEMORY RAW  RANGE  RBANS Word List -- -- --  List Learning (1+3+4+3)/40 ss=2 Exceptionally Low  List Recall 1/10 10-16%ile Low Average  List Recognition 16/20 3-9%ile Below Average to Low Average  RBANS Story -- -- --  Story Memory (0+1)/24 ss=1 Exceptionally Low  Story Recall 0/12 ss=1 Exceptionally Low  Story Recognition 4/12 <1%ile Exceptionally Low  VISUAL LEARNING & MEMORY RAW  RANGE  RBANS Figure -- -- --  Figure Copy 20/20 ss=14 High Average  Figure Recall 0/20 ss=1 Exceptionally Low  Figure Recognition 4/8 21-38%ile Low Average to Average  QUESTIONNAIRES RAW  RANGE  GDS-SF 1 -- Minimal  GAS-10 0 -- Minimal  ESS 0 -- WNL  *Note: ss = scaled score; StdS = standard score; T = t-score; C/S = corrected raw score; WNL = within normal limits; BNL= below normal limits; D/C = discontinued. Scores from skewed distributions are typically interpreted as WNL (>=16th %ile) or BNL (<16th %ile).   INFORMED CONSENT   Patient was provided with a verbal description of the nature and purpose of the neuropsychological evaluation. Also reviewed were the foreseeable risks and/or discomforts and benefits of the  procedure, limits of confidentiality, and mandatory reporting requirements of this provider. Patient was given the opportunity to have their questions answered. Oral consent to participate was provided by the patient.   This report was prepared as part of a clinical evaluation and is not intended for forensic use.  SERVICE   This evaluation was conducted by Janice Meeter, Psy.D. In addition to time spent directly with the patient, total professional time (120 minutes) includes record review, integration of relevant medical history, test selection, interpretation of findings, and report preparation. A technician, Arnulfo Larch, B.S., provided testing and scoring assistance for 80 minutes.  Psychiatric Diagnostic Evaluation Services (Professional): 86578 x 1 Neuropsychological Testing Evaluation Services (Professional): 46962 x 1 Neuropsychological Testing Evaluation Services (Professional): 95284 x 1 Neuropsychological Test Administration and Scoring Radiographer, therapeutic): [phone] x 1 Neuropsychological Test Administration and Scoring (Technician): [phone] x 2  This report was generated using voice recognition software. While this document has been carefully reviewed, transcription errors may be present. I apologize in advance for any inconvenience. Please contact me if further clarification is needed.            Janice Meeter, Psy.D.             Neuropsychologist

## 2024-03-01 NOTE — Progress Notes (Signed)
 Remote pacemaker transmission.

## 2024-03-01 NOTE — Addendum Note (Signed)
 Addended by: Lott Rouleau A on: 03/01/2024 10:31 AM   Modules accepted: Orders

## 2024-03-07 ENCOUNTER — Ambulatory Visit: Admitting: Psychology

## 2024-03-07 ENCOUNTER — Ambulatory Visit: Payer: Medicare Other | Admitting: Cardiovascular Disease

## 2024-03-07 DIAGNOSIS — F03A3 Unspecified dementia, mild, with mood disturbance: Secondary | ICD-10-CM | POA: Diagnosis not present

## 2024-03-07 NOTE — Progress Notes (Signed)
   NEUROPSYCHOLOGY FEEDBACK SESSION Whitney Manning. [facility]  [place] Department of Neurology  Date of Feedback Session: 03/07/2024  REASON FOR REFERRAL   Whitney Manning is an 89 year old, right-handed, White female with 12 years of formal education. She was referred for neuropsychological evaluation by Whitney Filbert, PA-C, to assess current neurocognitive functioning, document potential cognitive deficits, and assist with treatment planning. This is her first neuropsychological evaluation.  FEEDBACK   Patient completed a comprehensive neuropsychological evaluation on 02/26/2024. Please refer to that encounter for the full report and recommendations. Briefly, results indicated variability in language, visuospatial functioning, and learning/memory. 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 The overall clinical picture is concerning for a vascular etiology given the patient's history of multiple chronic vascular risk factors, along with cerebrovascular pathology observed on neuroimaging. While the cognitive profile does not fully align with classic Alzheimer's disease--given relatively preserved visuospatial skills, adequate confrontation naming, and some benefit from recognition cueing--the degree of anosognosia and the pervasiveness of learning/memory impairment warrant ongoing monitoring for a potential mixed vascular and neurodegenerative etiology. Impaired learning may partly explain the difficulties with both recall and recognition; however, her performance on the complex figure reflected forgetting despite intact initial encoding. Additional neurological workup (e.g., blood tests, CSF analysis, amyloid imaging) may help further clarify the underlying etiology of the observed deficits.   Today, the patient was accompanied by her daughter-in-law. She was provided verbal feedback regarding the findings and impression during  this visit, and her questions were answered. A copy of the report was provided at the conclusion of the visit.  DISPOSITION   Patient will follow up with the referring provider, Whitney Manning. No follow-up neuropsychological testing was scheduled at this time. Please feel free to refer the patient for repeated evaluation if she shows a significant change in neurocognitive status.  SERVICE   This feedback session was conducted by Whitney Manning, Psy.D. One unit of 95621 (35 minutes) was billed for Dr. Donavon Manning' time spent in preparing, conducting, and documenting the current feedback session.  This report was generated using voice recognition software. While this document has been carefully reviewed, transcription errors may be present. I apologize in advance for any inconvenience. Please contact me if further clarification is needed.

## 2024-03-08 ENCOUNTER — Encounter: Payer: Self-pay | Admitting: Family Medicine

## 2024-03-08 ENCOUNTER — Encounter: Payer: Self-pay | Admitting: Physician Assistant

## 2024-03-08 ENCOUNTER — Telehealth: Payer: Self-pay | Admitting: Family Medicine

## 2024-03-08 ENCOUNTER — Ambulatory Visit: Admitting: Physician Assistant

## 2024-03-08 ENCOUNTER — Ambulatory Visit: Admitting: Family Medicine

## 2024-03-08 VITALS — BP 147/78 | HR 86 | Resp 18 | Ht 64.0 in | Wt 111.0 lb

## 2024-03-08 VITALS — BP 106/62 | HR 70 | Temp 98.4°F | Ht 64.0 in | Wt 110.8 lb

## 2024-03-08 DIAGNOSIS — R55 Syncope and collapse: Secondary | ICD-10-CM | POA: Diagnosis not present

## 2024-03-08 DIAGNOSIS — R413 Other amnesia: Secondary | ICD-10-CM | POA: Diagnosis not present

## 2024-03-08 DIAGNOSIS — F03A3 Unspecified dementia, mild, with mood disturbance: Secondary | ICD-10-CM | POA: Diagnosis not present

## 2024-03-08 DIAGNOSIS — R634 Abnormal weight loss: Secondary | ICD-10-CM

## 2024-03-08 LAB — CBC WITH DIFFERENTIAL/PLATELET
Basophils Absolute: 0 10*3/uL (ref 0.0–0.1)
Basophils Relative: 0.5 % (ref 0.0–3.0)
Eosinophils Absolute: 0.1 10*3/uL (ref 0.0–0.7)
Eosinophils Relative: 1.4 % (ref 0.0–5.0)
HCT: 41.8 % (ref 36.0–46.0)
Hemoglobin: 14.2 g/dL (ref 12.0–15.0)
Lymphocytes Relative: 14 % (ref 12.0–46.0)
Lymphs Abs: 0.9 10*3/uL (ref 0.7–4.0)
MCHC: 33.9 g/dL (ref 30.0–36.0)
MCV: 90.8 fl (ref 78.0–100.0)
Monocytes Absolute: 0.5 10*3/uL (ref 0.1–1.0)
Monocytes Relative: 7.4 % (ref 3.0–12.0)
Neutro Abs: 5.2 10*3/uL (ref 1.4–7.7)
Neutrophils Relative %: 76.7 % (ref 43.0–77.0)
Platelets: 198 10*3/uL (ref 150.0–400.0)
RBC: 4.6 Mil/uL (ref 3.87–5.11)
RDW: 13.5 % (ref 11.5–15.5)
WBC: 6.8 10*3/uL (ref 4.0–10.5)

## 2024-03-08 LAB — COMPREHENSIVE METABOLIC PANEL WITH GFR
ALT: 21 U/L (ref 0–35)
AST: 26 U/L (ref 0–37)
Albumin: 4.5 g/dL (ref 3.5–5.2)
Alkaline Phosphatase: 75 U/L (ref 39–117)
BUN: 19 mg/dL (ref 6–23)
CO2: 28 meq/L (ref 19–32)
Calcium: 9.6 mg/dL (ref 8.4–10.5)
Chloride: 103 meq/L (ref 96–112)
Creatinine, Ser: 1 mg/dL (ref 0.40–1.20)
GFR: 50.99 mL/min — ABNORMAL LOW (ref >60.00)
Glucose, Bld: 87 mg/dL (ref 70–99)
Potassium: 4.3 meq/L (ref 3.5–5.1)
Sodium: 139 meq/L (ref 135–145)
Total Bilirubin: 1.1 mg/dL (ref 0.2–1.2)
Total Protein: 6.9 g/dL (ref 6.0–8.3)

## 2024-03-08 LAB — TSH: TSH: 1.26 u[IU]/mL (ref 0.35–5.50)

## 2024-03-08 LAB — VITAMIN B12: Vitamin B-12: 1057 pg/mL — ABNORMAL HIGH (ref 211–911)

## 2024-03-08 NOTE — Patient Instructions (Signed)
 It was a pleasure to see you today at our office.   Recommendations:  Neurocognitive evaluation at our office  Continue Memantine  5 mg as directed, goal will be to 10 mg twice a day if no issues with insomnia    For psychiatric meds, mood meds: Please have your primary care physician manage these medications.  If you have any severe symptoms of a stroke, or other severe issues such as confusion,severe chills or fever, etc call 911 or go to the ER as you may need to be evaluated further   For guidance regarding [facility] and if placement were needed at the facility, contact Social Worker tel: [phone]  For assessment of decision of mental capacity and competency:  Call Dr. Laverne Potter, geriatric psychiatrist at [phone]  Counseling regarding caregiver distress, including caregiver depression, anxiety and issues regarding community resources, adult day care programs, adult living facilities, or memory care questions:  please contact your  Primary Doctor's Social Worker   Whom to call: Memory  decline, memory medications: Call our office [phone]    [email or web address]   RECOMMENDATIONS FOR ALL PATIENTS WITH MEMORY PROBLEMS: 1. Continue to exercise (Recommend 30 minutes of walking everyday, or 3 hours every week) 2. Increase social interactions - continue going to [place] and enjoy social gatherings with friends and family 3. Eat healthy, avoid fried foods and eat more fruits and vegetables 4. Maintain adequate blood pressure, blood sugar, and blood cholesterol level. Reducing the risk of stroke and cardiovascular disease also helps promoting better memory. 5. Avoid stressful situations. Live a simple life and avoid aggravations. Organize your time and prepare for the next day in anticipation. 6. Sleep well, avoid any interruptions of sleep and avoid any distractions in the bedroom that may interfere  with adequate sleep quality 7. Avoid sugar, avoid sweets as there is a strong link between excessive sugar intake, diabetes, and cognitive impairment We discussed the Mediterranean diet, which has been shown to help patients reduce the risk of progressive memory disorders and reduces cardiovascular risk. This includes eating fish, eat fruits and green leafy vegetables, nuts like almonds and hazelnuts, walnuts, and also use olive oil. Avoid fast foods and fried foods as much as possible. Avoid sweets and sugar as sugar use has been linked to worsening of memory function.  There is always a concern of gradual progression of memory problems. If this is the case, then we may need to adjust level of care according to patient needs. Support, both to the patient and caregiver, should then be put into place.      DRIVING: Regarding driving, in patients with progressive memory problems, driving will be impaired. We advise to have someone else do the driving if trouble finding directions or if minor accidents are reported. Independent driving assessment is available to determine safety of driving.   If you are interested in the driving assessment, you can contact the following:  [facility] [employer] [facility] in [place] [phone]  [facility] [phone]  [facility] [phone]  [facility] [phone] or [phone]   FALL PRECAUTIONS: Be cautious when walking. Scan the area for obstacles that may increase the risk of trips and falls. When getting up in the mornings, sit up at the edge of the bed for a few minutes before getting out of bed. Consider elevating the bed at the head end to avoid drop of blood pressure when getting up. Walk always in a well-lit room (use night lights in the  walls). Avoid area rugs or power cords from appliances in the middle of the walkways. Use a walker or a cane if necessary and consider physical therapy for balance exercise. Get  your eyesight checked regularly.  FINANCIAL OVERSIGHT: Supervision, especially oversight when making financial decisions or transactions is also recommended.  HOME SAFETY: Consider the safety of the kitchen when operating appliances like stoves, microwave oven, and blender. Consider having supervision and share cooking responsibilities until no longer able to participate in those. Accidents with firearms and other hazards in the house should be identified and addressed as well.   ABILITY TO BE LEFT ALONE: If patient is unable to contact 911 operator, consider using LifeLine, or when the need is there, arrange for someone to stay with patients. Smoking is a fire hazard, consider supervision or cessation. Risk of wandering should be assessed by caregiver and if detected at any point, supervision and safe proof recommendations should be instituted.  MEDICATION SUPERVISION: Inability to self-administer medication needs to be constantly addressed. Implement a mechanism to ensure safe administration of the medications.      Mediterranean Diet A Mediterranean diet refers to food and lifestyle choices that are based on the traditions of countries located on the [employer] [place]. This way of eating has been shown to help prevent certain conditions and improve outcomes for people who have chronic diseases, like kidney disease and heart disease. What are tips for following this plan? Lifestyle  Cook and eat meals together with your family, when possible. Drink enough fluid to keep your urine clear or pale yellow. Be physically active every day. This includes: Aerobic exercise like running or swimming. Leisure activities like gardening, walking, or housework. Get 7-8 hours of sleep each night. If recommended by your health care provider, drink red wine in moderation. This means 1 glass a day for nonpregnant women and 2 glasses a day for men. A glass of wine equals 5 oz (150 mL). Reading food labels   Check the serving size of packaged foods. For foods such as rice and pasta, the serving size refers to the amount of cooked product, not dry. Check the total fat in packaged foods. Avoid foods that have saturated fat or trans fats. Check the ingredients list for added sugars, such as corn syrup. Shopping  At the grocery store, buy most of your food from the areas near the walls of the store. This includes: Fresh fruits and vegetables (produce). Grains, beans, nuts, and seeds. Some of these may be available in unpackaged forms or large amounts (in bulk). Fresh seafood. Poultry and eggs. Low-fat dairy products. Buy whole ingredients instead of prepackaged foods. Buy fresh fruits and vegetables in-season from local farmers markets. Buy frozen fruits and vegetables in resealable bags. If you do not have access to quality fresh seafood, buy precooked frozen shrimp or canned fish, such as tuna, salmon, or sardines. Buy small amounts of raw or cooked vegetables, salads, or olives from the deli or salad bar at your store. Stock your pantry so you always have certain foods on hand, such as olive oil, canned tuna, canned tomatoes, rice, pasta, and beans. Cooking  Cook foods with extra-virgin olive oil instead of using butter or other vegetable oils. Have meat as a side dish, and have vegetables or grains as your main dish. This means having meat in small portions or adding small amounts of meat to foods like pasta or stew. Use beans or vegetables instead of meat in common dishes like chili or  lasagna. Experiment with different cooking methods. Try roasting or broiling vegetables instead of steaming or sauteing them. Add frozen vegetables to soups, stews, pasta, or rice. Add nuts or seeds for added healthy fat at each meal. You can add these to yogurt, salads, or vegetable dishes. Marinate fish or vegetables using olive oil, lemon juice, garlic, and fresh herbs. Meal planning  Plan to eat 1  vegetarian meal one day each week. Try to work up to 2 vegetarian meals, if possible. Eat seafood 2 or more times a week. Have healthy snacks readily available, such as: Vegetable sticks with hummus. Greek yogurt. Fruit and nut trail mix. Eat balanced meals throughout the week. This includes: Fruit: 2-3 servings a day Vegetables: 4-5 servings a day Low-fat dairy: 2 servings a day Fish, poultry, or lean meat: 1 serving a day Beans and legumes: 2 or more servings a week Nuts and seeds: 1-2 servings a day Whole grains: 6-8 servings a day Extra-virgin olive oil: 3-4 servings a day Limit red meat and sweets to only a few servings a month What are my food choices? Mediterranean diet Recommended Grains: Whole-grain pasta. Brown rice. Bulgar wheat. Polenta. Couscous. Whole-wheat bread. Dwyane Glad. Vegetables: Artichokes. Beets. Broccoli. Cabbage. Carrots. Eggplant. Green beans. Chard. Kale. Spinach. Onions. Leeks. Peas. Squash. Tomatoes. Peppers. Radishes. Fruits: Apples. Apricots. Avocado. Berries. Bananas. Cherries. Dates. Figs. Grapes. Lemons. Melon. Oranges. Peaches. Plums. Pomegranate. Meats and other protein foods: Beans. Almonds. Sunflower seeds. Pine nuts. Peanuts. Cod. Salmon. Scallops. Shrimp. Tuna. Tilapia. Clams. Oysters. Eggs. Dairy: Low-fat milk. Cheese. Greek yogurt. Beverages: Water. Red wine. Herbal tea. Fats and oils: Extra virgin olive oil. Avocado oil. Grape seed oil. Sweets and desserts: [place] yogurt with honey. Baked apples. Poached pears. Trail mix. Seasoning and other foods: Basil. Cilantro. Coriander. Cumin. Mint. Parsley. Sage. Rosemary. Tarragon. Garlic. Oregano. Thyme. Pepper. Balsalmic vinegar. Tahini. Hummus. Tomato sauce. Olives. Mushrooms. Limit these Grains: Prepackaged pasta or rice dishes. Prepackaged cereal with added sugar. Vegetables: Deep fried potatoes (french fries). Fruits: Fruit canned in syrup. Meats and other protein foods: Beef. Pork. Lamb.  Poultry with skin. Hot dogs. Helene Loader. Dairy: Ice cream. Sour cream. Whole milk. Beverages: Juice. Sugar-sweetened soft drinks. Beer. Liquor and spirits. Fats and oils: Butter. Canola oil. Vegetable oil. Beef fat (tallow). Lard. Sweets and desserts: Cookies. Cakes. Pies. Candy. Seasoning and other foods: Mayonnaise. Premade sauces and marinades. The items listed may not be a complete list. Talk with your dietitian about what dietary choices are right for you. Summary The Mediterranean diet includes both food and lifestyle choices. Eat a variety of fresh fruits and vegetables, beans, nuts, seeds, and whole grains. Limit the amount of red meat and sweets that you eat. Talk with your health care provider about whether it is safe for you to drink red wine in moderation. This means 1 glass a day for nonpregnant women and 2 glasses a day for men. A glass of wine equals 5 oz (150 mL). This information is not intended to replace advice given to you by your health care provider. Make sure you discuss any questions you have with your health care provider. Document Released: 05/15/2016 Document Revised: 06/17/2016 Document Reviewed: 05/15/2016 Elsevier Interactive Patient Education  2017 [employer].

## 2024-03-08 NOTE — Progress Notes (Unsigned)
 Down about 9 lbs in the last ~10 months.  Unclear source.  No fevers.  Other than her memory loss and the event described below, no other new recent events that would clearly trigger her weight loss.  She is going to follow up with neuro today about namenda , with it possibly contributing to insomnia.  I'll defer memory testing today given eval and follow up.    She is on no BP meds.    Recent fall d/w pt.  She was getting up and going to the window, had her hands full, then she was on the floor. She was able to get up.  No known SZ.  Husband was in the house but not in the room.  She was back to baseline after she got up.  No known head trauma.  Lower BP noted.  Scraped L elbow, that is scabbed and healing.  She didn't recall getting lightheaded.    Had lower BP on home check in the meantime.  She has pacer in place.    Meds, vitals, and allergies reviewed.   ROS: Per HPI unless specifically indicated in ROS section   GEN: nad, alert and pleasant in conversation, speech at baseline. HEENT: mucous membranes moist NECK: supple w/o LA CV: rrr. PULM: ctab, no inc wob ABD: soft, +bs EXT: no edema SKIN: no acute rash, healing scab on the left elbow

## 2024-03-08 NOTE — Telephone Encounter (Signed)
 This patient fell at home on 03/03/24.  Event was in the morning, likely between 9 and 10 AM.  Can you please check her pacer to make sure she didn't have a contributing abnormal cardiac rhythm?  Thanks.

## 2024-03-08 NOTE — Progress Notes (Signed)
 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 RH female with a history of hypertension, hyperlipidemia, bradycardia, history of head and neck cancer in the past, status post neck resection in the left, with subsequent HOH seen today in follow up to discuss results of MRI brain (02/17/24)  and recent neuropsych evaluation (02/2024) . MRI of the brain personally reviewed remarkable for mild diffuse cerebral volume loss with mild chronic microvascular ischemic changes, old microhemorrhages in the left frontal lobe and bilateral temporal lobes." The overall clinical picture is concerning for a vascular etiology given the patient's history of multiple chronic vascular risk factors, along with cerebrovascular pathology observed on neuroimaging. While the cognitive profile does not fully align with classic Alzheimer's disease-- ". Patient is currently on memantine  5 mg twice daily, discussed increasing it to 10 mg twice daily for better coverage, she agrees to proceed.      Follow up in  6 months. Continue memantine , increase to 10 mg twice daily, side effects discussed  Continue to replenish B12 (288) Recommend psychotherapy for situational anxiety and depression Recommend checking the hearing to help with comprehension Blood biomarkers clarify underlying etiology of memory loss Recommend good control of her cardiovascular risk factors Continue to control mood as per PCP     Neuropsych evaluation 02/26/2024 briefly, results indicated variability in language, visuospatial functioning, and learning/memory. 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 The overall clinical picture is concerning for a vascular etiology given the patient's history of multiple chronic vascular risk factors, along with cerebrovascular pathology observed on neuroimaging. While the cognitive profile  does not fully align with classic Alzheimer's disease--given relatively preserved visuospatial skills, adequate confrontation naming, and some benefit from recognition cueing--the degree of anosognosia and the pervasiveness of learning/memory impairment warrant ongoing monitoring for a potential mixed vascular and neurodegenerative etiology. Impaired learning may partly explain the difficulties with both recall and recognition; however, her performance on the complex figure reflected forgetting despite intact initial encoding. Additional neurological workup (e.g., blood tests, CSF analysis, amyloid imaging) may help further clarify the underlying etiology of the observed deficits.    MRI of the brain on 02/17/2024, personally reviewed remarkable for mild diffuse cerebral volume loss with mild chronic microvascular ischemic changes, old microhemorrhages in the left frontal lobe and bilateral temporal lobes   Initial visit 01/15/2024 How long did patient have memory difficulties?  For about 1 year.  Patient reports some difficulty remembering new information, recent conversations, names. She no longer does calligraphy, read, crossword puzzles and not interested. repeats oneself?  Endorsed, especially over stories Disoriented when walking into a room?  Patient denies   Leaving objects in unusual places?  Per daughter reports that she lost the phone and found it behind the dishes (but she was hiding it).  Wandering behavior? denies   Any personality changes, or depression, anxiety?  Husband is ill, which brought significant amount of stress and depression, agitation, irritability to her."I don't want to do anything". She does not get help of her children and it brings more stress to her.  Hallucinations or paranoia? Denies hallucinations. Paranoid about her phone, she is afraid of anyone, of strangers so she hides the phone so she can use it "just in case ".    Seizures? denies    Any sleep changes?  Does  not sleep well, has been caring for her  husband.  Denies vivid dreams, REM behavior or sleepwalking   Sleep apnea? Denies.   Any hygiene concerns?  Denies.   Independent of bathing and dressing? Endorsed  Does the patient need help with medications? Patient is in charge   Who is in charge of the finances?  Her husband is in charge     Any changes in appetite?  Eats because she has to. If her husband does not  want to, she does not eat. Patient have trouble swallowing?  Denies.   Does the patient cook? I used to cook, but I don't like eating as much. Any headaches?  Denies.   Chronic pain? Denies.   Ambulates with difficulty? Denies    Recent falls or head injuries? Denies.     Vision changes?  Denies any new issues.  Any strokelike symptoms? Denies.   Any tremors? Denies.   Any anosmia? Endorsed, since XRT to the neck.  Any incontinence of urine? Denies.   Any bowel dysfunction? Denies.      Patient lives with husband   History of heavy alcohol intake? Denies.   History of heavy tobacco use? Denies.   Family history of dementia?  Mother and her older brother had dementia ?type   Does patient drive? No longer drives, due to increased stress and sleep     PREVIOUS MEDICATIONS:   CURRENT MEDICATIONS:  Outpatient Encounter Medications as of 03/08/2024  Medication Sig   acetaminophen  (TYLENOL ) 325 MG tablet Take 2 tablets (650 mg total) by mouth every 4 (four) hours as needed for headache or mild pain.   aspirin  EC 81 MG tablet Take 1 tablet (81 mg total) by mouth daily. Swallow whole.   atorvastatin  (LIPITOR) 40 MG tablet TAKE 1 TABLET BY MOUTH EVERY DAY   Cholecalciferol (VITAMIN D ) 2000 UNITS CAPS Take 1 capsule (2,000 Units total) by mouth daily.   Coenzyme Q10 (COQ10 PO) Take 1 capsule by mouth daily.   cyanocobalamin  (VITAMIN B12) 1000 MCG tablet Take 1 tablet (1,000 mcg total) by mouth daily.   ezetimibe  (ZETIA ) 10 MG tablet Take 1 tablet (10 mg total) by mouth daily.    memantine  (NAMENDA ) 5 MG tablet TAKE 1 TABLET (5 MG AT NIGHT) FOR 2 WEEKS, THEN INCREASE TO 1 TABLET (5 MG) TWICE A DAY   No facility-administered encounter medications on file as of 03/08/2024.       04/25/2021    3:13 PM  MMSE - Mini Mental State Exam  Orientation to time 5  Orientation to Place 5  Registration 3  Attention/ Calculation 5  Recall 3  Language- repeat 1      01/15/2024   10:00 AM  Montreal Cognitive Assessment   Visuospatial/ Executive (0/5) 2  Naming (0/3) 3  Attention: Read list of digits (0/2) 1  Attention: Read list of letters (0/1) 1  Attention: Serial 7 subtraction starting at 100 (0/3) 2  Language: Repeat phrase (0/2) 1  Language : Fluency (0/1) 0  Abstraction (0/2) 2  Delayed Recall (0/5) 0  Orientation (0/6) 5  Total 17  Adjusted Score (based on education) 18     VITALS:   Vitals:   03/08/24 1453  BP: (!) 147/78  Pulse: 86  Resp: 18  SpO2: 97%  Weight: 111 lb (50.3 kg)  Height: 5\' 4"  (1.626 m)      Thank you for allowing us  the opportunity to participate in the care of this nice patient. Please do not hesitate to contact us  for any  questions or concerns.   Total time spent on today's visit was 38 minutes dedicated to this patient today, preparing to see patient, examining the patient, ordering tests and/or medications and counseling the patient, documenting clinical information in the EHR or other health record, independently interpreting results and communicating results to the patient/family, discussing treatment and goals, answering patient's questions and coordinating care.  Cc:  Donnie Galea, MD  Tex Filbert 03/08/2024 3:57 PM

## 2024-03-08 NOTE — Patient Instructions (Addendum)
 Go to the lab on the way out.   If you have mychart we'll likely use that to update you.    Take care.  Glad to see you. Try to increase water intake daily.  Add a little salt to food daily.   Update me as needed.   Call [phone] about an alert button.

## 2024-03-09 ENCOUNTER — Ambulatory Visit: Payer: Self-pay | Admitting: Family Medicine

## 2024-03-09 DIAGNOSIS — R634 Abnormal weight loss: Secondary | ICD-10-CM | POA: Insufficient documentation

## 2024-03-09 NOTE — Assessment & Plan Note (Signed)
 Of unclear source.  See notes on follow-up labs.

## 2024-03-09 NOTE — Telephone Encounter (Signed)
 I appreciate the help of all involved.  Cardiology has contacted patient/family.

## 2024-03-09 NOTE — Assessment & Plan Note (Signed)
 Of unclear source.  I am asking for cardiology input to interrogate her pacer.  At this point still okay for outpatient follow-up.  Discussed getting a fall alert button.  Unclear if her symptoms were related to relative hypotension potentially exacerbated by relative decrease in fluid intake and weight loss.  I asked her to try to increase water intake daily.  She can add a little salt to food daily.  See notes on labs.

## 2024-03-09 NOTE — Assessment & Plan Note (Signed)
 With neurology follow-up pending.

## 2024-03-10 NOTE — Telephone Encounter (Addendum)
 Remote transmission received and reviewed.   Presenting rhythm, NSR. Note made on graph and sudden AV conduction has returned mid [facility] June. Patient was previously RV paced >99%. Note current report has R wave measuring 7.9 mV (RV sensitivity is set at 4.0 ms). PVC burden is <1%.   Spoke to [employer] Rep who recommends several adjustments to be made to the device.   Program RV sensitivity from 4.0 ms to 2.0 ms.  Program VIP ON w/ extension @ 150 ms, 3 min, x 3 cycles. Decrease High Ventricular Rate to 150 bpm, 10 cycles. Program SAV to 200 ms  Program PAV to 200 ms Turn OFF Rate Response PVARP/V Ref.  Offered device clinic apt today., however patient is unable to come this week d/t lack of transportation.   Device Clinic apt made Tuesday 03/15/24 @ 10:00. Location, date and time discussed w/ verbal understanding.   Advised ED precautions over the weekend into Monday. Voiced understanding.

## 2024-03-15 ENCOUNTER — Ambulatory Visit: Attending: Cardiology

## 2024-03-15 DIAGNOSIS — R001 Bradycardia, unspecified: Secondary | ICD-10-CM

## 2024-03-15 NOTE — Progress Notes (Signed)
 Changes made to session per EPIC note on 03/10/2024.  1. Programed RV sensitivity from 4.0 ms to 2.0 ms.  2. Program VIP ON w/ extension @ 150 ms, 3 min, x 3 cycles. 3. Program SAV to 200 ms 4.  Program PAV to 200 ms 5. Turn OFF Rate Response PVARP/V Ref.

## 2024-04-12 ENCOUNTER — Ambulatory Visit: Payer: Medicare Other

## 2024-04-12 DIAGNOSIS — I442 Atrioventricular block, complete: Secondary | ICD-10-CM

## 2024-04-14 LAB — CUP PACEART REMOTE DEVICE CHECK
Battery Remaining Longevity: 119 mo
Battery Remaining Percentage: 94 %
Battery Voltage: 3.01 V
Brady Statistic AP VP Percent: 4 %
Brady Statistic AP VS Percent: 15 %
Brady Statistic AS VP Percent: 28 %
Brady Statistic AS VS Percent: 53 %
Brady Statistic RA Percent Paced: 19 %
Brady Statistic RV Percent Paced: 32 %
Date Time Interrogation Session: 20250708020018
Implantable Lead Connection Status: 753985
Implantable Lead Connection Status: 753985
Implantable Lead Implant Date: 20240708
Implantable Lead Implant Date: 20240708
Implantable Lead Location: 753859
Implantable Lead Location: 753860
Implantable Pulse Generator Implant Date: 20240708
Lead Channel Impedance Value: 510 Ohm
Lead Channel Impedance Value: 560 Ohm
Lead Channel Pacing Threshold Amplitude: 0.75 V
Lead Channel Pacing Threshold Amplitude: 0.875 V
Lead Channel Pacing Threshold Pulse Width: 0.5 ms
Lead Channel Pacing Threshold Pulse Width: 0.5 ms
Lead Channel Sensing Intrinsic Amplitude: 12 mV
Lead Channel Sensing Intrinsic Amplitude: 3 mV
Lead Channel Setting Pacing Amplitude: 1 V
Lead Channel Setting Pacing Amplitude: 1.875
Lead Channel Setting Pacing Pulse Width: 0.5 ms
Lead Channel Setting Sensing Sensitivity: 2 mV
Pulse Gen Model: 2272
Pulse Gen Serial Number: 5827861

## 2024-04-18 ENCOUNTER — Ambulatory Visit: Payer: Self-pay | Admitting: Cardiovascular Disease

## 2024-05-18 ENCOUNTER — Other Ambulatory Visit: Payer: Self-pay | Admitting: Cardiovascular Disease

## 2024-05-18 DIAGNOSIS — Z79899 Other long term (current) drug therapy: Secondary | ICD-10-CM

## 2024-05-18 DIAGNOSIS — E782 Mixed hyperlipidemia: Secondary | ICD-10-CM

## 2024-05-18 DIAGNOSIS — I2510 Atherosclerotic heart disease of native coronary artery without angina pectoris: Secondary | ICD-10-CM

## 2024-07-12 ENCOUNTER — Ambulatory Visit: Payer: Medicare Other

## 2024-07-12 DIAGNOSIS — I441 Atrioventricular block, second degree: Secondary | ICD-10-CM | POA: Diagnosis not present

## 2024-07-13 ENCOUNTER — Encounter

## 2024-07-13 LAB — CUP PACEART REMOTE DEVICE CHECK
Battery Remaining Longevity: 116 mo
Battery Remaining Percentage: 92 %
Battery Voltage: 3.01 V
Brady Statistic AP VP Percent: 5.8 %
Brady Statistic AP VS Percent: 13 %
Brady Statistic AS VP Percent: 38 %
Brady Statistic AS VS Percent: 43 %
Brady Statistic RA Percent Paced: 19 %
Brady Statistic RV Percent Paced: 44 %
Date Time Interrogation Session: 20251007020015
Implantable Lead Connection Status: 753985
Implantable Lead Connection Status: 753985
Implantable Lead Implant Date: 20240708
Implantable Lead Implant Date: 20240708
Implantable Lead Location: 753859
Implantable Lead Location: 753860
Implantable Pulse Generator Implant Date: 20240708
Lead Channel Impedance Value: 510 Ohm
Lead Channel Impedance Value: 600 Ohm
Lead Channel Pacing Threshold Amplitude: 0.75 V
Lead Channel Pacing Threshold Amplitude: 0.75 V
Lead Channel Pacing Threshold Pulse Width: 0.5 ms
Lead Channel Pacing Threshold Pulse Width: 0.5 ms
Lead Channel Sensing Intrinsic Amplitude: 11.1 mV
Lead Channel Sensing Intrinsic Amplitude: 3.7 mV
Lead Channel Setting Pacing Amplitude: 1 V
Lead Channel Setting Pacing Amplitude: 1.75 V
Lead Channel Setting Pacing Pulse Width: 0.5 ms
Lead Channel Setting Sensing Sensitivity: 2 mV
Pulse Gen Model: 2272
Pulse Gen Serial Number: 5827861

## 2024-07-15 NOTE — Progress Notes (Signed)
 Remote PPM Transmission

## 2024-07-18 ENCOUNTER — Ambulatory Visit: Payer: Self-pay | Admitting: Cardiovascular Disease

## 2024-08-18 NOTE — Progress Notes (Unsigned)
  Electrophysiology Office Note:    Date:  08/19/2024   ID:  Whitney Manning, DOB 04/28/1935, MRN [id number]  PCP:  Cleatus Arlyss RAMAN, MD   [facility] Providers Cardiologist:  Powell FORBES Sorrow, MD (Inactive) Electrophysiologist:  Eulas FORBES Furbish, MD     Referring MD: Cleatus Arlyss RAMAN, MD   History of Present Illness:    Whitney Manning is a 89 y.o. female with a medical history significant for second-degree heart block status post placement of an Abbott dual-chamber pacemaker, history of aortic valve repair who presents for device follow-up.     She was admitted in July 2024 with symptomatic second-degree heart block and underwent placement of an Abbott dual-chamber pacemaker with a left bundle branch lead.  Echo showed slightly reduced EF at 45 to 50%.  she has no device related complaints -- no new tenderness, drainage, redness.      Today, she is doing very well and has no complaints.  EKGs/Labs/Other Studies Reviewed Today:     Echocardiogram:  TTE April 11, 2023 EF 4550%, mild RV dysfunction, mild biatrial enlargement.  Severe mitral valve calcification without stenosis     EKG:   EKG Interpretation Date/Time:  Friday August 19 2024 10:14:41 EST Ventricular Rate:  84 PR Interval:  234 QRS Duration:  142 QT Interval:  426 QTC Calculation: 503 R Axis:   101  Text Interpretation: Atrial-sensed ventricular-paced rhythm with prolonged AV conduction When compared with ECG of 18-Aug-2023 10:05, Vent. rate has increased BY   6 BPM Confirmed by Furbish Eulas [id number] on 08/19/2024 10:16:47 AM     Physical Exam:    VS:  There were no vitals taken for this visit.    Wt Readings from Last 3 Encounters:  03/08/24 111 lb (50.3 kg)  03/08/24 110 lb 12.8 oz (50.3 kg)  11/30/23 114 lb (51.7 kg)     GEN: Well nourished, well developed in no acute distress CARDIAC: RRR The device site is normal -- no tenderness, edema, drainage, redness, threatened  erosion.  RESPIRATORY:  Normal work of breathing MUSCULOSKELETAL: no edema    ASSESSMENT & PLAN:     Second-degree AV block Abbott dual-chamber pacemaker in place, normal function I reviewed the device interrogation today.  See Paceart for details She is not device dependent today Continue remote monitoring checks  History of aortic valve repair She will continue to follow in general cardiology clinic  Mildly reduced EF No signs/symptoms of CHF      Signed, Eulas FORBES Furbish, MD  08/19/2024 10:16 AM    [facility]

## 2024-08-19 ENCOUNTER — Encounter: Payer: Self-pay | Admitting: Cardiovascular Disease

## 2024-08-19 ENCOUNTER — Ambulatory Visit: Attending: Cardiovascular Disease | Admitting: Cardiovascular Disease

## 2024-08-19 VITALS — BP 118/70 | HR 84 | Ht 64.0 in | Wt 111.1 lb

## 2024-08-19 DIAGNOSIS — I442 Atrioventricular block, complete: Secondary | ICD-10-CM

## 2024-08-19 DIAGNOSIS — R001 Bradycardia, unspecified: Secondary | ICD-10-CM

## 2024-08-19 LAB — CUP PACEART INCLINIC DEVICE CHECK
Battery Remaining Longevity: 116 mo
Battery Voltage: 2.99 V
Brady Statistic RA Percent Paced: 19 %
Brady Statistic RV Percent Paced: 57 %
Date Time Interrogation Session: 20251114104734
Implantable Lead Connection Status: 753985
Implantable Lead Connection Status: 753985
Implantable Lead Implant Date: 20240708
Implantable Lead Implant Date: 20240708
Implantable Lead Location: 753859
Implantable Lead Location: 753860
Implantable Pulse Generator Implant Date: 20240708
Lead Channel Impedance Value: 512.5 Ohm
Lead Channel Impedance Value: 575 Ohm
Lead Channel Pacing Threshold Amplitude: 0.75 V
Lead Channel Pacing Threshold Amplitude: 0.75 V
Lead Channel Pacing Threshold Amplitude: 0.75 V
Lead Channel Pacing Threshold Amplitude: 0.75 V
Lead Channel Pacing Threshold Pulse Width: 0.5 ms
Lead Channel Pacing Threshold Pulse Width: 0.5 ms
Lead Channel Pacing Threshold Pulse Width: 0.5 ms
Lead Channel Pacing Threshold Pulse Width: 0.5 ms
Lead Channel Sensing Intrinsic Amplitude: 12 mV
Lead Channel Sensing Intrinsic Amplitude: 2.7 mV
Lead Channel Setting Pacing Amplitude: 1 V
Lead Channel Setting Pacing Amplitude: 1.75 V
Lead Channel Setting Pacing Pulse Width: 0.5 ms
Lead Channel Setting Sensing Sensitivity: 2 mV
Pulse Gen Model: 2272
Pulse Gen Serial Number: 5827861

## 2024-08-19 NOTE — Patient Instructions (Signed)

## 2024-09-05 ENCOUNTER — Ambulatory Visit: Payer: Self-pay | Admitting: Cardiovascular Disease

## 2024-09-05 ENCOUNTER — Other Ambulatory Visit: Payer: Self-pay | Admitting: Cardiovascular Disease

## 2024-09-05 DIAGNOSIS — I2510 Atherosclerotic heart disease of native coronary artery without angina pectoris: Secondary | ICD-10-CM

## 2024-09-05 DIAGNOSIS — I350 Nonrheumatic aortic (valve) stenosis: Secondary | ICD-10-CM

## 2024-09-05 DIAGNOSIS — E782 Mixed hyperlipidemia: Secondary | ICD-10-CM

## 2024-09-05 DIAGNOSIS — Z953 Presence of xenogenic heart valve: Secondary | ICD-10-CM

## 2024-09-06 ENCOUNTER — Ambulatory Visit

## 2024-09-07 ENCOUNTER — Telehealth: Payer: Self-pay | Admitting: Student in an Organized Health Care Education/Training Program

## 2024-09-07 DIAGNOSIS — I350 Nonrheumatic aortic (valve) stenosis: Secondary | ICD-10-CM

## 2024-09-07 DIAGNOSIS — Z79899 Other long term (current) drug therapy: Secondary | ICD-10-CM

## 2024-09-07 DIAGNOSIS — Z953 Presence of xenogenic heart valve: Secondary | ICD-10-CM

## 2024-09-07 DIAGNOSIS — E782 Mixed hyperlipidemia: Secondary | ICD-10-CM

## 2024-09-07 DIAGNOSIS — I2510 Atherosclerotic heart disease of native coronary artery without angina pectoris: Secondary | ICD-10-CM

## 2024-09-07 NOTE — Telephone Encounter (Unsigned)
 Copied from CRM #[id number]. Topic: Clinical - Medication Refill >> Sep 07, 2024 11:09 AM Montie POUR wrote: Medication:  ezetimibe  (ZETIA ) 10 MG tablet atorvastatin  (LIPITOR) 40 MG tablet  Has the patient contacted their pharmacy? Yes (Agent: If no, request that the patient contact the pharmacy for the refill. If patient does not wish to contact the pharmacy document the reason why and proceed with request.) (Agent: If yes, when and what did the pharmacy advise?) Pharmacy needs order to refill   This is the patient's preferred pharmacy:  [phone], [place] - [address] [place] [address] Phone: [phone] Fax: [phone]  Is this the correct pharmacy for this prescription? Yes If no, delete pharmacy and type the correct one.   Has the prescription been filled recently? No  Is the patient out of the medication? Yes - Out for a couple of days   Has the patient been seen for an appointment in the last year OR does the patient have an upcoming appointment? Yes  Can we respond through MyChart? No  Agent: Please be advised that Rx refills may take up to 3 business days. We ask that you follow-up with your pharmacy.

## 2024-09-13 ENCOUNTER — Encounter: Payer: Self-pay | Admitting: Physician Assistant

## 2024-09-13 ENCOUNTER — Ambulatory Visit: Admitting: Physician Assistant

## 2024-09-13 VITALS — BP 134/82 | HR 82 | Resp 20 | Ht 64.0 in | Wt 105.0 lb

## 2024-09-13 DIAGNOSIS — F03A3 Unspecified dementia, mild, with mood disturbance: Secondary | ICD-10-CM

## 2024-09-13 MED ORDER — MEMANTINE HCL 10 MG PO TABS: ORAL_TABLET | ORAL | 3 refills | Status: DC

## 2024-09-13 NOTE — Progress Notes (Signed)
 Dementia of unclear etiology, concern for vascular   Whitney Manning is a very pleasant 89 y.o. 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 Patient is currently on memantine  10 mg twice daily.  This patient is accompanied in the office by her husband and daughter who supplement  the history.  Previous records as well as any outside records available were reviewed prior to todays visit. Patient was last seen on 03/08/2024. Memory is stable, although she has not been compliant with her medications. MMSE today is  25/30. Patient is able to participate on ADLs and continues to drive without difficulties. Mood is good    Follow-up in 3 months at which time will add donepezil  if compliant with medicines Continue memantine  10 mg twice daily, side effects discussed Recommend psychotherapy for situational anxiety and depression Recommend checking hearing on a regular basis to help with comprehension, continue to use hearing aids Recommend good control of cardiovascular risk factors Continue to control mood medications by PCP  Discussed the use of AI scribe software for clinical note transcription with the patient, who gave verbal consent to proceed.  History of Present Illness  Whitney Manning is an 89 year old female with mild dementia and anxiety who presents for follow-up on memory loss. She is accompanied by her husband and daughter.  She had a neuropsychological evaluation in May, with concerns about possible vascular causes for her memory loss. She has been taking memantine  5 mg once daily instead of the prescribed 10 mg twice daily due to perceived side effects of insomnia. She repeats questions multiple times a day and  occasionally misplaces items, such as keys and her phone. She uses hearing aids but reports complete hearing loss in one ear.   Her history of anxiety and depression has been exacerbated recently. Her anxiety manifests as frequent phone calls in a row to her daughter, sometimes up to ten times a day, about the same concerns. Her family notes increased paranoia, including fears of being followed and hiding items in different parts of the house.She experiences racing thoughts at night, which may contribute to her sleep disturbances. No nightmares or vivid dreams.She lives with her husband. Her daughter is helping with the finances.  Her appetite is reduced, and she reports not tasting food well. She does not consume nutritional supplements like Ensure, although her husband does. She takes CoQ10 and vitamin B supplements.   She has a pacemaker due to previous bradycardia. No recent strokes, seizures, or new tremors There are no issues with incontinence or hygiene.     Neuropsych evaluation 02/26/2024 Dr. Gayland briefly, results indicated variability in language, visuospatial functioning, and learning/memory. 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 The overall clinical picture is concerning for a vascular etiology given the patient's history of multiple chronic vascular risk factors, along with cerebrovascular pathology observed on neuroimaging. While the cognitive profile does not fully align with classic Alzheimer's disease--given relatively preserved visuospatial skills, adequate confrontation naming, and some benefit from recognition cueing--the degree of anosognosia and the pervasiveness of learning/memory impairment warrant ongoing monitoring for a potential mixed vascular and neurodegenerative etiology. Impaired learning may partly explain the  difficulties with both recall and recognition; however, her performance on the complex  figure reflected forgetting despite intact initial encoding. Additional neurological workup (e.g., blood tests, CSF analysis, amyloid imaging) may help further clarify the underlying etiology of the observed deficits.      MRI of the brain on 02/17/2024, personally reviewed remarkable for mild diffuse cerebral volume loss with mild chronic microvascular ischemic changes, old microhemorrhages in the left frontal lobe and bilateral temporal lobes     Initial visit 01/15/2024 How long did patient have memory difficulties?  For about 1 year.  Patient reports some difficulty remembering new information, recent conversations, names. She no longer does calligraphy, read, crossword puzzles and not interested. repeats oneself?  Endorsed, especially over stories Disoriented when walking into a room?  Patient denies   Leaving objects in unusual places?  Per daughter reports that she lost the phone and found it behind the dishes (but she was hiding it).  Wandering behavior? denies   Any personality changes, or depression, anxiety?  Husband is ill, which brought significant amount of stress and depression, agitation, irritability to her.I don't want to do anything. She does not get help of her children and it brings more stress to her.  Hallucinations or paranoia? Denies hallucinations. Paranoid about her phone, she is afraid of anyone, of strangers so she hides the phone so she can use it just in case .    Seizures? denies    Any sleep changes?  Does not sleep well, has been caring for her husband.  Denies vivid dreams, REM behavior or sleepwalking   Sleep apnea? Denies.   Any hygiene concerns?  Denies.   Independent of bathing and dressing? Endorsed  Does the patient need help with medications? Patient is in charge   Who is in charge of the finances?  Her husband is in charge     Any changes in appetite?  Eats because she has to. If her husband does not  want to, she does not eat. Patient have trouble  swallowing?  Denies.   Does the patient cook? I used to cook, but I don't like eating as much. Any headaches?  Denies.   Chronic pain? Denies.   Ambulates with difficulty? Denies    Recent falls or head injuries? Denies.     Vision changes?  Denies any new issues.  Any strokelike symptoms? Denies.   Any tremors? Denies.   Any anosmia? Endorsed, since XRT to the neck.  Any incontinence of urine? Denies.   Any bowel dysfunction? Denies.      Patient lives with husband   History of heavy alcohol intake? Denies.   History of heavy tobacco use? Denies.   Family history of dementia?  Mother and her older brother had dementia ?type   Does patient drive? No longer drives, due to increased stress and sleep        09/13/2024    5:00 PM 04/25/2021    3:13 PM  MMSE - Mini Mental State Exam  Orientation to time 4 5  Orientation to Place 3 5  Registration 3 3  Attention/ Calculation 5 5  Recall 2 3  Language- name 2 objects 2   Language- repeat 1 1  Language- follow 3 step command 3   Language- read & follow direction 1   Write a sentence 1   Copy design 0   Total score 25       01/15/2024   10:00 AM  Montreal Cognitive Assessment  Visuospatial/ Executive (0/5) 2  Naming (0/3) 3  Attention: Read list of digits (0/2) 1  Attention: Read list of letters (0/1) 1  Attention: Serial 7 subtraction starting at 100 (0/3) 2  Language: Repeat phrase (0/2) 1  Language : Fluency (0/1) 0  Abstraction (0/2) 2  Delayed Recall (0/5) 0  Orientation (0/6) 5  Total 17  Adjusted Score (based on education) 18      Objective:    Neurological Exam:    VITALS:   Vitals:   09/13/24 1441  BP: 134/82  Pulse: 82  Resp: 20  SpO2: 98%  Weight: 105 lb (47.6 kg)  Height: 5' 4 (1.626 m)    GEN:  The patient appears stated age and is in NAD. HEENT:  Normocephalic, atraumatic.   Neurological examination:  General: NAD, well-groomed, appears stated age. Orientation: The patient is alert.  Oriented to person, place and not to date Cranial nerves: There is good facial symmetry. The speech is fluent and clear. No aphasia or dysarthria. Fund of knowledge is appropriate. Recent and remote memory are impaired. Attention and concentration are reduced. Able to name objects and repeat phrases.  Hearing is intact to conversational tone with hearing aid.   Sensation: Sensation is intact to light touch throughout Motor: Strength is at least antigravity x4. DTR's 2/4 in UE/LE     Movement examination:  Tone: There is normal tone in the UE/LE Abnormal movements:  no tremor.  No myoclonus.  No asterixis.   Coordination:  There is no decremation with RAM's. Normal finger to nose  Gait and Station: The patient has no  difficulty arising out of a deep-seated chair without the use of the hands. The patient's stride length is good.  Gait is cautious and narrow.    Thank you for allowing us  the opportunity to participate in the care of this nice patient. Please do not hesitate to contact us  for any questions or concerns.   Total time spent on today's visit was 36 minutes dedicated to this patient today, preparing to see patient, examining the patient, ordering tests and/or medications and counseling the patient, documenting clinical information in the EHR or other health record, independently interpreting results and communicating results to the patient/family, discussing treatment and goals, answering patient's questions and coordinating care.  Cc:  Cleatus Arlyss RAMAN, MD  Camie Sevin 09/13/2024 5:23 PM

## 2024-09-13 NOTE — Patient Instructions (Signed)
 It was a pleasure to see you today at our office.   Recommendations:  Continue Memantine  increase  10 mg twice a day   Follow up in 3 months    For psychiatric meds, mood meds: Please have your primary care physician manage these medications.  If you have any severe symptoms of a stroke, or other severe issues such as confusion,severe chills or fever, etc call 911 or go to the ER as you may need to be evaluated further   For guidance regarding [facility] and if placement were needed at the facility, contact Social Worker tel: [phone]  For assessment of decision of mental capacity and competency:  Call Dr. Rosaline Nine, geriatric psychiatrist at [phone]  Counseling regarding caregiver distress, including caregiver depression, anxiety and issues regarding community resources, adult day care programs, adult living facilities, or memory care questions:  please contact your  Primary Doctor's Social Worker   Whom to call: Memory  decline, memory medications: Call our office [phone]    [email or web address]   RECOMMENDATIONS FOR ALL PATIENTS WITH MEMORY PROBLEMS: 1. Continue to exercise (Recommend 30 minutes of walking everyday, or 3 hours every week) 2. Increase social interactions - continue going to [place] and enjoy social gatherings with friends and family 3. Eat healthy, avoid fried foods and eat more fruits and vegetables 4. Maintain adequate blood pressure, blood sugar, and blood cholesterol level. Reducing the risk of stroke and cardiovascular disease also helps promoting better memory. 5. Avoid stressful situations. Live a simple life and avoid aggravations. Organize your time and prepare for the next day in anticipation. 6. Sleep well, avoid any interruptions of sleep and avoid any distractions in the bedroom that may interfere with adequate sleep quality 7. Avoid sugar, avoid sweets as there is  a strong link between excessive sugar intake, diabetes, and cognitive impairment We discussed the Mediterranean diet, which has been shown to help patients reduce the risk of progressive memory disorders and reduces cardiovascular risk. This includes eating fish, eat fruits and green leafy vegetables, nuts like almonds and hazelnuts, walnuts, and also use olive oil. Avoid fast foods and fried foods as much as possible. Avoid sweets and sugar as sugar use has been linked to worsening of memory function.  There is always a concern of gradual progression of memory problems. If this is the case, then we may need to adjust level of care according to patient needs. Support, both to the patient and caregiver, should then be put into place.      DRIVING: Regarding driving, in patients with progressive memory problems, driving will be impaired. We advise to have someone else do the driving if trouble finding directions or if minor accidents are reported. Independent driving assessment is available to determine safety of driving.   If you are interested in the driving assessment, you can contact the following:  [facility] [employer] [facility] in [place] [phone]  [facility] [phone]  [facility] [phone]  [facility] [phone] or [phone]   FALL PRECAUTIONS: Be cautious when walking. Scan the area for obstacles that may increase the risk of trips and falls. When getting up in the mornings, sit up at the edge of the bed for a few minutes before getting out of bed. Consider elevating the bed at the head end to avoid drop of blood pressure when getting up. Walk always in a well-lit room (use night lights in the walls). Avoid area rugs or power cords from appliances in  the middle of the walkways. Use a walker or a cane if necessary and consider physical therapy for balance exercise. Get your eyesight checked regularly.  FINANCIAL OVERSIGHT: Supervision,  especially oversight when making financial decisions or transactions is also recommended.  HOME SAFETY: Consider the safety of the kitchen when operating appliances like stoves, microwave oven, and blender. Consider having supervision and share cooking responsibilities until no longer able to participate in those. Accidents with firearms and other hazards in the house should be identified and addressed as well.   ABILITY TO BE LEFT ALONE: If patient is unable to contact 911 operator, consider using LifeLine, or when the need is there, arrange for someone to stay with patients. Smoking is a fire hazard, consider supervision or cessation. Risk of wandering should be assessed by caregiver and if detected at any point, supervision and safe proof recommendations should be instituted.  MEDICATION SUPERVISION: Inability to self-administer medication needs to be constantly addressed. Implement a mechanism to ensure safe administration of the medications.      Mediterranean Diet A Mediterranean diet refers to food and lifestyle choices that are based on the traditions of countries located on the [employer] [place]. This way of eating has been shown to help prevent certain conditions and improve outcomes for people who have chronic diseases, like kidney disease and heart disease. What are tips for following this plan? Lifestyle  Cook and eat meals together with your family, when possible. Drink enough fluid to keep your urine clear or pale yellow. Be physically active every day. This includes: Aerobic exercise like running or swimming. Leisure activities like gardening, walking, or housework. Get 7-8 hours of sleep each night. If recommended by your health care provider, drink red wine in moderation. This means 1 glass a day for nonpregnant women and 2 glasses a day for men. A glass of wine equals 5 oz (150 mL). Reading food labels  Check the serving size of packaged foods. For foods such as rice and  pasta, the serving size refers to the amount of cooked product, not dry. Check the total fat in packaged foods. Avoid foods that have saturated fat or trans fats. Check the ingredients list for added sugars, such as corn syrup. Shopping  At the grocery store, buy most of your food from the areas near the walls of the store. This includes: Fresh fruits and vegetables (produce). Grains, beans, nuts, and seeds. Some of these may be available in unpackaged forms or large amounts (in bulk). Fresh seafood. Poultry and eggs. Low-fat dairy products. Buy whole ingredients instead of prepackaged foods. Buy fresh fruits and vegetables in-season from local farmers markets. Buy frozen fruits and vegetables in resealable bags. If you do not have access to quality fresh seafood, buy precooked frozen shrimp or canned fish, such as tuna, salmon, or sardines. Buy small amounts of raw or cooked vegetables, salads, or olives from the deli or salad bar at your store. Stock your pantry so you always have certain foods on hand, such as olive oil, canned tuna, canned tomatoes, rice, pasta, and beans. Cooking  Cook foods with extra-virgin olive oil instead of using butter or other vegetable oils. Have meat as a side dish, and have vegetables or grains as your main dish. This means having meat in small portions or adding small amounts of meat to foods like pasta or stew. Use beans or vegetables instead of meat in common dishes like chili or lasagna. Experiment with different cooking methods. Try roasting or broiling  vegetables instead of steaming or sauteing them. Add frozen vegetables to soups, stews, pasta, or rice. Add nuts or seeds for added healthy fat at each meal. You can add these to yogurt, salads, or vegetable dishes. Marinate fish or vegetables using olive oil, lemon juice, garlic, and fresh herbs. Meal planning  Plan to eat 1 vegetarian meal one day each week. Try to work up to 2 vegetarian meals, if  possible. Eat seafood 2 or more times a week. Have healthy snacks readily available, such as: Vegetable sticks with hummus. Greek yogurt. Fruit and nut trail mix. Eat balanced meals throughout the week. This includes: Fruit: 2-3 servings a day Vegetables: 4-5 servings a day Low-fat dairy: 2 servings a day Fish, poultry, or lean meat: 1 serving a day Beans and legumes: 2 or more servings a week Nuts and seeds: 1-2 servings a day Whole grains: 6-8 servings a day Extra-virgin olive oil: 3-4 servings a day Limit red meat and sweets to only a few servings a month What are my food choices? Mediterranean diet Recommended Grains: Whole-grain pasta. Brown rice. Bulgar wheat. Polenta. Couscous. Whole-wheat bread. [place]. Vegetables: Artichokes. Beets. Broccoli. Cabbage. Carrots. Eggplant. Green beans. Chard. Kale. Spinach. Onions. Leeks. Peas. Squash. Tomatoes. Peppers. Radishes. Fruits: Apples. Apricots. Avocado. Berries. Bananas. Cherries. Dates. Figs. Grapes. Lemons. Melon. Oranges. Peaches. Plums. Pomegranate. Meats and other protein foods: Beans. Almonds. Sunflower seeds. Pine nuts. Peanuts. Cod. Salmon. Scallops. Shrimp. Tuna. Tilapia. Clams. Oysters. Eggs. Dairy: Low-fat milk. Cheese. Greek yogurt. Beverages: Water. Red wine. Herbal tea. Fats and oils: Extra virgin olive oil. Avocado oil. Grape seed oil. Sweets and desserts: Greek yogurt with honey. Baked apples. Poached pears. Trail mix. Seasoning and other foods: Basil. Cilantro. Coriander. Cumin. Mint. Parsley. Sage. Rosemary. Tarragon. Garlic. Oregano. Thyme. Pepper. Balsalmic vinegar. Tahini. Hummus. Tomato sauce. Olives. Mushrooms. Limit these Grains: Prepackaged pasta or rice dishes. Prepackaged cereal with added sugar. Vegetables: Deep fried potatoes (french fries). Fruits: Fruit canned in syrup. Meats and other protein foods: Beef. Pork. Lamb. Poultry with skin. Hot dogs. [place]. Dairy: Ice cream. Sour cream. Whole  milk. Beverages: Juice. Sugar-sweetened soft drinks. Beer. Liquor and spirits. Fats and oils: Butter. Canola oil. Vegetable oil. Beef fat (tallow). Lard. Sweets and desserts: Cookies. Cakes. Pies. Candy. Seasoning and other foods: Mayonnaise. Premade sauces and marinades. The items listed may not be a complete list. Talk with your dietitian about what dietary choices are right for you. Summary The Mediterranean diet includes both food and lifestyle choices. Eat a variety of fresh fruits and vegetables, beans, nuts, seeds, and whole grains. Limit the amount of red meat and sweets that you eat. Talk with your health care provider about whether it is safe for you to drink red wine in moderation. This means 1 glass a day for nonpregnant women and 2 glasses a day for men. A glass of wine equals 5 oz (150 mL). This information is not intended to replace advice given to you by your health care provider. Make sure you discuss any questions you have with your health care provider. Document Released: 05/15/2016 Document Revised: 06/17/2016 Document Reviewed: 05/15/2016 Elsevier Interactive Patient Education  2017 [employer].

## 2024-09-15 ENCOUNTER — Encounter: Payer: Self-pay | Admitting: Family Medicine

## 2024-09-19 ENCOUNTER — Encounter: Payer: Self-pay | Admitting: Family Medicine

## 2024-09-19 ENCOUNTER — Ambulatory Visit: Admitting: Family Medicine

## 2024-09-19 VITALS — BP 90/60 | HR 78 | Temp 97.8°F | Ht 64.0 in | Wt 104.4 lb

## 2024-09-19 DIAGNOSIS — R413 Other amnesia: Secondary | ICD-10-CM | POA: Diagnosis not present

## 2024-09-19 DIAGNOSIS — R634 Abnormal weight loss: Secondary | ICD-10-CM

## 2024-09-19 LAB — COMPREHENSIVE METABOLIC PANEL WITH GFR
ALT: 20 U/L (ref 0–35)
AST: 28 U/L (ref 0–37)
Albumin: 4.5 g/dL (ref 3.5–5.2)
Alkaline Phosphatase: 79 U/L (ref 39–117)
BUN: 18 mg/dL (ref 6–23)
CO2: 30 meq/L (ref 19–32)
Calcium: 9.8 mg/dL (ref 8.4–10.5)
Chloride: 100 meq/L (ref 96–112)
Creatinine, Ser: 1.15 mg/dL (ref 0.40–1.20)
GFR: 42.95 mL/min — ABNORMAL LOW (ref >60.00)
Glucose, Bld: 88 mg/dL (ref 70–99)
Potassium: 4.1 meq/L (ref 3.5–5.1)
Sodium: 138 meq/L (ref 135–145)
Total Bilirubin: 1 mg/dL (ref 0.2–1.2)
Total Protein: 6.8 g/dL (ref 6.0–8.3)

## 2024-09-19 LAB — LIPID PANEL
Cholesterol: 142 mg/dL (ref 0–200)
HDL: 69.2 mg/dL (ref >39.00)
LDL Cholesterol: 55 mg/dL (ref 0–99)
NonHDL: 72.99
Total CHOL/HDL Ratio: 2
Triglycerides: 88 mg/dL (ref 0.0–149.0)
VLDL: 17.6 mg/dL (ref 0.0–40.0)

## 2024-09-19 LAB — CBC WITH DIFFERENTIAL/PLATELET
Basophils Absolute: 0 K/uL (ref 0.0–0.1)
Basophils Relative: 0.5 % (ref 0.0–3.0)
Eosinophils Absolute: 0 K/uL (ref 0.0–0.7)
Eosinophils Relative: 0.5 % (ref 0.0–5.0)
HCT: 42.1 % (ref 36.0–46.0)
Hemoglobin: 14.4 g/dL (ref 12.0–15.0)
Lymphocytes Relative: 8 % — ABNORMAL LOW (ref 12.0–46.0)
Lymphs Abs: 0.6 K/uL — ABNORMAL LOW (ref 0.7–4.0)
MCHC: 34.2 g/dL (ref 30.0–36.0)
MCV: 90.6 fl (ref 78.0–100.0)
Monocytes Absolute: 0.6 K/uL (ref 0.1–1.0)
Monocytes Relative: 7.2 % (ref 3.0–12.0)
Neutro Abs: 6.5 K/uL (ref 1.4–7.7)
Neutrophils Relative %: 83.8 % — ABNORMAL HIGH (ref 43.0–77.0)
Platelets: 209 K/uL (ref 150.0–400.0)
RBC: 4.65 Mil/uL (ref 3.87–5.11)
RDW: 13.9 % (ref 11.5–15.5)
WBC: 7.8 K/uL (ref 4.0–10.5)

## 2024-09-19 LAB — TSH: TSH: 1.3 u[IU]/mL (ref 0.35–5.50)

## 2024-09-19 LAB — VITAMIN B12: Vitamin B-12: >1500 pg/mL — ABNORMAL HIGH (ref 211–911)

## 2024-09-19 MED ORDER — MEMANTINE HCL 5 MG PO TABS: ORAL_TABLET | ORAL | Status: AC

## 2024-09-19 NOTE — Patient Instructions (Signed)
 Don't change your meds yet.  Go to the lab on the way out.   If you have mychart we'll likely use that to update you.    We can see about med options after I see your labs.  Take care.  Glad to see you.

## 2024-09-19 NOTE — Progress Notes (Unsigned)
 Lower BP and weight.  Not lightheaded.  Recheck labs pending.  Memory and anxiety discussed. Currently taking 5mg  namenda  BID with plans to inc later this month.   Appetite is lower from altered taste.  D/w pt. she has family support at home.  She can perseverate on items.  Anxiety level is higher.  Discussed.  She is concerned about her husband who has chronic medical issues.  Meds, vitals, and allergies reviewed.   ROS: Per HPI unless specifically indicated in ROS section   Nad Ncat Neck supple, no LA Rrr Ctab Abd soft. Not ttp Skin well-perfused. No tremor.

## 2024-09-21 ENCOUNTER — Ambulatory Visit: Payer: Self-pay | Admitting: Family Medicine

## 2024-09-21 MED ORDER — MIRTAZAPINE 7.5 MG PO TABS: 7.5000 mg | ORAL_TABLET | Freq: Every day | ORAL | 1 refills | Status: AC

## 2024-09-21 NOTE — Assessment & Plan Note (Signed)
 See notes on labs for reversible causes.  Discussed trying to add in protein supplements with milkshake/Ensure, etc.

## 2024-09-21 NOTE — Assessment & Plan Note (Signed)
 With anxiety likely exacerbated by memory loss.  See notes on labs.  Discussed treatment of anxiety, after I see her labs.

## 2024-09-22 ENCOUNTER — Encounter: Payer: Self-pay | Admitting: Family Medicine

## 2024-09-27 ENCOUNTER — Other Ambulatory Visit: Payer: Self-pay | Admitting: Family Medicine

## 2024-09-27 DIAGNOSIS — R413 Other amnesia: Secondary | ICD-10-CM

## 2024-09-27 MED ORDER — CYANOCOBALAMIN 500 MCG PO TABS: 500.0000 ug | ORAL_TABLET | Freq: Every day | ORAL | Status: DC

## 2024-10-04 ENCOUNTER — Ambulatory Visit

## 2024-10-04 VITALS — BP 104/60 | HR 78 | Temp 98.1°F | Ht 64.0 in | Wt 108.0 lb

## 2024-10-04 DIAGNOSIS — R8290 Unspecified abnormal findings in urine: Secondary | ICD-10-CM

## 2024-10-04 LAB — URINALYSIS, ROUTINE W REFLEX MICROSCOPIC
Hgb urine dipstick: NEGATIVE
Nitrite: NEGATIVE
RBC / HPF: NONE SEEN
Specific Gravity, Urine: 1.02 (ref 1.000–1.030)
Total Protein, Urine: 30 — AB
Urine Glucose: NEGATIVE
Urobilinogen, UA: 0.2 (ref 0.0–1.0)
pH: 6 (ref 5.0–8.0)

## 2024-10-04 LAB — POC URINALSYSI DIPSTICK (AUTOMATED)
Bilirubin, UA: NEGATIVE
Blood, UA: NEGATIVE
Glucose, UA: NEGATIVE
Ketones, UA: NEGATIVE
Nitrite, UA: NEGATIVE
Protein, UA: POSITIVE — AB
Spec Grav, UA: 1.025
Urobilinogen, UA: 0.2 U/dL
pH, UA: 6

## 2024-10-04 NOTE — Patient Instructions (Signed)
 Thank you for visiting [facility] today! Here's what we talked about: - START drinking 20-30oz of water a day - Go to ED/Call clinic if high fevers, flank pain, nausea or vomiting

## 2024-10-04 NOTE — Addendum Note (Signed)
 Addended by: KALLIE CLOTILDA SQUIBB on: 10/04/2024 09:55 AM   Modules accepted: Orders

## 2024-10-04 NOTE — Progress Notes (Signed)
 "  Subjective:   This visit was conducted in person. The patient gave informed consent to the use of Abridge AI technology to record the contents of the encounter as documented below.   Patient ID: Whitney Manning, female    DOB: 07/18/1935, 89 y.o.   MRN: [id number]   Discussed the use of AI scribe software for clinical note transcription with the patient, who gave verbal consent to proceed.  History of Present Illness Whitney Manning is an 89 year old female who presents with urinary odor.  She experiences a strong urinary odor without associated symptoms such as increased frequency, burning, urgency, or discomfort during urination. She does not report flank pain, back pain, or changes in vaginal discharge. She has not experienced fever or chills.  Her blood pressure has been low recently, but she has not experienced dizziness or lightheadedness. She started a new medication, Alan, on September 19, 2024, which coincided with her husband's hospitalization.  Her stepniece mentions that she does not drink much water, preferring 'cheer wine,' which may contribute to dehydration and the strong odor of her urine.  She has a history of saliva duct carcinoma, which has affected her ability to taste and smell, impacting her appetite. She has been encouraged to consume protein shakes daily, resulting in a weight gain of four pounds over the past two weeks.   Review of Systems  All other systems reviewed and are negative.       Allergies[1]  Medications Ordered Prior to Encounter[2]  BP 104/60 (BP Location: Right Arm, Patient Position: Sitting, Cuff Size: Normal)   Pulse 78   Temp 98.1 F (36.7 C) (Temporal)   Ht 5' 4 (1.626 m)   Wt 108 lb (49 kg)   SpO2 99%   BMI 18.54 kg/m   Objective:      Physical Exam  GENERAL: Alert, cooperative, well developed, no acute distress. HEAD: Normocephalic atraumatic. EYES: Extraocular movements intact bilaterally, pupils round, equal and  reactive to light bilaterally, conjunctivae normal bilaterally. ABDOMEN: Soft, non-tender, non-distended, without organomegaly, normal bowel sounds. No costovertebral angle tenderness. EXTREMITIES: No cyanosis or edema. NEUROLOGICAL: Oriented to person, place and time, no gait abnormalities, moves all extremities without gross motor or sensory deficit.         Assessment & Plan:    Assessment & Plan Evaluation of abnormal urine odor Abnormal urine odor without other urinary sx or vaginal sx, low suspicion for pyelo given lack of systemic sx and no CVA tenderness. Differential includes dehydration too. UA shows some leukocytes but will wait for microscopy to determine if truly infection.   - Await microscopy and culture results. - Increase water intake to 20-30 ounces per day. - Monitor for warning symptoms such as high fever, flank pain, nausea, or vomiting and seek medical attention if she occurs.     Return for worsening of symptoms or failure to improve.   Darrius Montano K Krishon Adkison, MD  10/04/2024     Contains text generated by Abridge.       [1]  Allergies Allergen Reactions   Entresto  [Sacubitril-Valsartan] Other (See Comments)    Dizziness  Lightheaded    Fosamax [Alendronate Sodium] Other (See Comments)    Epistaxis    Lopressor  [Metoprolol ] Itching  [2]  Current Outpatient Medications on File Prior to Visit  Medication Sig Dispense Refill   aspirin  EC 81 MG tablet Take 1 tablet (81 mg total) by mouth daily. Swallow whole. 90 tablet 3   atorvastatin  (LIPITOR) 40  MG tablet TAKE 1 TABLET BY MOUTH EVERY DAY 90 tablet 3   Cholecalciferol (VITAMIN D ) 2000 UNITS CAPS Take 1 capsule (2,000 Units total) by mouth daily. 30 capsule    Coenzyme Q10 (COQ10 PO) Take 1 capsule by mouth daily.     ezetimibe  (ZETIA ) 10 MG tablet TAKE 1 TABLET BY MOUTH EVERY DAY 90 tablet 0   memantine  (NAMENDA ) 5 MG tablet 5mg  2 times a day     mirtazapine  (REMERON ) 7.5 MG tablet Take 1 tablet (7.5 mg  total) by mouth at bedtime. 90 tablet 1   cyanocobalamin  (VITAMIN B12) 500 MCG tablet Take 1 tablet (500 mcg total) by mouth daily. (Patient not taking: Reported on 10/04/2024)     No current facility-administered medications on file prior to visit.   "

## 2024-10-06 LAB — URINE CULTURE
MICRO NUMBER:: 17410035
SPECIMEN QUALITY:: ADEQUATE

## 2024-10-07 ENCOUNTER — Ambulatory Visit: Payer: Self-pay

## 2024-10-10 ENCOUNTER — Encounter: Payer: Self-pay | Admitting: Cardiovascular Disease

## 2024-10-10 ENCOUNTER — Ambulatory Visit: Attending: Cardiovascular Disease | Admitting: Cardiovascular Disease

## 2024-10-10 VITALS — BP 109/63 | HR 94 | Ht 64.0 in | Wt 105.6 lb

## 2024-10-10 DIAGNOSIS — I2510 Atherosclerotic heart disease of native coronary artery without angina pectoris: Secondary | ICD-10-CM

## 2024-10-10 DIAGNOSIS — E7800 Pure hypercholesterolemia, unspecified: Secondary | ICD-10-CM | POA: Diagnosis not present

## 2024-10-10 DIAGNOSIS — I5042 Chronic combined systolic (congestive) and diastolic (congestive) heart failure: Secondary | ICD-10-CM

## 2024-10-10 DIAGNOSIS — Z950 Presence of cardiac pacemaker: Secondary | ICD-10-CM | POA: Diagnosis not present

## 2024-10-10 DIAGNOSIS — I10 Essential (primary) hypertension: Secondary | ICD-10-CM

## 2024-10-10 DIAGNOSIS — Z953 Presence of xenogenic heart valve: Secondary | ICD-10-CM

## 2024-10-10 DIAGNOSIS — Z85118 Personal history of other malignant neoplasm of bronchus and lung: Secondary | ICD-10-CM | POA: Diagnosis not present

## 2024-10-10 DIAGNOSIS — I441 Atrioventricular block, second degree: Secondary | ICD-10-CM

## 2024-10-10 NOTE — Patient Instructions (Signed)
 Medication Instructions:  No changes  *If you need a refill on your cardiac medications before your next appointment, please call your pharmacy*   Lab Work: Not needed    Testing/Procedures:  In July 2026- Your physician has requested that you have an echocardiogram. Echocardiography is a painless test that uses sound waves to create images of your heart. It provides your doctor with information about the size and shape of your heart and how well your hearts chambers and valves are working. This procedure takes approximately one hour. There are no restrictions for this procedure. Please do NOT wear cologne, perfume, aftershave, or lotions (deodorant is allowed). Please arrive 15 minutes prior to your appointment time.  Please note: We ask at that you not bring children with you during ultrasound (echo/ vascular) testing. Due to room size and safety concerns, children are not allowed in the ultrasound rooms during exams. Our front office staff cannot provide observation of children in our lobby area while testing is being conducted. An adult accompanying a patient to their appointment will only be allowed in the ultrasound room at the discretion of the ultrasound technician under special circumstances. We apologize for any inconvenience.   Follow-Up: At [facility], you and your health needs are our priority.  As part of our continuing mission to provide you with exceptional heart care, we have created designated Provider Care Teams.  These Care Teams include your primary Cardiologist (physician) and Advanced Practice Providers (APPs -  Physician Assistants and Nurse Practitioners) who all work together to provide you with the care you need, when you need it.     Your next appointment:   12 month(s)  The format for your next appointment:   In Person  Provider:   Jerel Balding, MD

## 2024-10-10 NOTE — Progress Notes (Signed)
 " Cardiology Office Note:    Date:  10/10/2024   ID:  Whitney Manning, DOB 1934-11-14, MRN [id number]  PCP:  Cleatus Arlyss RAMAN, MD   [facility] Providers Cardiologist:  Jerel Balding, MD Electrophysiologist:  Eulas FORBES Furbish, MD     Referring MD: Cleatus Arlyss RAMAN, MD   No chief complaint on file. Establish new cardiology follow-up  History of Present Illness:    Whitney Manning is a 89 y.o. female with a hx of bioprosthetic aortic valve replacement and ascending aorta and aortic arch repair (2010), CAD s/p CABG (LIMA-LAD at time of AVR). 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 Pemberton's departure.  Her husband Whitney Manning is also establishing as my patient.  She is accompanied today by her daughter-in-law, [name].   Feels great and has no physical complaints.  Hard of hearing and memory is slowly deteriorating.  Has not had any falls or bleeding problems.  She and her husband Whitney Manning are still living independently but she is challenged by her memory and he is having more more physical ailments.  Beth points out that they may need to make new living arrangements.  The patient specifically denies any chest pain at rest or with exertion, dyspnea at rest or with exertion, orthopnea, paroxysmal nocturnal dyspnea, syncope, palpitations, focal neurological deficits, intermittent claudication, lower extremity edema, unexplained weight gain, cough, hemoptysis or wheezing.  Excellent metabolic profile with HDL 69 and LDL 55.  Creatinine is 1.15  Her most recent echocardiogram in July 2024 showed LVEF 45-50% with mild global hypokinesis and indeterminant diastolic  parameters.  The right ventricle was also described as having mildly reduced systolic function and both atria are mildly dilated.  There were no other valvular abnormalities of significance.  Aortic valve gradients were normal, although they were not officially reported on the echocardiogram.  In March 2024 the mean aortic valve gradient was 3 mmHg and the dimensionless index was 0.87.  In March 2024 she had a normal nuclear stress test with an EF calculated at 58%.  She was intolerant of Entresto .  Her pacemaker is an  A[employer]ott Assurity [id number] with a Abbott Ultipace [id number] right atrial lead and a Abbott Ultipace 1231-65 right ventricular lead.  She just had an in-clinic evaluation with Dr. Furbish in November with normal findings.  She has about 57% ventricular pacing.  Generator longevity is estimated at about 9 years.   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)   CHOLECYSTECTOMY     COLONOSCOPY     LUNG LOBECTOMY  2016   at [facility], for pulmonary nodule   PACEMAKER IMPLANT N/A 04/13/2023   Procedure: PACEMAKER IMPLANT;  Surgeon: Furbish Eulas FORBES, MD;  Location: [facility] INVASIVE CV LAB;  Service: Cardiovascular;  Laterality: N/A;   salivary gland surgery  for cancer on the left parotid      Current Medications: Current Meds  Medication Sig   aspirin  EC 81 MG tablet Take 1 tablet (81 mg total) by mouth daily. Swallow whole.   atorvastatin  (LIPITOR) 40 MG tablet TAKE 1 TABLET BY MOUTH EVERY DAY   Cholecalciferol (VITAMIN D ) 2000 UNITS CAPS Take 1 capsule (2,000 Units total) by mouth daily.   Coenzyme Q10 (COQ10 PO) Take 1 capsule by mouth daily.   ezetimibe   (ZETIA ) 10 MG tablet TAKE 1 TABLET BY MOUTH EVERY DAY   memantine  (NAMENDA ) 5 MG tablet 5mg  2 times a day   mirtazapine  (REMERON ) 7.5 MG tablet Take 1 tablet (7.5 mg total) by mouth at bedtime.     Allergies:   Entresto  [sacubitril-valsartan], Fosamax [alendronate sodium], and Lopressor  [metoprolol ]       Family History: The patient's family history includes Heart disease in her father; Stroke in her mother. There is no history of Breast cancer or Colon cancer.  ROS:   Please see the history of present illness.     All other systems reviewed and are negative.  EKGs/Labs/Other Studies Reviewed:   Personally reviewed her ECG from 08/19/2024 which shows atrial sensed, ventricular paced rhythm with long AV delay with a relatively narrow paced QRS complex at 142 ms. The following studies were reviewed today: Echocardiogram July 2024:   1. Patient appears to have 2:1 AV block during exam.   2. Septal hypokinesis . Left ventricular ejection fraction, by  estimation, is 45 to 50%. The left ventricle has mildly decreased  function. The left ventricle demonstrates global hypokinesis. Left  ventricular diastolic parameters are indeterminate.   3. Right ventricular systolic function is mildly reduced. The right  ventricular size is mildly enlarged.   4. Left atrial size was mildly dilated.   5. Right atrial size was mildly dilated.   6. The mitral valve is degenerative. No evidence of mitral valve  regurgitation. No evidence of mitral stenosis. Severe mitral annular  calcification.   7. The aortic valve has been repaired/replaced. Aortic valve  regurgitation is not visualized. No aortic stenosis is present.   8. The inferior vena cava is normal in size with <50% respiratory  variability, suggesting right atrial pressure of 8 mmHg.       Recent Labs: 09/19/2024: ALT 20; BUN 18; Creatinine, Ser 1.15; Hemoglobin 14.4; Platelets 209.0; Potassium 4.1; Sodium 138; TSH 1.30  Recent Lipid  Panel    Component Value Date/Time   CHOL 142 09/19/2024 1136   CHOL 143 03/11/2023 0843   TRIG 88.0 09/19/2024 1136   TRIG 93 10/12/2006 1008   HDL 69.20 09/19/2024 1136   HDL 71 03/11/2023 0843   CHOLHDL 2 09/19/2024 1136   VLDL 17.6 09/19/2024 1136   LDLCALC 55 09/19/2024 1136   LDLCALC 58 03/11/2023 0843   LDLDIRECT 131.6 09/16/2012 1131     Risk Assessment/Calculations:             Physical Exam:    VS:  BP 109/63 (BP Location: Left Arm, Patient Position: Sitting, Cuff Size: Small)   Pulse 94   Ht 5' 4 (1.626 m)   Wt 105 lb 9.6 oz (47.9 kg)   SpO2 98%   BMI 18.13 kg/m     Wt Readings from Last 3 Encounters:  10/10/24 105 lb 9.6 oz (47.9 kg)  10/04/24 108 lb (49 kg)  09/19/24 104 lb 6 oz (47.3 kg)      General: Alert, oriented x3, no  distress, thin and petite.  Healthy left subclavian pacemaker site. Head: no evidence of trauma, PERRL, EOMI, no exophtalmos or lid lag, no myxedema, no xanthelasma; normal ears, nose and oropharynx Neck: normal jugular venous pulsations and no hepatojugular reflux; brisk carotid pulses without delay and no carotid bruits Chest: clear to auscultation, no signs of consolidation by percussion or palpation, normal fremitus, symmetrical and full respiratory excursions Cardiovascular: normal position and quality of the apical impulse, regular rhythm, normal first and second heart sounds, no murmurs, rubs or gallops Abdomen: no tenderness or distention, no masses by palpation, no abnormal pulsatility or arterial bruits, normal bowel sounds, no hepatosplenomegaly Extremities: no clubbing, cyanosis or edema; 2+ radial, ulnar and brachial pulses bilaterally; 2+ right femoral, posterior tibial and dorsalis pedis pulses; 2+ left femoral, posterior tibial and dorsalis pedis pulses; no subclavian or femoral bruits Neurological: grossly nonfocal Psych: Normal mood and affect   ASSESSMENT:    1. S/P aortic valve replacement with porcine valve    2. Coronary artery disease involving native coronary artery of native heart without angina pectoris   3. Chronic combined systolic and diastolic heart failure (HCC)   4. Second degree atrioventricular block   5. Pacemaker   6. Hypercholesterolemia   7. Essential hypertension   8. History of lung cancer     PLAN:    In order of problems listed above:  AVR: Normal prosthetic valve function.  Reminded indications for endocarditis prophylaxis.  Recheck echocardiogram in July when it will be 2 years since her last echo. CAD: Asymptomatic.  That was discovered incidentally at the time of catheterization for aortic valve stenosis.  Has a single-vessel LIMA-LAD. CHF: Minimally depressed left ventricular systolic function, likely due to abnormal electrical activation.  NYHA functional class I, clinically euvolemic. 2nd deg AV block: Roughly 50% ventricular pacing with the most recent device settings. Pacemaker: Normal pacemaker function.  Has remote downloads every 3 months and was recently evaluated in clinic by Dr. Nancey. HLP: Excellent lipid profile.  Continue current dose of atorvastatin .. HTN: Not taking any medications for blood pressure anymore. History of lung cancer: s/p lobectomy and XRT with curative intent in 2016.           Medication Adjustments/Labs and Tests Ordered: Current medicines are reviewed at length with the patient today.  Concerns regarding medicines are outlined above.  Orders Placed This Encounter  Procedures   ECHOCARDIOGRAM COMPLETE   No orders of the defined types were placed in this encounter.   Patient Instructions  Medication Instructions:  No changes  *If you need a refill on your cardiac medications before your next appointment, please call your pharmacy*   Lab Work: Not needed    Testing/Procedures:  In July 2026- Your physician has requested that you have an echocardiogram. Echocardiography is a painless test that uses sound waves to  create images of your heart. It provides your doctor with information about the size and shape of your heart and how well your hearts chambers and valves are working. This procedure takes approximately one hour. There are no restrictions for this procedure. Please do NOT wear cologne, perfume, aftershave, or lotions (deodorant is allowed). Please arrive 15 minutes prior to your appointment time.  Please note: We ask at that you not bring children with you during ultrasound (echo/ vascular) testing. Due to room size and safety concerns, children are not allowed in the ultrasound rooms during exams. Our front office staff cannot provide observation of children in our lobby area  while testing is being conducted. An adult accompanying a patient to their appointment will only be allowed in the ultrasound room at the discretion of the ultrasound technician under special circumstances. We apologize for any inconvenience.   Follow-Up: At [facility], you and your health needs are our priority.  As part of our continuing mission to provide you with exceptional heart care, we have created designated Provider Care Teams.  These Care Teams include your primary Cardiologist (physician) and Advanced Practice Providers (APPs -  Physician Assistants and Nurse Practitioners) who all work together to provide you with the care you need, when you need it.     Your next appointment:   12 month(s)  The format for your next appointment:   In Person  Provider:   Jerel Balding, MD      Signed, Jerel Balding, MD  10/10/2024 9:24 AM    [facility]  "

## 2024-10-11 ENCOUNTER — Ambulatory Visit: Payer: Medicare Other

## 2024-10-11 DIAGNOSIS — I441 Atrioventricular block, second degree: Secondary | ICD-10-CM

## 2024-10-12 LAB — CUP PACEART REMOTE DEVICE CHECK
Battery Remaining Longevity: 107 mo
Battery Remaining Percentage: 89 %
Battery Voltage: 2.99 V
Brady Statistic AP VP Percent: 5.4 %
Brady Statistic AP VS Percent: 1 %
Brady Statistic AS VP Percent: 92 %
Brady Statistic AS VS Percent: 1.8 %
Brady Statistic RA Percent Paced: 5.7 %
Brady Statistic RV Percent Paced: 98 %
Date Time Interrogation Session: 20260106020015
Implantable Lead Connection Status: 753985
Implantable Lead Connection Status: 753985
Implantable Lead Implant Date: 20240708
Implantable Lead Implant Date: 20240708
Implantable Lead Location: 753859
Implantable Lead Location: 753860
Implantable Pulse Generator Implant Date: 20240708
Lead Channel Impedance Value: 480 Ohm
Lead Channel Impedance Value: 510 Ohm
Lead Channel Pacing Threshold Amplitude: 0.75 V
Lead Channel Pacing Threshold Amplitude: 0.75 V
Lead Channel Pacing Threshold Pulse Width: 0.5 ms
Lead Channel Pacing Threshold Pulse Width: 0.5 ms
Lead Channel Sensing Intrinsic Amplitude: 11.2 mV
Lead Channel Sensing Intrinsic Amplitude: 2.6 mV
Lead Channel Setting Pacing Amplitude: 1 V
Lead Channel Setting Pacing Amplitude: 1.75 V
Lead Channel Setting Pacing Pulse Width: 0.5 ms
Lead Channel Setting Sensing Sensitivity: 2 mV
Pulse Gen Model: 2272
Pulse Gen Serial Number: 5827861

## 2024-10-13 NOTE — Progress Notes (Signed)
 Remote PPM Transmission

## 2024-10-15 ENCOUNTER — Ambulatory Visit: Payer: Self-pay | Admitting: Cardiovascular Disease

## 2024-10-18 ENCOUNTER — Other Ambulatory Visit: Payer: Self-pay | Admitting: Physician Assistant

## 2024-11-01 ENCOUNTER — Telehealth: Payer: Self-pay | Admitting: Clinical

## 2024-11-01 NOTE — Telephone Encounter (Signed)
 Beth called in wanting to speak with the social worker that use to sit in with the appts with Camie Sevin. Beth stated that Aseneth recently moved in permanently  (husband has too, not permanently yet)  and need help with dealing with her. She is requesting a call back.    PH: [phone]

## 2024-12-12 ENCOUNTER — Ambulatory Visit: Admitting: Physician Assistant

## 2024-12-29 ENCOUNTER — Encounter: Admitting: Family Medicine

## 2024-12-29 ENCOUNTER — Ambulatory Visit

## 2025-04-10 ENCOUNTER — Ambulatory Visit (HOSPITAL_COMMUNITY)
